# Patient Record
Sex: Female | Born: 1970 | Race: Black or African American | Hispanic: No | Marital: Single | Smoking: Never smoker
Health system: Southern US, Community
[De-identification: ages and names within clinical notes are randomized; demographics above are authoritative.]

## PROBLEM LIST (undated history)

## (undated) ENCOUNTER — Inpatient Hospital Stay (HOSPITAL_COMMUNITY): Payer: Self-pay

## (undated) DIAGNOSIS — J40 Bronchitis, not specified as acute or chronic: Secondary | ICD-10-CM

## (undated) DIAGNOSIS — D219 Benign neoplasm of connective and other soft tissue, unspecified: Secondary | ICD-10-CM

## (undated) DIAGNOSIS — G43909 Migraine, unspecified, not intractable, without status migrainosus: Secondary | ICD-10-CM

## (undated) DIAGNOSIS — D649 Anemia, unspecified: Secondary | ICD-10-CM

## (undated) DIAGNOSIS — M1990 Unspecified osteoarthritis, unspecified site: Secondary | ICD-10-CM

## (undated) DIAGNOSIS — I10 Essential (primary) hypertension: Secondary | ICD-10-CM

## (undated) DIAGNOSIS — IMO0002 Reserved for concepts with insufficient information to code with codable children: Secondary | ICD-10-CM

## (undated) DIAGNOSIS — K219 Gastro-esophageal reflux disease without esophagitis: Secondary | ICD-10-CM

## (undated) DIAGNOSIS — R87619 Unspecified abnormal cytological findings in specimens from cervix uteri: Secondary | ICD-10-CM

## (undated) DIAGNOSIS — J45909 Unspecified asthma, uncomplicated: Secondary | ICD-10-CM

## (undated) DIAGNOSIS — E119 Type 2 diabetes mellitus without complications: Secondary | ICD-10-CM

---

## 1998-04-04 ENCOUNTER — Emergency Department (HOSPITAL_COMMUNITY): Admission: EM | Admit: 1998-04-04 | Discharge: 1998-04-04 | Payer: Self-pay | Admitting: Emergency Medicine

## 1998-05-15 ENCOUNTER — Encounter: Admission: RE | Admit: 1998-05-15 | Discharge: 1998-08-03 | Payer: Self-pay | Admitting: *Deleted

## 1998-08-03 ENCOUNTER — Encounter: Admission: RE | Admit: 1998-08-03 | Discharge: 1998-11-01 | Payer: Self-pay | Admitting: *Deleted

## 1999-04-20 ENCOUNTER — Inpatient Hospital Stay (HOSPITAL_COMMUNITY): Admission: AD | Admit: 1999-04-20 | Discharge: 1999-04-20 | Payer: Self-pay | Admitting: *Deleted

## 1999-05-04 ENCOUNTER — Ambulatory Visit (HOSPITAL_COMMUNITY): Admission: RE | Admit: 1999-05-04 | Discharge: 1999-05-04 | Payer: Self-pay | Admitting: *Deleted

## 1999-05-19 ENCOUNTER — Inpatient Hospital Stay (HOSPITAL_COMMUNITY): Admission: AD | Admit: 1999-05-19 | Discharge: 1999-05-21 | Payer: Self-pay | Admitting: *Deleted

## 2000-10-29 ENCOUNTER — Emergency Department (HOSPITAL_COMMUNITY): Admission: EM | Admit: 2000-10-29 | Discharge: 2000-10-30 | Payer: Self-pay | Admitting: Emergency Medicine

## 2001-07-09 ENCOUNTER — Emergency Department (HOSPITAL_COMMUNITY): Admission: EM | Admit: 2001-07-09 | Discharge: 2001-07-09 | Payer: Self-pay | Admitting: Emergency Medicine

## 2001-08-14 ENCOUNTER — Emergency Department (HOSPITAL_COMMUNITY): Admission: EM | Admit: 2001-08-14 | Discharge: 2001-08-14 | Payer: Self-pay | Admitting: Emergency Medicine

## 2001-08-14 ENCOUNTER — Encounter: Payer: Self-pay | Admitting: Emergency Medicine

## 2001-09-27 ENCOUNTER — Observation Stay (HOSPITAL_COMMUNITY): Admission: EM | Admit: 2001-09-27 | Discharge: 2001-09-28 | Payer: Self-pay | Admitting: Emergency Medicine

## 2001-09-27 ENCOUNTER — Encounter: Payer: Self-pay | Admitting: Emergency Medicine

## 2001-09-27 ENCOUNTER — Encounter (INDEPENDENT_AMBULATORY_CARE_PROVIDER_SITE_OTHER): Payer: Self-pay

## 2001-09-27 HISTORY — PX: CHOLECYSTECTOMY: SHX55

## 2002-05-24 ENCOUNTER — Emergency Department (HOSPITAL_COMMUNITY): Admission: EM | Admit: 2002-05-24 | Discharge: 2002-05-24 | Payer: Self-pay | Admitting: Emergency Medicine

## 2002-07-29 ENCOUNTER — Emergency Department (HOSPITAL_COMMUNITY): Admission: EM | Admit: 2002-07-29 | Discharge: 2002-07-29 | Payer: Self-pay

## 2002-11-18 ENCOUNTER — Emergency Department (HOSPITAL_COMMUNITY): Admission: EM | Admit: 2002-11-18 | Discharge: 2002-11-18 | Payer: Self-pay | Admitting: Emergency Medicine

## 2003-01-29 ENCOUNTER — Emergency Department (HOSPITAL_COMMUNITY): Admission: EM | Admit: 2003-01-29 | Discharge: 2003-01-29 | Payer: Self-pay | Admitting: Emergency Medicine

## 2003-07-03 ENCOUNTER — Emergency Department (HOSPITAL_COMMUNITY): Admission: EM | Admit: 2003-07-03 | Discharge: 2003-07-04 | Payer: Self-pay | Admitting: Emergency Medicine

## 2003-08-01 ENCOUNTER — Emergency Department (HOSPITAL_COMMUNITY): Admission: EM | Admit: 2003-08-01 | Discharge: 2003-08-01 | Payer: Self-pay | Admitting: Emergency Medicine

## 2003-10-09 ENCOUNTER — Emergency Department (HOSPITAL_COMMUNITY): Admission: EM | Admit: 2003-10-09 | Discharge: 2003-10-09 | Payer: Self-pay | Admitting: Emergency Medicine

## 2004-05-22 ENCOUNTER — Emergency Department (HOSPITAL_COMMUNITY): Admission: EM | Admit: 2004-05-22 | Discharge: 2004-05-22 | Payer: Self-pay | Admitting: *Deleted

## 2004-06-12 ENCOUNTER — Emergency Department (HOSPITAL_COMMUNITY): Admission: EM | Admit: 2004-06-12 | Discharge: 2004-06-12 | Payer: Self-pay | Admitting: Emergency Medicine

## 2005-02-27 ENCOUNTER — Ambulatory Visit (HOSPITAL_COMMUNITY): Payer: Self-pay | Admitting: Psychiatry

## 2005-03-18 ENCOUNTER — Ambulatory Visit (HOSPITAL_COMMUNITY): Payer: Self-pay | Admitting: Psychiatry

## 2005-04-16 ENCOUNTER — Ambulatory Visit (HOSPITAL_COMMUNITY): Payer: Self-pay | Admitting: Psychiatry

## 2005-06-13 ENCOUNTER — Ambulatory Visit (HOSPITAL_COMMUNITY): Payer: Self-pay | Admitting: Psychiatry

## 2006-04-30 ENCOUNTER — Ambulatory Visit (HOSPITAL_COMMUNITY): Payer: Self-pay | Admitting: Psychiatry

## 2006-05-28 ENCOUNTER — Ambulatory Visit (HOSPITAL_COMMUNITY): Payer: Self-pay | Admitting: Psychiatry

## 2006-08-06 ENCOUNTER — Ambulatory Visit (HOSPITAL_COMMUNITY): Payer: Self-pay | Admitting: Psychiatry

## 2007-01-09 ENCOUNTER — Ambulatory Visit (HOSPITAL_COMMUNITY): Payer: Self-pay | Admitting: Psychiatry

## 2007-11-03 ENCOUNTER — Ambulatory Visit (HOSPITAL_COMMUNITY): Payer: Self-pay | Admitting: Psychiatry

## 2011-01-18 NOTE — Op Note (Signed)
[facility]  Patient:    MARIE, BOROWSKI Visit Number: [id number] MRN: [id number]          Service Type: OBV Location: 4W 0450 01 Attending Physician:  Charlton Haws Dictated by:   Currie Paris, M.D. Proc. Date: 09/27/01 Admit Date:  09/27/2001                             Operative Report  VISIT #[id number].  PREOPERATIVE DIAGNOSIS:  Acute calculus cholecystitis.  POSTOPERATIVE DIAGNOSIS:  Acute calculus cholecystitis.  OPERATION:  Laparoscopic cholecystectomy.  SURGEON:  Currie Paris, M.D.  ASSISTANT:  Sandria Bales. Ezzard Standing, M.D.  ANESTHESIA:  General endotracheal.  CLINICAL HISTORY:  This patient is a 40 year old who has presented with fairly classic symptoms of acute cholecystitis and an ultrasound showing a dilated gallbladder with multiple large stones. Liver functions were normal.  DESCRIPTION OF PROCEDURE:  The patient was brought to the operating room and after satisfactory general endotracheal anesthesia had been obtained, the abdomen was prepped and draped. I used 0.25% Marcaine in each incision site. The umbilical incision was made first and the fascia identified, opened and the peritoneal cavity entered under direct vision. A pursestring was placed and the Hasson introduced. The abdomen was insufflated to 15. The gallbladder was noted to be tense and inflamed and edematous. It was actually intrahepatic with a portion of the very dome of the gallbladder protruding through the liver with a band of liver going around the gallbladder below this. I decompressed the gallbladder, for better retraction, with a needle.  The gallbladder was retracted and I was able to dissect some omental adhesions off of the entire length of the gallbladder and get down to the triangle of Calot. I opened the triangle of Calot both anteriorly and posteriorly, dissected that out so that I could thoroughly identify the anatomy and see  the cystic artery and the cystic duct coming up and could see where it entered the gallbladder and also dissected down a little further to see the common duct area. Once the anatomy was clear and I had a good window into the triangle of Calot, I put three clips on the stay side of the cystic duct and one on the go side and divided it. The cystic artery was divided and a posterior branch clipped and divided as well. The gallbladder was removed from below to above and this was something of a struggle because of the intrahepatic nature of the gallbladder. As you go towards the dome, I had to divide the band of liver that came across the gallbladder using cautery and then killed the gallbladder off with this but eventually was able to get the gallbladder completely disconnected and placed in a bag. I then irrigated and we cauterized vigorously the area of the liver where we had to divide it as that was oozing a fair amount initially but got that all stopped without any significant blood loss. The remainder of the gallbladder bed appeared dry and we irrigated for several minutes. I went ahead at this point and brought the gallbladder up the umbilical port and had to enlarge that a little bit to get the stones to come through the port site and then reintroduce the Hasson. No blood had accumulated and everything appeared to be dry. I irrigated a final time, placed some Surgicel around the edges of the liver at the top where the bleeding had occurred and left a  19 Blake drain in case we had any subsequent bile leak or bleeding. This was brought out the lateral port.  After suctioning out the remaining irrigant, the lateral port was removed. The umbilical port was removed and the pursestring tied down. The abdomen was deflated as I removed the epigastric port. The skin was closed with 4-0 Monocryl subcuticular plus Steri-Strips. The patient tolerated the procedure well. There were no operative  complications. All counts were correct. Dictated by:   Currie Paris, M.D. Attending Physician:  Charlton Haws DD:  09/27/01 TD:  09/27/01 Job: [phone] [id number]

## 2011-01-18 NOTE — H&P (Signed)
[facility]  Patient:    Carla Brooks, EISEN Visit Number: [id number] MRN: [id number]          Service Type: OBV Location: 4W 0450 01 Attending Physician:  Charlton Haws Dictated by:   Currie Paris, M.D. Admit Date:  09/27/2001                           History and Physical  VISIT #:  [id number]  CHIEF COMPLAINT:  Abdominal pain.  HISTORY OF PRESENT ILLNESS:  Carla Brooks is a 40 year old lady who presented with acute abdominal pain early this morning.  She had eaten some Beefaroni around 8-9 oclock and about two hours later developed epigastric right upper quadrant pain, nausea, vomiting, and the pain has continued all night.  She had a similar episode two to three days earlier which resolved without incident.  She has never had any other significant GI complaints.  She has otherwise been in generally good health.  MEDICATIONS:  Albuterol inhalers.  ALLERGIES:  IBUPROFEN sensitivity because it caused an ulcer.  HABITS:  Cigarettes, none.  Alcohol, none.  FAMILY HISTORY:  Unremarkable.  REVIEW OF SYSTEMS:  HEENT:  Negative.  CHEST:  Has a history of bronchitis, using inhalers.  HEART:  No history of hypertension, etc.  ABDOMEN:  Negative except for HPI.  GENITOURINARY:  Negative.  Has had two children, ages 22 and 2 currently.  PHYSICAL EXAMINATION:  GENERAL:  Generally healthy but somewhat uncomfortable-appearing female.  HEENT:  Head normocephalic.  Eyes nonicteric.  Pupils round and regular.  NECK:  Supple.  No masses or thyromegaly.  LUNGS:  Clear to auscultation.  HEART:  Regular.  No murmurs, rubs, or gallops.  ABDOMEN:  Soft generally, but markedly tender in the right upper quadrant, with no rebound tenderness noted.  Bowel sounds are present.  EXTREMITIES:  No cyanosis or edema.  LABORATORY DATA:  Gallbladder ultrasound shows large gallstones, dilated gallbladder.  Other laboratory studies show a negative urine  pregnancy test.  Clear urine, although her specific gravity is 1.040, indicating some hemoconcentration. Her white count is 14.5, with a hemoglobin of 10.6.  Amylase is normal at 76. Liver functions and electrolytes are also normal.  Lipase is normal at 23.  IMPRESSION:  Acute cholecystitis with gallstones.  RECOMMENDATIONS:  We are going to go ahead and give her some antibiotics, but we need to, I believe, proceed to laparoscopic cholecystectomy with possible open, depending on the findings.  I have gone over this with the patient and her mother.  All questions have been answered, and we will go ahead and try to get her scheduled this morning. Dictated by:   Currie Paris, M.D. Attending Physician:  Charlton Haws DD:  09/27/01 TD:  09/27/01 Job: [id number]

## 2011-10-03 ENCOUNTER — Emergency Department (HOSPITAL_COMMUNITY): Payer: Medicaid Other

## 2011-10-03 ENCOUNTER — Encounter (HOSPITAL_COMMUNITY): Payer: Self-pay | Admitting: *Deleted

## 2011-10-03 ENCOUNTER — Emergency Department (HOSPITAL_COMMUNITY)
Admission: EM | Admit: 2011-10-03 | Discharge: 2011-10-03 | Disposition: A | Discharge status: Home / Self Care | Payer: Medicaid Other | Attending: Emergency Medicine | Admitting: Emergency Medicine

## 2011-10-03 ENCOUNTER — Other Ambulatory Visit: Payer: Self-pay

## 2011-10-03 DIAGNOSIS — R0602 Shortness of breath: Secondary | ICD-10-CM | POA: Insufficient documentation

## 2011-10-03 DIAGNOSIS — R05 Cough: Secondary | ICD-10-CM | POA: Insufficient documentation

## 2011-10-03 DIAGNOSIS — R079 Chest pain, unspecified: Secondary | ICD-10-CM | POA: Insufficient documentation

## 2011-10-03 DIAGNOSIS — I10 Essential (primary) hypertension: Secondary | ICD-10-CM | POA: Insufficient documentation

## 2011-10-03 DIAGNOSIS — R059 Cough, unspecified: Secondary | ICD-10-CM | POA: Insufficient documentation

## 2011-10-03 DIAGNOSIS — E119 Type 2 diabetes mellitus without complications: Secondary | ICD-10-CM | POA: Insufficient documentation

## 2011-10-03 DIAGNOSIS — R0789 Other chest pain: Secondary | ICD-10-CM

## 2011-10-03 HISTORY — DX: Bronchitis, not specified as acute or chronic: J40

## 2011-10-03 HISTORY — DX: Essential (primary) hypertension: I10

## 2011-10-03 LAB — POCT I-STAT, CHEM 8
Calcium, Ion: 1.15 mmol/L (ref 1.12–1.32)
Glucose, Bld: 91 mg/dL (ref 70–99)
HCT: 30 % — ABNORMAL LOW (ref 36.0–46.0)
Hemoglobin: 10.2 g/dL — ABNORMAL LOW (ref 12.0–15.0)
Potassium: 3.5 mEq/L (ref 3.5–5.1)
TCO2: 23 mmol/L (ref 0–100)

## 2011-10-03 LAB — POCT I-STAT TROPONIN I: Troponin i, poc: 0 ng/mL (ref 0.00–0.08)

## 2011-10-03 LAB — D-DIMER, QUANTITATIVE: D-Dimer, Quant: 0.33 ug/mL-FEU (ref 0.00–0.48)

## 2011-10-03 NOTE — ED Provider Notes (Addendum)
History     CSN: [id number]  Arrival date & time 10/03/11  1726   First MD Initiated Contact with Patient 10/03/11 1743      Chief Complaint  Patient presents with  . Chest Pain    (Consider location/radiation/quality/duration/timing/severity/associated sxs/prior treatment) HPI Complains of anterior chest pain onset 2 days ago, improved by taking deep breath intermittent lasting 2 minutes at a time not made worse by exertion. Admits to mild shortness of breath denies nausea or sweatiness seen [facility] sent here for further evaluation. No treatment prior to coming here. No other associated symptoms. Symptoms are relieved by sitting up made worse by lying supine. Admits to mild cough no fever no other complaint Past Medical History  Diagnosis Date  . Diabetes mellitus   . Hypertension   . Bronchitis    cardiac risk factors diabetes hypertension otherwise negative  Past Surgical History  Procedure Date  . Cholecystectomy     No family history on file.  History  Substance Use Topics  . Smoking status: Never Smoker   . Smokeless tobacco: Not on file  . Alcohol Use: No    OB History    Grav Para Term Preterm Abortions TAB SAB Ect Mult Living                  Review of Systems  Constitutional: Negative.   HENT: Negative.   Respiratory: Positive for cough and shortness of breath.   Cardiovascular: Positive for chest pain.  Gastrointestinal: Negative.   Musculoskeletal: Negative.   Skin: Negative.   Neurological: Negative.   Hematological: Negative.   Psychiatric/Behavioral: Negative.     Allergies  Penicillins  Home Medications   Current Outpatient Rx  Name Route Sig Dispense Refill  . DIVALPROEX SODIUM 250 MG PO TBEC Oral Take 500 mg by mouth every evening.     Marland Kitchen LISINOPRIL 10 MG PO TABS Oral Take 10 mg by mouth every evening.    Marland Kitchen METFORMIN HCL 500 MG PO TABS Oral Take 1,000 mg by mouth 2 (two) times daily with a meal.     .  SERTRALINE HCL 100 MG PO TABS Oral Take 100 mg by mouth every evening.    . TOPIRAMATE 100 MG PO TABS Oral Take 100 mg by mouth every evening.    . TRAZODONE HCL 100 MG PO TABS Oral Take 100 mg by mouth at bedtime.      BP 171/85  Temp(Src) 98.4 F (36.9 C) (Oral)  Resp 15  Ht 5\' 9"  (1.753 m)  Wt 218 lb (98.884 kg)  BMI 32.19 kg/m2  SpO2 100%  LMP 09/26/2011  Physical Exam  Nursing note and vitals reviewed. Constitutional: She appears well-developed and well-nourished.  HENT:  Head: Normocephalic and atraumatic.  Eyes: Conjunctivae are normal. Pupils are equal, round, and reactive to light.  Neck: Neck supple. No tracheal deviation present. No thyromegaly present.  Cardiovascular: Normal rate, regular rhythm and normal heart sounds.  Exam reveals no friction rub.   No murmur heard. Pulmonary/Chest: Effort normal and breath sounds normal.  Abdominal: Soft. Bowel sounds are normal. She exhibits no distension. There is no tenderness.       Mildly obese  Musculoskeletal: Normal range of motion. She exhibits no edema and no tenderness.  Neurological: She is alert. Coordination normal.  Skin: Skin is warm and dry. No rash noted.  Psychiatric: She has a normal mood and affect.    ED Course  Procedures (including critical care  time) 11 PM patient resting comfortably asymptomatic [facility] will evaluate patient in the office as an outpatient Labs Reviewed - No data to display No results found.  Date: 10/03/2011  Rate: 75  Rhythm: normal sinus rhythm  QRS Axis: normal  Intervals: normal  ST/T Wave abnormalities: normal  Conduction Disutrbances:none  Narrative Interpretation:   Old EKG Reviewed: unchanged No significant change from EKG performed today at 1659 pm  No diagnosis found.    MDM  Feel the patient is very low risk for acute coronary syndrome demonstrating female normal cardiac markers x2 sets highly atypical symptoms. Low pretest  probability for pulmonary embolism, negative d-dimer Diagnosis #1 atypical chest pain #2 hypertension        Doug Sou, MD 10/03/11 1610  Doug Sou, MD 10/03/11 2307

## 2011-10-03 NOTE — ED Notes (Signed)
Pt reports central chest pain x1 week intermittently. Feels like pressure. Reports associated feeling of "having the wind knocked out of me." Denies n/v/diaphoresis. Seen by Mila Palmer, MD at [facility] today and sent here for eval of ekg changes. No chest pain presently. When pain comes rates 8/10, sts relief after a few minutes after standing up. Pain only happens when sitting or lying down. Pt sts needing 2 pillows propped up in order to sleep at night.

## 2011-10-09 ENCOUNTER — Emergency Department (HOSPITAL_COMMUNITY)
Admission: EM | Admit: 2011-10-09 | Discharge: 2011-10-09 | Disposition: A | Discharge status: Home / Self Care | Payer: Medicaid Other | Attending: Emergency Medicine | Admitting: Emergency Medicine

## 2011-10-09 ENCOUNTER — Other Ambulatory Visit: Payer: Self-pay

## 2011-10-09 ENCOUNTER — Encounter (HOSPITAL_COMMUNITY): Payer: Self-pay

## 2011-10-09 ENCOUNTER — Emergency Department (HOSPITAL_COMMUNITY): Payer: Medicaid Other

## 2011-10-09 DIAGNOSIS — E119 Type 2 diabetes mellitus without complications: Secondary | ICD-10-CM | POA: Insufficient documentation

## 2011-10-09 DIAGNOSIS — R0602 Shortness of breath: Secondary | ICD-10-CM | POA: Insufficient documentation

## 2011-10-09 DIAGNOSIS — R61 Generalized hyperhidrosis: Secondary | ICD-10-CM | POA: Insufficient documentation

## 2011-10-09 DIAGNOSIS — R05 Cough: Secondary | ICD-10-CM | POA: Insufficient documentation

## 2011-10-09 DIAGNOSIS — R079 Chest pain, unspecified: Secondary | ICD-10-CM | POA: Insufficient documentation

## 2011-10-09 DIAGNOSIS — R059 Cough, unspecified: Secondary | ICD-10-CM | POA: Insufficient documentation

## 2011-10-09 DIAGNOSIS — M546 Pain in thoracic spine: Secondary | ICD-10-CM | POA: Insufficient documentation

## 2011-10-09 DIAGNOSIS — I10 Essential (primary) hypertension: Secondary | ICD-10-CM | POA: Insufficient documentation

## 2011-10-09 LAB — CBC
HCT: 27.9 % — ABNORMAL LOW (ref 36.0–46.0)
Hemoglobin: 9.2 g/dL — ABNORMAL LOW (ref 12.0–15.0)
MCH: 29 pg (ref 26.0–34.0)
MCHC: 33 g/dL (ref 30.0–36.0)
MCV: 88 fL (ref 78.0–100.0)

## 2011-10-09 LAB — BASIC METABOLIC PANEL
BUN: 8 mg/dL (ref 6–23)
Creatinine, Ser: 0.77 mg/dL (ref 0.50–1.10)
GFR calc Af Amer: >90 mL/min (ref >90)
GFR calc non Af Amer: >90 mL/min (ref >90)
Glucose, Bld: 93 mg/dL (ref 70–99)

## 2011-10-09 LAB — DIFFERENTIAL
Basophils Relative: 0 % (ref 0–1)
Eosinophils Absolute: 0.1 10*3/uL (ref 0.0–0.7)
Eosinophils Relative: 1 % (ref 0–5)
Monocytes Absolute: 0.7 10*3/uL (ref 0.1–1.0)
Monocytes Relative: 9 % (ref 3–12)

## 2011-10-09 MED ORDER — IBUPROFEN 600 MG PO TABS: 600.0000 mg | ORAL_TABLET | Freq: Four times a day (QID) | ORAL | Status: AC

## 2011-10-09 MED ORDER — KETOROLAC TROMETHAMINE 30 MG/ML IJ SOLN
30.0000 mg | Freq: Once | INTRAMUSCULAR | Status: AC
  Administered 2011-10-09: 30 mg via INTRAVENOUS
  Filled 2011-10-09: qty 1

## 2011-10-09 MED ORDER — OMEPRAZOLE 20 MG PO CPDR: 20.0000 mg | DELAYED_RELEASE_CAPSULE | Freq: Every day | ORAL | Status: DC

## 2011-10-09 MED ORDER — GI COCKTAIL ~~LOC~~
30.0000 mL | Freq: Once | ORAL | Status: AC
  Administered 2011-10-09: 30 mL via ORAL
  Filled 2011-10-09 (×2): qty 30

## 2011-10-09 MED ORDER — AZITHROMYCIN 250 MG PO TABS: 250.0000 mg | ORAL_TABLET | Freq: Every day | ORAL | Status: AC

## 2011-10-09 NOTE — ED Notes (Signed)
EKG done on arrival and given to Dr Rancour with old EKG. 

## 2011-10-09 NOTE — ED Provider Notes (Signed)
History     CSN: [id number]  Arrival date & time 10/09/11  1925   First MD Initiated Contact with Patient 10/09/11 1929      Chief Complaint  Patient presents with  . Chest Pain    (Consider location/radiation/quality/duration/timing/severity/associated sxs/prior treatment) Patient is a 41 y.o. female presenting with chest pain. The history is provided by the patient.  Chest Pain Episode onset: 2 weeks ago. Episode Length: initially lasted several minutes at a time, but today has been constant. Chest pain occurs constantly. The chest pain is worsening. The pain is associated with breathing. The pain is currently at 9/10. The severity of the pain is severe. The quality of the pain is described as pressure-like. The pain radiates to the upper back. Chest pain is worsened by certain positions and exertion (laying flat). Primary symptoms include shortness of breath and cough. Pertinent negatives for primary symptoms include no fever, no abdominal pain, no nausea and no vomiting.  The shortness of breath developed with exertion.  Associated symptoms include diaphoresis.  Pertinent negatives for associated symptoms include no lower extremity edema.  Her past medical history is significant for diabetes and hypertension.  Pertinent negatives for past medical history include no DVT and no PE.     Past Medical History  Diagnosis Date  . Diabetes mellitus   . Hypertension   . Bronchitis     Past Surgical History  Procedure Date  . Cholecystectomy     History reviewed. No pertinent family history.  History  Substance Use Topics  . Smoking status: Never Smoker   . Smokeless tobacco: Not on file  . Alcohol Use: No    OB History    Grav Para Term Preterm Abortions TAB SAB Ect Mult Living                  Review of Systems  Constitutional: Positive for chills and diaphoresis. Negative for fever.  HENT: Positive for congestion and sore throat.   Respiratory: Positive for cough  and shortness of breath.   Cardiovascular: Positive for chest pain.  Gastrointestinal: Negative for nausea, vomiting, abdominal pain and diarrhea.  Genitourinary: Negative for difficulty urinating.  All other systems reviewed and are negative.    Allergies  Penicillins  Home Medications   Current Outpatient Rx  Name Route Sig Dispense Refill  . ASPIRIN-ACETAMINOPHEN-CAFFEINE 250-250-65 MG PO TABS Oral Take 1 tablet by mouth every 6 (six) hours as needed. For migraines    . DIVALPROEX SODIUM ER 500 MG PO TB24 Oral Take 1,000 mg by mouth at bedtime.    Marland Kitchen LISINOPRIL 10 MG PO TABS Oral Take 10 mg by mouth every evening.    Marland Kitchen METFORMIN HCL 500 MG PO TABS Oral Take 2,000 mg by mouth at bedtime.     . SERTRALINE HCL 100 MG PO TABS Oral Take 150 mg by mouth every evening.     . TOPIRAMATE 100 MG PO TABS Oral Take 100 mg by mouth every evening.    . TRAZODONE HCL 100 MG PO TABS Oral Take 25-100 mg by mouth 2 (two) times daily as needed. Take .25 tablet daily for anxiety and 0.5-1 tablet at bedtime      BP 158/92  Pulse 110  Temp(Src) 100.9 F (38.3 C) (Oral)  Resp 22  SpO2 99%  LMP 09/26/2011  Physical Exam  Nursing note and vitals reviewed. Constitutional: She is oriented to person, place, and time. She appears well-developed and well-nourished. No distress.  HENT:  Head: Normocephalic and atraumatic.  Mouth/Throat: Oropharynx is clear and moist.  Eyes: Conjunctivae are normal. Pupils are equal, round, and reactive to light. No scleral icterus.  Neck: Neck supple.  Cardiovascular: Normal rate, regular rhythm, normal heart sounds and intact distal pulses.   No murmur heard. Pulmonary/Chest: Effort normal and breath sounds normal. No stridor. No respiratory distress. She has no rales.  Abdominal: Soft. Bowel sounds are normal. She exhibits no distension. There is no tenderness.  Musculoskeletal: Normal range of motion. She exhibits no edema and no tenderness.  Neurological: She is  alert and oriented to person, place, and time.  Skin: Skin is warm and dry. No rash noted.  Psychiatric: She has a normal mood and affect. Her behavior is normal.    ED Course  Procedures (including critical care time)  Labs Reviewed  CBC - Abnormal; Notable for the following:    RBC 3.17 (*)    Hemoglobin 9.2 (*)    HCT 27.9 (*)    All other components within normal limits  BASIC METABOLIC PANEL - Abnormal; Notable for the following:    Potassium 3.3 (*)    All other components within normal limits  DIFFERENTIAL  D-DIMER, QUANTITATIVE  TROPONIN I   Dg Chest 2 View  10/09/2011  *RADIOLOGY REPORT*  Clinical Data: Chest pain, shortness of breath  CHEST - 2 VIEW  Comparison: 10/03/2011  Findings: Vascular clips in the right upper abdomen. Lungs clear. Heart size and pulmonary vascularity normal.  No effusion. Visualized bones unremarkable.  IMPRESSION: No acute disease  Original Report Authenticated By: Osa Craver, M.D.    Date: 10/10/2011  Rate: 100  Rhythm: normal sinus rhythm  QRS Axis: normal  Intervals: normal  ST/T Wave abnormalities: normal  Conduction Disutrbances:none  Narrative Interpretation:   Old EKG Reviewed: unchanged     1. Chest pain       MDM  41 yo female with onset of chest pain two weeks ago.  Initially was intermittent, today is constant.  Worse with laying flat, better sitting up.  Associated with shortness of breath and DOE.  VSS, well appearing.  She also developed nasal congestion and sore throat since yesterday.  Is febrile on arrival, likely from newly developed URI.  She endorses a cough for the past month, without hemoptysis.  She was evaluated for her chest pain one week ago at [facility], at which time she had two negative sets of cardiac enzymes and a negative d-dimer.  At that time, it was felt that ACS and PE were both very unlikely.  D-dimer repeated and again negative.  Feel that ACS and PE very unlikely.  Story and exam not  consistent with dissection.  Bedside ultrasound revealed no pericardial effusion.  Given fever and cough for past month, have prescribed azithromycin for likely bronchitis.  DC'd home with PCP follow up.          Warnell Forester, MD 10/10/11 [place]

## 2011-10-09 NOTE — ED Notes (Addendum)
Pt is here for chest pains that started last Thursday. This pain radiates to her upper back. Pt is also co some sob and nausea with this pain. This is worse today than before. Pt pain is 8/10. Was given asa 324 and 2 nitros and pain is now 6/10. Pain is described as pressure/heaviness.  #20 in L ac.

## 2011-10-10 NOTE — ED Provider Notes (Signed)
I saw and evaluated the patient, reviewed the resident's note and I agree with the findings and plan.  Intermittent substernal chest pain for the past 2 weeks. Constant pain today it worsens with lying flat. Seen at [facility] on January 31 with similar symptoms.  No SOB or cough.  Febrile here.  Lungs clear.  Ddimer neg.   Glynn Octave, MD 10/10/11 [phone]

## 2011-10-11 ENCOUNTER — Encounter: Payer: Medicaid Other | Admitting: Physician Assistant

## 2011-11-15 ENCOUNTER — Emergency Department (HOSPITAL_COMMUNITY)
Admission: EM | Admit: 2011-11-15 | Discharge: 2011-11-15 | Disposition: A | Discharge status: Home / Self Care | Payer: Medicaid Other | Attending: Emergency Medicine | Admitting: Emergency Medicine

## 2011-11-15 ENCOUNTER — Emergency Department (HOSPITAL_COMMUNITY): Payer: Medicaid Other

## 2011-11-15 ENCOUNTER — Encounter (HOSPITAL_COMMUNITY): Payer: Self-pay | Admitting: Emergency Medicine

## 2011-11-15 DIAGNOSIS — K219 Gastro-esophageal reflux disease without esophagitis: Secondary | ICD-10-CM | POA: Insufficient documentation

## 2011-11-15 DIAGNOSIS — I10 Essential (primary) hypertension: Secondary | ICD-10-CM | POA: Insufficient documentation

## 2011-11-15 DIAGNOSIS — R059 Cough, unspecified: Secondary | ICD-10-CM | POA: Insufficient documentation

## 2011-11-15 DIAGNOSIS — E860 Dehydration: Secondary | ICD-10-CM | POA: Insufficient documentation

## 2011-11-15 DIAGNOSIS — R05 Cough: Secondary | ICD-10-CM | POA: Insufficient documentation

## 2011-11-15 DIAGNOSIS — R6883 Chills (without fever): Secondary | ICD-10-CM | POA: Insufficient documentation

## 2011-11-15 DIAGNOSIS — Z794 Long term (current) use of insulin: Secondary | ICD-10-CM | POA: Insufficient documentation

## 2011-11-15 DIAGNOSIS — M129 Arthropathy, unspecified: Secondary | ICD-10-CM | POA: Insufficient documentation

## 2011-11-15 DIAGNOSIS — R197 Diarrhea, unspecified: Secondary | ICD-10-CM | POA: Insufficient documentation

## 2011-11-15 DIAGNOSIS — Z79899 Other long term (current) drug therapy: Secondary | ICD-10-CM | POA: Insufficient documentation

## 2011-11-15 DIAGNOSIS — K529 Noninfective gastroenteritis and colitis, unspecified: Secondary | ICD-10-CM

## 2011-11-15 DIAGNOSIS — R509 Fever, unspecified: Secondary | ICD-10-CM | POA: Insufficient documentation

## 2011-11-15 DIAGNOSIS — R10819 Abdominal tenderness, unspecified site: Secondary | ICD-10-CM | POA: Insufficient documentation

## 2011-11-15 DIAGNOSIS — R5381 Other malaise: Secondary | ICD-10-CM | POA: Insufficient documentation

## 2011-11-15 DIAGNOSIS — R112 Nausea with vomiting, unspecified: Secondary | ICD-10-CM | POA: Insufficient documentation

## 2011-11-15 DIAGNOSIS — K5289 Other specified noninfective gastroenteritis and colitis: Secondary | ICD-10-CM | POA: Insufficient documentation

## 2011-11-15 DIAGNOSIS — R1110 Vomiting, unspecified: Secondary | ICD-10-CM | POA: Insufficient documentation

## 2011-11-15 DIAGNOSIS — E119 Type 2 diabetes mellitus without complications: Secondary | ICD-10-CM | POA: Insufficient documentation

## 2011-11-15 HISTORY — DX: Gastro-esophageal reflux disease without esophagitis: K21.9

## 2011-11-15 HISTORY — DX: Unspecified osteoarthritis, unspecified site: M19.90

## 2011-11-15 LAB — URINALYSIS, ROUTINE W REFLEX MICROSCOPIC
Glucose, UA: NEGATIVE mg/dL
Protein, ur: 100 mg/dL — AB
Urobilinogen, UA: 0.2 mg/dL (ref 0.0–1.0)

## 2011-11-15 LAB — DIFFERENTIAL
Basophils Absolute: 0 10*3/uL (ref 0.0–0.1)
Basophils Relative: 0 % (ref 0–1)
Eosinophils Absolute: 0.1 10*3/uL (ref 0.0–0.7)
Eosinophils Relative: 0 % (ref 0–5)
Monocytes Absolute: 1 10*3/uL (ref 0.1–1.0)

## 2011-11-15 LAB — CBC
HCT: 35.9 % — ABNORMAL LOW (ref 36.0–46.0)
MCH: 27.7 pg (ref 26.0–34.0)
MCHC: 32.6 g/dL (ref 30.0–36.0)
MCV: 84.9 fL (ref 78.0–100.0)
RDW: 13.9 % (ref 11.5–15.5)

## 2011-11-15 LAB — POCT I-STAT, CHEM 8
BUN: 26 mg/dL — ABNORMAL HIGH (ref 6–23)
Calcium, Ion: 1.19 mmol/L (ref 1.12–1.32)
Chloride: 112 mEq/L (ref 96–112)
Creatinine, Ser: 1.4 mg/dL — ABNORMAL HIGH (ref 0.50–1.10)
Glucose, Bld: 84 mg/dL (ref 70–99)
TCO2: 19 mmol/L (ref 0–100)

## 2011-11-15 LAB — URINE MICROSCOPIC-ADD ON

## 2011-11-15 LAB — COMPREHENSIVE METABOLIC PANEL
AST: 25 U/L (ref 0–37)
CO2: 17 mEq/L — ABNORMAL LOW (ref 19–32)
Calcium: 10 mg/dL (ref 8.4–10.5)
Creatinine, Ser: 1.74 mg/dL — ABNORMAL HIGH (ref 0.50–1.10)
GFR calc non Af Amer: 36 mL/min — ABNORMAL LOW (ref >90)
Total Protein: 9 g/dL — ABNORMAL HIGH (ref 6.0–8.3)

## 2011-11-15 LAB — PREGNANCY, URINE: Preg Test, Ur: NEGATIVE

## 2011-11-15 MED ORDER — ONDANSETRON HCL 4 MG/2ML IJ SOLN
4.0000 mg | Freq: Once | INTRAMUSCULAR | Status: AC
  Administered 2011-11-15: 4 mg via INTRAVENOUS
  Filled 2011-11-15: qty 2

## 2011-11-15 MED ORDER — SODIUM CHLORIDE 0.9 % IV BOLUS (SEPSIS)
1000.0000 mL | Freq: Once | INTRAVENOUS | Status: AC
  Administered 2011-11-15: 1000 mL via INTRAVENOUS

## 2011-11-15 MED ORDER — ONDANSETRON HCL 8 MG PO TABS: 8.0000 mg | ORAL_TABLET | Freq: Three times a day (TID) | ORAL | Status: AC | PRN

## 2011-11-15 MED ORDER — POTASSIUM CHLORIDE 10 MEQ/100ML IV SOLN
10.0000 meq | Freq: Once | INTRAVENOUS | Status: AC
  Administered 2011-11-15: 10 meq via INTRAVENOUS
  Filled 2011-11-15: qty 100

## 2011-11-15 MED ORDER — ONDANSETRON HCL 4 MG/2ML IJ SOLN: 4.0000 mg | Freq: Four times a day (QID) | INTRAMUSCULAR | Status: DC | PRN

## 2011-11-15 MED ORDER — MORPHINE SULFATE 2 MG/ML IJ SOLN: 2.0000 mg | INTRAMUSCULAR | Status: DC | PRN

## 2011-11-15 MED ORDER — ALBUTEROL SULFATE (5 MG/ML) 0.5% IN NEBU
5.0000 mg | INHALATION_SOLUTION | Freq: Once | RESPIRATORY_TRACT | Status: AC
  Administered 2011-11-15: 5 mg via RESPIRATORY_TRACT
  Filled 2011-11-15: qty 1

## 2011-11-15 MED ORDER — POTASSIUM CHLORIDE CRYS ER 20 MEQ PO TBCR
40.0000 meq | EXTENDED_RELEASE_TABLET | Freq: Once | ORAL | Status: AC
  Administered 2011-11-15: 40 meq via ORAL
  Filled 2011-11-15: qty 2

## 2011-11-15 MED ORDER — POTASSIUM CHLORIDE CRYS ER 20 MEQ PO TBCR: 20.0000 meq | EXTENDED_RELEASE_TABLET | Freq: Two times a day (BID) | ORAL | Status: DC

## 2011-11-15 MED ORDER — SODIUM CHLORIDE 0.9 % IV SOLN
INTRAVENOUS | Status: DC
  Administered 2011-11-15 (×3): via INTRAVENOUS

## 2011-11-15 MED ORDER — HYDROCODONE-ACETAMINOPHEN 5-325 MG PO TABS: 1.0000 | ORAL_TABLET | ORAL | Status: AC | PRN

## 2011-11-15 MED ORDER — IPRATROPIUM BROMIDE 0.02 % IN SOLN
0.5000 mg | Freq: Once | RESPIRATORY_TRACT | Status: AC
  Administered 2011-11-15: 0.5 mg via RESPIRATORY_TRACT
  Filled 2011-11-15: qty 2.5

## 2011-11-15 NOTE — ED Notes (Signed)
Unable to ortho vitalsigns  Standing pt. Is to weak to stand.very dizzy

## 2011-11-15 NOTE — ED Provider Notes (Signed)
Pt on CDU dehydration protocol.  Currently asymptomatic w/ exception of feeling a little shaky.  VSS, heart w/ RRR, lungs clear, abd benign but diffusely, moderately tender.  Will recheck orthostatics, I-Stat Chem and po challenge.  4:44 PM   Pt is no longer orthostatic.  She is tolerating po fluids.  BUN and Cr are improved.  Potassium stable at 3.1.  Will give a second dose of IV KCl + 40po and then d/c home w/ potassium, hydrocodone and zofran.  Return precautions discussed. 5:57 PM   Otilio Miu, PA 11/16/11 1002

## 2011-11-15 NOTE — ED Provider Notes (Signed)
Pt to the CDU on dehydration protocol. Pt with recent pneumonia from which she has recovered with onset N/V/D and generalized abd pain in the last week. BUN and SCr elevated. Mild hypokalemia on BMP.  On my assessment, the patient is awake, alert, oriented and in no distress. Mucous membranes are moist. Lungs are clear to auscultation bilaterally. Heart regular rate and rhythm. Abdomen is soft, mildly tender periumbilical region with normal bowel sounds. Moves all extremity as well. Normal spoken voice.  Additional fluid bolus is ordered. Patient is attempting ice chips at this time. We'll continue to monitor.    3:58 PM Report given to PA schinlever, who will continue to monitor.  Shaaron Adler, [place] 11/15/11 1559

## 2011-11-15 NOTE — ED Notes (Signed)
Patient declined in and out cath, is willing to give another specimen

## 2011-11-15 NOTE — ED Notes (Signed)
Family at bedside. 

## 2011-11-15 NOTE — ED Provider Notes (Signed)
History     CSN: [id number]  Arrival date & time 11/15/11  4098   First MD Initiated Contact with Patient 11/15/11 0825      Chief Complaint  Patient presents with  . Emesis    (Consider location/radiation/quality/duration/timing/severity/associated sxs/prior treatment) Patient is a 41 y.o. female presenting with vomiting. The history is provided by the patient.  Emesis  This is a new problem. The current episode started more than 1 week ago. The problem occurs 5 to 10 times per day. The problem has not changed since onset.The emesis has an appearance of stomach contents. Associated symptoms include chills, cough, diarrhea and a fever. Pertinent negatives include no abdominal pain.  Pt states she has had cough for over a month. Diagnosed with pneumonia. Finished her antibiotic. Still coughing, went to UC 1week ago, was told she still had pneumonia, but was told she did not need any more antibiotics, and was told to just take robitussin, according to her. She states about the same time, she began having nausea, vomiting, diarrhea that still persists. Worsened yesterday. Pt states she is unable to keep anything down since yesterday.  Reports pain to the upper mid abdomen, that she describes as "a knot." States she cant keep any of her medications down either. Reports subjective fevers, chills.   Past Medical History  Diagnosis Date  . Diabetes mellitus   . Hypertension   . Bronchitis   . Arthritis   . GERD (gastroesophageal reflux disease)     Past Surgical History  Procedure Date  . Cholecystectomy     History reviewed. No pertinent family history.  History  Substance Use Topics  . Smoking status: Never Smoker   . Smokeless tobacco: Not on file  . Alcohol Use: No    OB History    Grav Para Term Preterm Abortions TAB SAB Ect Mult Living                  Review of Systems  Constitutional: Positive for fever, chills and fatigue.  HENT: Negative for congestion, sore  throat, neck pain and neck stiffness.   Eyes: Negative.   Respiratory: Positive for cough and shortness of breath. Negative for wheezing.   Cardiovascular: Negative for chest pain, palpitations and leg swelling.  Gastrointestinal: Positive for nausea, vomiting and diarrhea. Negative for abdominal pain and blood in stool.  Genitourinary: Negative.   Musculoskeletal: Negative.   Skin: Negative.   Neurological: Negative.   Psychiatric/Behavioral: Negative.     Allergies  Penicillins  Home Medications   Current Outpatient Rx  Name Route Sig Dispense Refill  . ASPIRIN-ACETAMINOPHEN-CAFFEINE 250-250-65 MG PO TABS Oral Take 1 tablet by mouth every 6 (six) hours as needed. For migraines    . DIVALPROEX SODIUM ER 500 MG PO TB24 Oral Take 1,000 mg by mouth at bedtime.    . IBUPROFEN 600 MG PO TABS Oral Take 600 mg by mouth every 6 (six) hours as needed. pain    . INSULIN ASPART 100 UNIT/ML [place] SOLN Subcutaneous Inject 4-15 Units into the skin 3 (three) times daily before meals. Sliding scale insulin    . LISINOPRIL-HYDROCHLOROTHIAZIDE 10-12.5 MG PO TABS Oral Take 1 tablet by mouth daily.    Marland Kitchen METFORMIN HCL 1000 MG PO TABS Oral Take 2,000 mg by mouth at bedtime.    . OMEPRAZOLE 20 MG PO CPDR Oral Take 20 mg by mouth 2 (two) times daily.    . SERTRALINE HCL 100 MG PO TABS Oral Take 150 mg  by mouth every evening.     . TOPIRAMATE 100 MG PO TABS Oral Take 100 mg by mouth every evening.    . TRAZODONE HCL 100 MG PO TABS Oral Take 100 mg by mouth at bedtime. Take .25 tablet daily for anxiety and 0.5-1 tablet at bedtime      BP 102/70  Pulse 113  Temp(Src) 98.6 F (37 C) (Oral)  Resp 16  Ht 5\' 9"  (1.753 m)  Wt 210 lb (95.255 kg)  BMI 31.01 kg/m2  SpO2 98%  Physical Exam  Nursing note and vitals reviewed. Constitutional: She is oriented to person, place, and time. She appears well-developed and well-nourished.       Uncomfortable appearing  HENT:  Nose: Nose normal.  Mouth/Throat: No  oropharyngeal exudate.       Oral mucosa and lips are dry  Eyes: Conjunctivae are normal.  Neck: Normal range of motion. Neck supple.  Cardiovascular: Normal rate, regular rhythm and normal heart sounds.   Pulmonary/Chest: Effort normal and breath sounds normal. No respiratory distress. She has no wheezes. She has no rales.  Abdominal: Soft. Bowel sounds are normal. She exhibits no distension.       Diffuse tenderness, no guarding  Musculoskeletal: Normal range of motion. She exhibits no edema.  Lymphadenopathy:    She has no cervical adenopathy.  Neurological: She is alert and oriented to person, place, and time.  Skin: Skin is warm and dry.  Psychiatric: She has a normal mood and affect.    ED Course  Procedures (including critical care time)  Pt with persistent cough for a month. Now nausea, vomiting, diarrhea. Pt appears dry. Abdomen soft, diffuse tenderness. Will repeat CXR, IV fluids, zofran, labs pending.   Results for orders placed during the hospital encounter of 11/15/11  CBC      Component Value Range   WBC 13.8 (*) 4.0 - 10.5 (K/uL)   RBC 4.23  3.87 - 5.11 (MIL/uL)   Hemoglobin 11.7 (*) 12.0 - 15.0 (g/dL)   HCT 16.1 (*) 09.6 - 46.0 (%)   MCV 84.9  78.0 - 100.0 (fL)   MCH 27.7  26.0 - 34.0 (pg)   MCHC 32.6  30.0 - 36.0 (g/dL)   RDW 04.5  40.9 - 81.1 (%)   Platelets 402 (*) 150 - 400 (K/uL)  DIFFERENTIAL      Component Value Range   Neutrophils Relative 77  43 - 77 (%)   Neutro Abs 10.7 (*) 1.7 - 7.7 (K/uL)   Lymphocytes Relative 15  12 - 46 (%)   Lymphs Abs 2.1  0.7 - 4.0 (K/uL)   Monocytes Relative 7  3 - 12 (%)   Monocytes Absolute 1.0  0.1 - 1.0 (K/uL)   Eosinophils Relative 0  0 - 5 (%)   Eosinophils Absolute 0.1  0.0 - 0.7 (K/uL)   Basophils Relative 0  0 - 1 (%)   Basophils Absolute 0.0  0.0 - 0.1 (K/uL)  COMPREHENSIVE METABOLIC PANEL      Component Value Range   Sodium 138  135 - 145 (mEq/L)   Potassium 3.1 (*) 3.5 - 5.1 (mEq/L)   Chloride 99  96 -  112 (mEq/L)   CO2 17 (*) 19 - 32 (mEq/L)   Glucose, Bld 111 (*) 70 - 99 (mg/dL)   BUN 29 (*) 6 - 23 (mg/dL)   Creatinine, Ser 9.14 (*) 0.50 - 1.10 (mg/dL)   Calcium 78.2  8.4 - 10.5 (mg/dL)   Total Protein 9.0 (*)  6.0 - 8.3 (g/dL)   Albumin 4.0  3.5 - 5.2 (g/dL)   AST 25  0 - 37 (U/L)   ALT 51 (*) 0 - 35 (U/L)   Alkaline Phosphatase 98  39 - 117 (U/L)   Total Bilirubin 0.3  0.3 - 1.2 (mg/dL)   GFR calc non Af Amer 36 (*) >90 (mL/min)   GFR calc Af Amer 42 (*) >90 (mL/min)  LIPASE, BLOOD      Component Value Range   Lipase 76 (*) 11 - 59 (U/L)  URINALYSIS, ROUTINE W REFLEX MICROSCOPIC      Component Value Range   Color, Urine YELLOW  YELLOW    APPearance CLOUDY (*) CLEAR    Specific Gravity, Urine 1.020  1.005 - 1.030    pH 5.5  5.0 - 8.0    Glucose, UA NEGATIVE  NEGATIVE (mg/dL)   Hgb urine dipstick LARGE (*) NEGATIVE    Bilirubin Urine LARGE (*) NEGATIVE    Ketones, ur 15 (*) NEGATIVE (mg/dL)   Protein, ur 914 (*) NEGATIVE (mg/dL)   Urobilinogen, UA 0.2  0.0 - 1.0 (mg/dL)   Nitrite NEGATIVE  NEGATIVE    Leukocytes, UA SMALL (*) NEGATIVE   PREGNANCY, URINE      Component Value Range   Preg Test, Ur NEGATIVE  NEGATIVE   URINE MICROSCOPIC-ADD ON      Component Value Range   Squamous Epithelial / LPF MANY (*) RARE    WBC, UA 7-10  <3 (WBC/hpf)   RBC / HPF TOO NUMEROUS TO COUNT  <3 (RBC/hpf)   Bacteria, UA FEW (*) RARE    Casts GRANULAR CAST (*) NEGATIVE    Urine-Other MUCOUS PRESENT     Dg Chest 2 View  11/15/2011  *RADIOLOGY REPORT*  Clinical Data: Shortness of breath, cough.  CHEST - 2 VIEW  Comparison: 10/09/2011  Findings: Heart and mediastinal contours are within normal limits. No focal opacities or effusions.  No acute bony abnormality.  IMPRESSION: No active cardiopulmonary disease.  Original Report Authenticated By: Cyndie Chime, M.D.    Pt feeling better with 1L of NS, zofran. Pt appears dehydrated, dizzy when stands up, tachycardic, creatinine elevated from  baseline. Will put on dehydration protocol.   No diagnosis found.    MDM          Lottie Mussel, PA 11/15/11 1520

## 2011-11-15 NOTE — ED Provider Notes (Signed)
11:05 AM  I performed a history and physical examination of Carla Brooks and discussed her management with Kirichenko, PA.  I agree with the history, physical, assessment, and plan of care, with the following exceptions: None  The patient had presented for evaluation of nausea, vomiting, diarrhea, and generalized abdominal pain that she states began on for 2 weeks. On evaluation, she is awake, alert, oriented appropriately, in no apparent distress, with slightly dry mucous membranes suggestive of dehydration, no scleral icterus, normal bowel sounds, abdomen soft, diffusely tender, mild tenderness, without are doing, rebound, or mass. Her lungs are clear bilaterally with no wheezes, rales or rhonchi. The patient's creatinine is slightly elevated suggestive of dehydration. The patient is feeling much better after Zofran administration, but she will need further rehydration which we will provide in the CDU under the dehydration protocol.   I was present for the following procedures: None Time Spent in Critical Care of the patient: None Time spent in discussions with the patient and family: 5 minutes  Adarian Bur D    Felisa Bonier, MD 11/15/11 [phone]

## 2011-11-15 NOTE — ED Notes (Signed)
Pt states she was seen early February here in ED and dx with pneumonia. Pt states no improvement, went to Urgent Care last Friday where she was told she still has pneumonia. Pt states she has had n/v/d since last Friday maybe longer.

## 2011-11-15 NOTE — Discharge Instructions (Signed)
Take vicodin as prescribed for severe pain.   Do not drive within four hours of taking this medication (may cause drowsiness or confusion).  Take zofran as needed for nausea and potassium as prescribed.   Get rest and drink plenty of fluids.  Refer to [employer] below for foods that are less likely to cause nausea.  Follow up with your primary care doctor.  You should return to the ER if you develop fever, worsening pain or uncontrolled vomiting.   B.R.A.T. Diet Your doctor has recommended the B.R.A.T. diet for you or your child until the condition improves. This is often used to help control diarrhea and vomiting symptoms. If you or your child can tolerate clear liquids, you may have:  Bananas.   Rice.   Applesauce.   Toast (and other simple starches such as crackers, potatoes, noodles).  Be sure to avoid dairy products, meats, and fatty foods until symptoms are better. Fruit juices such as apple, grape, and prune juice can make diarrhea worse. Avoid these. Continue this diet for 2 days or as instructed by your caregiver. Document Released: 08/19/2005 Document Revised: 08/08/2011 Document Reviewed: 02/05/2007 [facility] Patient Information 2012 [place], [place].Dehydration, Adult Dehydration is when you lose more fluids from the body than you take in. Vital organs like the kidneys, brain, and heart cannot function without a proper amount of fluids and salt. Any loss of fluids from the body can cause dehydration.  CAUSES   Vomiting.   Diarrhea.   Excessive sweating.   Excessive urine output.   Fever.  SYMPTOMS  Mild dehydration  Thirst.   Dry lips.   Slightly dry mouth.  Moderate dehydration  Very dry mouth.   Sunken eyes.   Skin does not bounce back quickly when lightly pinched and released.   Dark urine and decreased urine production.   Decreased tear production.   Headache.  Severe dehydration  Very dry mouth.   Extreme thirst.   Rapid, weak pulse (more than 100  beats per minute at rest).   Cold hands and feet.   Not able to sweat in spite of heat and temperature.   Rapid breathing.   Blue lips.   Confusion and lethargy.   Difficulty being awakened.   Minimal urine production.   No tears.  DIAGNOSIS  Your caregiver will diagnose dehydration based on your symptoms and your exam. Blood and urine tests will help confirm the diagnosis. The diagnostic evaluation should also identify the cause of dehydration. TREATMENT  Treatment of mild or moderate dehydration can often be done at home by increasing the amount of fluids that you drink. It is best to drink small amounts of fluid more often. Drinking too much at one time can make vomiting worse. Refer to the home care instructions below. Severe dehydration needs to be treated at the hospital where you will probably be given intravenous (IV) fluids that contain water and electrolytes. HOME CARE INSTRUCTIONS   Ask your caregiver about specific rehydration instructions.   Drink enough fluids to keep your urine clear or pale yellow.   Drink small amounts frequently if you have nausea and vomiting.   Eat as you normally do.   Avoid:   Foods or drinks high in sugar.   Carbonated drinks.   Juice.   Extremely hot or cold fluids.   Drinks with caffeine.   Fatty, greasy foods.   Alcohol.   Tobacco.   Overeating.   Gelatin desserts.   Wash your hands well to avoid  spreading bacteria and viruses.   Only take over-the-counter or prescription medicines for pain, discomfort, or fever as directed by your caregiver.   Ask your caregiver if you should continue all prescribed and over-the-counter medicines.   Keep all follow-up appointments with your caregiver.  SEEK MEDICAL CARE IF:  You have abdominal pain and it increases or stays in one area (localizes).   You have a rash, stiff neck, or severe headache.   You are irritable, sleepy, or difficult to awaken.   You are weak,  dizzy, or extremely thirsty.  SEEK IMMEDIATE MEDICAL CARE IF:   You are unable to keep fluids down or you get worse despite treatment.   You have frequent episodes of vomiting or diarrhea.   You have blood or green matter (bile) in your vomit.   You have blood in your stool or your stool looks black and tarry.   You have not urinated in 6 to 8 hours, or you have only urinated a small amount of very dark urine.   You have a fever.   You faint.  MAKE SURE YOU:   Understand these instructions.   Will watch your condition.   Will get help right away if you are not doing well or get worse.  Document Released: 08/19/2005 Document Revised: 08/08/2011 Document Reviewed: 04/08/2011 [facility] Patient Information 2012 [place], [place].

## 2011-11-16 LAB — URINE CULTURE: Culture  Setup Time: 201303151335

## 2011-11-26 ENCOUNTER — Emergency Department (HOSPITAL_COMMUNITY)
Admission: EM | Admit: 2011-11-26 | Discharge: 2011-11-26 | Disposition: A | Discharge status: Home Health | Payer: Medicaid Other | Attending: Emergency Medicine | Admitting: Emergency Medicine

## 2011-11-26 ENCOUNTER — Encounter (HOSPITAL_COMMUNITY): Payer: Self-pay | Admitting: *Deleted

## 2011-11-26 DIAGNOSIS — M129 Arthropathy, unspecified: Secondary | ICD-10-CM | POA: Insufficient documentation

## 2011-11-26 DIAGNOSIS — Z794 Long term (current) use of insulin: Secondary | ICD-10-CM | POA: Insufficient documentation

## 2011-11-26 DIAGNOSIS — Z79899 Other long term (current) drug therapy: Secondary | ICD-10-CM | POA: Insufficient documentation

## 2011-11-26 DIAGNOSIS — K219 Gastro-esophageal reflux disease without esophagitis: Secondary | ICD-10-CM | POA: Insufficient documentation

## 2011-11-26 DIAGNOSIS — H53149 Visual discomfort, unspecified: Secondary | ICD-10-CM | POA: Insufficient documentation

## 2011-11-26 DIAGNOSIS — R51 Headache: Secondary | ICD-10-CM | POA: Insufficient documentation

## 2011-11-26 DIAGNOSIS — R110 Nausea: Secondary | ICD-10-CM | POA: Insufficient documentation

## 2011-11-26 DIAGNOSIS — E119 Type 2 diabetes mellitus without complications: Secondary | ICD-10-CM | POA: Insufficient documentation

## 2011-11-26 DIAGNOSIS — I10 Essential (primary) hypertension: Secondary | ICD-10-CM | POA: Insufficient documentation

## 2011-11-26 LAB — GLUCOSE, CAPILLARY: Glucose-Capillary: 98 mg/dL (ref 70–99)

## 2011-11-26 MED ORDER — SODIUM CHLORIDE 0.9 % IV BOLUS (SEPSIS)
1000.0000 mL | Freq: Once | INTRAVENOUS | Status: AC
  Administered 2011-11-26: 1000 mL via INTRAVENOUS

## 2011-11-26 MED ORDER — DEXAMETHASONE SODIUM PHOSPHATE 10 MG/ML IJ SOLN
10.0000 mg | Freq: Once | INTRAMUSCULAR | Status: AC
  Administered 2011-11-26: 10 mg via INTRAVENOUS
  Filled 2011-11-26: qty 1

## 2011-11-26 MED ORDER — METOCLOPRAMIDE HCL 5 MG/ML IJ SOLN
10.0000 mg | Freq: Once | INTRAMUSCULAR | Status: AC
  Administered 2011-11-26: 10 mg via INTRAVENOUS
  Filled 2011-11-26: qty 2

## 2011-11-26 MED ORDER — KETOROLAC TROMETHAMINE 30 MG/ML IJ SOLN
30.0000 mg | Freq: Once | INTRAMUSCULAR | Status: AC
  Administered 2011-11-26: 30 mg via INTRAVENOUS
  Filled 2011-11-26: qty 1

## 2011-11-26 MED ORDER — DIPHENHYDRAMINE HCL 50 MG/ML IJ SOLN
25.0000 mg | Freq: Once | INTRAMUSCULAR | Status: AC
  Administered 2011-11-26: 25 mg via INTRAVENOUS
  Filled 2011-11-26: qty 1

## 2011-11-26 NOTE — ED Notes (Signed)
Patient states headache x 2 days unable to diminish with normal regime of pain medication.  Patient denies dizziness, changes in vision.  Patient states light makes headache worse, patient described pain as throbbing, constant pain.

## 2011-11-26 NOTE — Discharge Instructions (Signed)
Headache, General, Unknown Cause The specific cause of your headache may not have been found today. There are many causes and types of headache. A few common ones are:  Tension headache.   Migraine.   Infections (examples: dental and sinus infections).   Bone and/or joint problems in the neck or jaw.   Depression.   Eye problems.  These headaches are not life threatening.  Headaches can sometimes be diagnosed by a patient history and a physical exam. Sometimes, lab and imaging studies (such as x-ray and/or CT scan) are used to rule out more serious problems. In some cases, a spinal tap (lumbar puncture) may be requested. There are many times when your exam and tests may be normal on the first visit even when there is a serious problem causing your headaches. Because of that, it is very important to follow up with your doctor or local clinic for further evaluation. FINDING OUT THE RESULTS OF TESTS  If a radiology test was performed, a radiologist will review your results.   You will be contacted by the emergency department or your physician if any test results require a change in your treatment plan.   Not all test results may be available during your visit. If your test results are not back during the visit, make an appointment with your caregiver to find out the results. Do not assume everything is normal if you have not heard from your caregiver or the medical facility. It is important for you to follow up on all of your test results.  HOME CARE INSTRUCTIONS   Keep follow-up appointments with your caregiver, or any specialist referral.   Only take over-the-counter or prescription medicines for pain, discomfort, or fever as directed by your caregiver.   Biofeedback, massage, or other relaxation techniques may be helpful.   Ice packs or heat applied to the head and neck can be used. Do this three to four times per day, or as needed.   Call your doctor if you have any questions or  concerns.   If you smoke, you should quit.  SEEK MEDICAL CARE IF:   You develop problems with medications prescribed.   You do not respond to or obtain relief from medications.   You have a change from the usual headache.   You develop nausea or vomiting.  SEEK IMMEDIATE MEDICAL CARE IF:   If your headache becomes severe.   You have an unexplained oral temperature above 102 F (38.9 C), or as your caregiver suggests.   You have a stiff neck.   You have loss of vision.   You have muscular weakness.   You have loss of muscular control.   You develop severe symptoms different from your first symptoms.   You start losing your balance or have trouble walking.   You feel faint or pass out.  MAKE SURE YOU:   Understand these instructions.   Will watch your condition.   Will get help right away if you are not doing well or get worse.  Document Released: 08/19/2005 Document Revised: 08/08/2011 Document Reviewed: 04/07/2008 [facility] Patient Information 2012 [place], [place].   RESOURCE GUIDE  Dental Problems  Patients with Medicaid: [facility] [id number]  [place] Phone:  [phone]                                                  Phone:  [phone]  If unable to pay or uninsured, contact:  [facility] or [facility]. to become qualified for the adult dental clinic.  Chronic Pain Problems Contact [facility]  [phone] Patients need to be referred by their primary care doctor.  Insufficient Money for Medicine Contact [employer]:  call "211" or [place] [phone].  No Primary Care Doctor Call [facility]  [phone] Other agencies that provide inexpensive medical care    [place]  [phone]    [facility]  [place]  [phone]    [facility]   [phone]    [facility]  [place]  [place]  [phone] [place]   [phone] (emergency services [phone])  [facility]  [phone] [facility] [phone] [facility] [place] [phone] [place]a Pester [phone] Residential & Outpatient Substance Abuse Program  [phone]  Abuse/Neglect [facility] [phone] [place] [phone] (After Hours)  Emergency [place] [phone]  [place] [facility] [place] [phone] [place] [phone]  MRSA Hotline #:   620-791-9207    [facility] Dept. [address]      [place] Phone:  [phone]                                   Phone:  [phone]                 Phone:  [place] Phone:  [place] [phone] (After Hours)

## 2011-11-26 NOTE — ED Notes (Signed)
Patient reports she has had a migraine for 2 days and she had elevated blood sugar today with reading of 217

## 2011-11-26 NOTE — ED Provider Notes (Signed)
History     CSN: [id number]  Arrival date & time 11/26/11  1803   First MD Initiated Contact with Patient 11/26/11 2003      Chief Complaint  Patient presents with  . Migraine  . Hyperglycemia     HPI  History provided by the patient. Patient is a 41 year old female with history of diabetes, hypertension migraine headaches who presents with complaints of similar migraine headache for the past 2 days. Pain is located in some of the head and does not radiate. Symptoms came on gradually and have been persistent. Pain is described as a throbbing severe pain and pressure. Patient has used up to 8 Excedrin without relief. Patient reports she usually has good relief with Excedrin for her migraine. Patient has associated photophobia and slight nausea. She denies any fever, chills, sweats, vomiting. Patient denies any focal neurologic symptoms. No weakness or numbness in extremities. No confusion, no facial droop. Patient denies any other aggravating or alleviating factors.   Past Medical History  Diagnosis Date  . Diabetes mellitus   . Hypertension   . Bronchitis   . Arthritis   . GERD (gastroesophageal reflux disease)     Past Surgical History  Procedure Date  . Cholecystectomy     No family history on file.  History  Substance Use Topics  . Smoking status: Never Smoker   . Smokeless tobacco: Not on file  . Alcohol Use: No    OB History    Grav Para Term Preterm Abortions TAB SAB Ect Mult Living                  Review of Systems  Constitutional: Negative for fever, chills, diaphoresis and appetite change.  HENT: Negative for congestion and rhinorrhea.   Eyes: Positive for photophobia. Negative for pain.  Cardiovascular: Negative for chest pain.  Gastrointestinal: Positive for nausea. Negative for vomiting.  Musculoskeletal: Negative for myalgias.  Neurological: Positive for headaches. Negative for dizziness, weakness, light-headedness and numbness.    Allergies    Penicillins  Home Medications   Current Outpatient Rx  Name Route Sig Dispense Refill  . ASPIRIN-ACETAMINOPHEN-CAFFEINE 250-250-65 MG PO TABS Oral Take 1 tablet by mouth every 6 (six) hours as needed. For migraines    . DIVALPROEX SODIUM ER 500 MG PO TB24 Oral Take 1,000 mg by mouth at bedtime.    . INSULIN ASPART 100 UNIT/ML [place] SOLN Subcutaneous Inject 4-15 Units into the skin 3 (three) times daily before meals. Sliding scale insulin    . LISINOPRIL-HYDROCHLOROTHIAZIDE 10-12.5 MG PO TABS Oral Take 1 tablet by mouth daily.    Marland Kitchen METFORMIN HCL 1000 MG PO TABS Oral Take 2,000 mg by mouth at bedtime.    . OMEPRAZOLE 20 MG PO CPDR Oral Take 20 mg by mouth 2 (two) times daily.    . SERTRALINE HCL 100 MG PO TABS Oral Take 150 mg by mouth every evening.     . TOPIRAMATE 100 MG PO TABS Oral Take 100 mg by mouth every evening.    Marland Kitchen HYDROCODONE-ACETAMINOPHEN 5-325 MG PO TABS Oral Take 1 tablet by mouth every 4 (four) hours as needed for pain. 20 tablet 0  . TRAZODONE HCL 100 MG PO TABS Oral Take 100 mg by mouth See admin instructions. Take 0.25 tablet daily for anxiety and 0.5-1 tablet at bedtime as needed for anxiety/insomnia      BP 131/82  Pulse 79  Temp(Src) 97.7 F (36.5 C) (Oral)  Resp 16  Ht 5\' 9"  (  1.753 m)  Wt 195 lb (88.451 kg)  BMI 28.80 kg/m2  SpO2 100%  LMP 11/15/2011  Physical Exam  Nursing note and vitals reviewed. Constitutional: She is oriented to person, place, and time. She appears well-developed and well-nourished. No distress.  HENT:  Head: Normocephalic and atraumatic.  Eyes: Conjunctivae are normal. Pupils are equal, round, and reactive to light.  Neck: Normal range of motion. Neck supple.       No meningeal signs.  Cardiovascular: Normal rate and regular rhythm.   Pulmonary/Chest: Effort normal and breath sounds normal. No respiratory distress. She has no wheezes. She has no rales.  Musculoskeletal: Normal range of motion.  Neurological: She is alert and oriented  to person, place, and time. She has normal strength. No cranial nerve deficit or sensory deficit. Gait normal.  Skin: Skin is warm and dry. No rash noted.  Psychiatric: She has a normal mood and affect. Her behavior is normal.    ED Course  Procedures   Results for orders placed during the hospital encounter of 11/26/11  GLUCOSE, CAPILLARY      Component Value Range   Glucose-Capillary 98  70 - 99 (mg/dL)   Comment 1 Documented in Chart        1. Headache       MDM  8:20PM patient seen and evaluated. Patient no acute distress.        Angus Seller, [place] 11/28/11 [phone]

## 2011-11-28 NOTE — ED Provider Notes (Signed)
Evaluation and management procedures were performed by the PA/NP under my supervision/collaboration.   Jahmeir Geisen D Kynzley Dowson, MD 11/28/11 1024 

## 2011-11-28 NOTE — ED Provider Notes (Signed)
Evaluation and management procedures were performed by the PA/NP under my supervision/collaboration.   Elinora Weigand D Chrishawna Farina, MD 11/28/11 1022 

## 2011-11-28 NOTE — ED Provider Notes (Signed)
Evaluation and management procedures were performed by the PA/NP under my supervision/collaboration.   Kadeisha Betsch D Colleen Donahoe, MD 11/28/11 1023 

## 2011-11-29 NOTE — ED Provider Notes (Signed)
Medical screening examination/treatment/procedure(s) were performed by non-physician practitioner and as supervising physician I was immediately available for consultation/collaboration.   Loren Racer, MD 11/29/11 [phone]

## 2012-02-21 ENCOUNTER — Encounter (HOSPITAL_COMMUNITY): Payer: Self-pay | Admitting: *Deleted

## 2012-02-21 ENCOUNTER — Emergency Department (HOSPITAL_COMMUNITY): Payer: Medicaid Other

## 2012-02-21 ENCOUNTER — Inpatient Hospital Stay (HOSPITAL_COMMUNITY)
Admission: EM | Admit: 2012-02-21 | Discharge: 2012-02-24 | DRG: 313 | Disposition: A | Discharge status: Home / Self Care | Payer: Medicaid Other | Attending: Internal Medicine | Admitting: Internal Medicine

## 2012-02-21 DIAGNOSIS — R197 Diarrhea, unspecified: Secondary | ICD-10-CM | POA: Diagnosis present

## 2012-02-21 DIAGNOSIS — Z9089 Acquired absence of other organs: Secondary | ICD-10-CM

## 2012-02-21 DIAGNOSIS — R000 Tachycardia, unspecified: Secondary | ICD-10-CM

## 2012-02-21 DIAGNOSIS — I951 Orthostatic hypotension: Secondary | ICD-10-CM | POA: Diagnosis present

## 2012-02-21 DIAGNOSIS — Z7982 Long term (current) use of aspirin: Secondary | ICD-10-CM

## 2012-02-21 DIAGNOSIS — E876 Hypokalemia: Secondary | ICD-10-CM | POA: Diagnosis present

## 2012-02-21 DIAGNOSIS — Z8249 Family history of ischemic heart disease and other diseases of the circulatory system: Secondary | ICD-10-CM

## 2012-02-21 DIAGNOSIS — Z880 Allergy status to penicillin: Secondary | ICD-10-CM

## 2012-02-21 DIAGNOSIS — G43909 Migraine, unspecified, not intractable, without status migrainosus: Secondary | ICD-10-CM

## 2012-02-21 DIAGNOSIS — I10 Essential (primary) hypertension: Secondary | ICD-10-CM | POA: Diagnosis present

## 2012-02-21 DIAGNOSIS — K219 Gastro-esophageal reflux disease without esophagitis: Secondary | ICD-10-CM | POA: Diagnosis present

## 2012-02-21 DIAGNOSIS — Z794 Long term (current) use of insulin: Secondary | ICD-10-CM

## 2012-02-21 DIAGNOSIS — D72829 Elevated white blood cell count, unspecified: Secondary | ICD-10-CM | POA: Diagnosis present

## 2012-02-21 DIAGNOSIS — E869 Volume depletion, unspecified: Secondary | ICD-10-CM | POA: Diagnosis present

## 2012-02-21 DIAGNOSIS — J40 Bronchitis, not specified as acute or chronic: Secondary | ICD-10-CM | POA: Insufficient documentation

## 2012-02-21 DIAGNOSIS — R079 Chest pain, unspecified: Secondary | ICD-10-CM | POA: Diagnosis present

## 2012-02-21 DIAGNOSIS — E119 Type 2 diabetes mellitus without complications: Secondary | ICD-10-CM | POA: Diagnosis present

## 2012-02-21 DIAGNOSIS — D649 Anemia, unspecified: Secondary | ICD-10-CM

## 2012-02-21 DIAGNOSIS — M1990 Unspecified osteoarthritis, unspecified site: Secondary | ICD-10-CM | POA: Insufficient documentation

## 2012-02-21 DIAGNOSIS — R0789 Other chest pain: Principal | ICD-10-CM | POA: Diagnosis present

## 2012-02-21 DIAGNOSIS — I498 Other specified cardiac arrhythmias: Secondary | ICD-10-CM | POA: Diagnosis present

## 2012-02-21 DIAGNOSIS — D509 Iron deficiency anemia, unspecified: Secondary | ICD-10-CM | POA: Diagnosis present

## 2012-02-21 HISTORY — DX: Anemia, unspecified: D64.9

## 2012-02-21 HISTORY — DX: Essential (primary) hypertension: I10

## 2012-02-21 HISTORY — DX: Migraine, unspecified, not intractable, without status migrainosus: G43.909

## 2012-02-21 HISTORY — DX: Type 2 diabetes mellitus without complications: E11.9

## 2012-02-21 HISTORY — DX: Unspecified asthma, uncomplicated: J45.909

## 2012-02-21 LAB — URINALYSIS, ROUTINE W REFLEX MICROSCOPIC
Hgb urine dipstick: NEGATIVE
Specific Gravity, Urine: 1.027 (ref 1.005–1.030)
Urobilinogen, UA: 0.2 mg/dL (ref 0.0–1.0)

## 2012-02-21 LAB — POCT I-STAT TROPONIN I
Troponin i, poc: 0 ng/mL (ref 0.00–0.08)
Troponin i, poc: 0 ng/mL (ref 0.00–0.08)

## 2012-02-21 LAB — BASIC METABOLIC PANEL
BUN: 13 mg/dL (ref 6–23)
CO2: 25 mEq/L (ref 19–32)
Calcium: 9.3 mg/dL (ref 8.4–10.5)
Glucose, Bld: 84 mg/dL (ref 70–99)
Sodium: 137 mEq/L (ref 135–145)

## 2012-02-21 LAB — CBC
HCT: 29.6 % — ABNORMAL LOW (ref 36.0–46.0)
Hemoglobin: 9.8 g/dL — ABNORMAL LOW (ref 12.0–15.0)
Hemoglobin: 9.1 g/dL — ABNORMAL LOW (ref 12.0–15.0)
MCH: 28.2 pg (ref 26.0–34.0)
MCH: 27.3 pg (ref 26.0–34.0)
MCHC: 33.1 g/dL (ref 30.0–36.0)
MCV: 85.1 fL (ref 78.0–100.0)
Platelets: 319 10*3/uL (ref 150–400)
RBC: 3.48 MIL/uL — ABNORMAL LOW (ref 3.87–5.11)
RBC: 3.33 MIL/uL — ABNORMAL LOW (ref 3.87–5.11)
WBC: 9.5 10*3/uL (ref 4.0–10.5)

## 2012-02-21 LAB — PROTIME-INR: INR: 1.12 (ref 0.00–1.49)

## 2012-02-21 LAB — URINE MICROSCOPIC-ADD ON

## 2012-02-21 LAB — CK TOTAL AND CKMB (NOT AT ARMC): CK, MB: 0.8 ng/mL (ref 0.3–4.0)

## 2012-02-21 LAB — CREATININE, SERUM
Creatinine, Ser: 0.9 mg/dL (ref 0.50–1.10)
GFR calc non Af Amer: 80 mL/min — ABNORMAL LOW (ref >90)

## 2012-02-21 LAB — POCT PREGNANCY, URINE: Preg Test, Ur: NEGATIVE

## 2012-02-21 LAB — MAGNESIUM: Magnesium: 1.7 mg/dL (ref 1.5–2.5)

## 2012-02-21 LAB — CARDIAC PANEL(CRET KIN+CKTOT+MB+TROPI)
Relative Index: 0.5 (ref 0.0–2.5)
Troponin I: <0.3 ng/mL (ref <0.30)

## 2012-02-21 MED ORDER — POTASSIUM CHLORIDE CRYS ER 20 MEQ PO TBCR
40.0000 meq | EXTENDED_RELEASE_TABLET | ORAL | Status: AC
  Administered 2012-02-21 (×2): 40 meq via ORAL
  Filled 2012-02-21 (×2): qty 2

## 2012-02-21 MED ORDER — PANTOPRAZOLE SODIUM 40 MG PO TBEC: 40.0000 mg | DELAYED_RELEASE_TABLET | Freq: Every day | ORAL | Status: DC

## 2012-02-21 MED ORDER — SODIUM CHLORIDE 0.9 % IV SOLN
INTRAVENOUS | Status: AC
  Administered 2012-02-21: 20:00:00 via INTRAVENOUS
  Administered 2012-02-22: 1000 mL via INTRAVENOUS
  Administered 2012-02-22: 04:00:00 via INTRAVENOUS

## 2012-02-21 MED ORDER — IOHEXOL 350 MG/ML SOLN
60.0000 mL | Freq: Once | INTRAVENOUS | Status: AC | PRN
  Administered 2012-02-21: 60 mL via INTRAVENOUS

## 2012-02-21 MED ORDER — SODIUM CHLORIDE 0.9 % IV BOLUS (SEPSIS)
1000.0000 mL | Freq: Once | INTRAVENOUS | Status: AC
  Administered 2012-02-21: 1000 mL via INTRAVENOUS

## 2012-02-21 MED ORDER — PANTOPRAZOLE SODIUM 40 MG PO TBEC
40.0000 mg | DELAYED_RELEASE_TABLET | Freq: Every day | ORAL | Status: DC
  Administered 2012-02-21 – 2012-02-24 (×3): 40 mg via ORAL
  Filled 2012-02-21 (×3): qty 1

## 2012-02-21 MED ORDER — SODIUM CHLORIDE 0.9 % IV SOLN: INTRAVENOUS | Status: AC

## 2012-02-21 MED ORDER — ACETAMINOPHEN 650 MG RE SUPP: 650.0000 mg | Freq: Four times a day (QID) | RECTAL | Status: DC | PRN

## 2012-02-21 MED ORDER — SODIUM CHLORIDE 0.9 % IJ SOLN
3.0000 mL | Freq: Two times a day (BID) | INTRAMUSCULAR | Status: DC
  Administered 2012-02-21 – 2012-02-24 (×5): 3 mL via INTRAVENOUS

## 2012-02-21 MED ORDER — KETOROLAC TROMETHAMINE 30 MG/ML IJ SOLN
30.0000 mg | Freq: Once | INTRAMUSCULAR | Status: AC
  Administered 2012-02-21: 30 mg via INTRAVENOUS
  Filled 2012-02-21: qty 1

## 2012-02-21 MED ORDER — TRAMADOL HCL 50 MG PO TABS
50.0000 mg | ORAL_TABLET | Freq: Four times a day (QID) | ORAL | Status: DC | PRN
  Administered 2012-02-22: 50 mg via ORAL
  Filled 2012-02-21: qty 1

## 2012-02-21 MED ORDER — ASPIRIN EC 81 MG PO TBEC: 81.0000 mg | DELAYED_RELEASE_TABLET | Freq: Every day | ORAL | Status: DC

## 2012-02-21 MED ORDER — ONDANSETRON HCL 4 MG/2ML IJ SOLN: 4.0000 mg | Freq: Three times a day (TID) | INTRAMUSCULAR | Status: DC | PRN

## 2012-02-21 MED ORDER — ASPIRIN 325 MG PO TABS
325.0000 mg | ORAL_TABLET | Freq: Every day | ORAL | Status: DC
  Administered 2012-02-21 – 2012-02-24 (×4): 325 mg via ORAL
  Filled 2012-02-21 (×4): qty 1

## 2012-02-21 MED ORDER — ONDANSETRON HCL 4 MG PO TABS: 4.0000 mg | ORAL_TABLET | Freq: Four times a day (QID) | ORAL | Status: DC | PRN

## 2012-02-21 MED ORDER — ACETAMINOPHEN 325 MG PO TABS
650.0000 mg | ORAL_TABLET | Freq: Four times a day (QID) | ORAL | Status: DC | PRN
  Administered 2012-02-22: 650 mg via ORAL
  Filled 2012-02-21: qty 2

## 2012-02-21 MED ORDER — GI COCKTAIL ~~LOC~~
30.0000 mL | Freq: Three times a day (TID) | ORAL | Status: DC | PRN
  Filled 2012-02-21: qty 30

## 2012-02-21 MED ORDER — LORAZEPAM 1 MG PO TABS
1.0000 mg | ORAL_TABLET | Freq: Once | ORAL | Status: AC
  Administered 2012-02-21: 1 mg via ORAL
  Filled 2012-02-21: qty 1

## 2012-02-21 MED ORDER — MORPHINE SULFATE 2 MG/ML IJ SOLN
2.0000 mg | INTRAMUSCULAR | Status: DC | PRN
  Administered 2012-02-22 – 2012-02-23 (×3): 2 mg via INTRAVENOUS
  Filled 2012-02-21 (×3): qty 1

## 2012-02-21 MED ORDER — NITROGLYCERIN 0.3 MG SL SUBL
0.3000 mg | SUBLINGUAL_TABLET | SUBLINGUAL | Status: DC | PRN
  Administered 2012-02-23: 0.3 mg via SUBLINGUAL
  Filled 2012-02-21: qty 100

## 2012-02-21 MED ORDER — ENOXAPARIN SODIUM 40 MG/0.4ML ~~LOC~~ SOLN
40.0000 mg | SUBCUTANEOUS | Status: DC
  Administered 2012-02-21 – 2012-02-23 (×3): 40 mg via SUBCUTANEOUS
  Filled 2012-02-21 (×4): qty 0.4

## 2012-02-21 MED ORDER — PROPRANOLOL HCL 10 MG PO TABS
10.0000 mg | ORAL_TABLET | Freq: Every day | ORAL | Status: DC
  Administered 2012-02-21: 10 mg via ORAL
  Filled 2012-02-21 (×2): qty 1

## 2012-02-21 MED ORDER — ALUM & MAG HYDROXIDE-SIMETH 200-200-20 MG/5ML PO SUSP
30.0000 mL | Freq: Four times a day (QID) | ORAL | Status: DC | PRN
  Administered 2012-02-23: 30 mL via ORAL
  Filled 2012-02-21: qty 30

## 2012-02-21 MED ORDER — INSULIN ASPART 100 UNIT/ML ~~LOC~~ SOLN
0.0000 [IU] | Freq: Three times a day (TID) | SUBCUTANEOUS | Status: DC
  Administered 2012-02-24: 2 [IU] via SUBCUTANEOUS

## 2012-02-21 MED ORDER — ONDANSETRON HCL 4 MG/2ML IJ SOLN
4.0000 mg | Freq: Four times a day (QID) | INTRAMUSCULAR | Status: DC | PRN
  Administered 2012-02-22: 4 mg via INTRAVENOUS
  Filled 2012-02-21: qty 2

## 2012-02-21 MED ORDER — ALBUTEROL SULFATE HFA 108 (90 BASE) MCG/ACT IN AERS
2.0000 | INHALATION_SPRAY | Freq: Four times a day (QID) | RESPIRATORY_TRACT | Status: DC | PRN
Start: 2012-02-21 — End: 2012-02-24
  Filled 2012-02-21: qty 6.7

## 2012-02-21 MED ORDER — ASPIRIN-ACETAMINOPHEN-CAFFEINE 250-250-65 MG PO TABS
1.0000 | ORAL_TABLET | Freq: Four times a day (QID) | ORAL | Status: DC | PRN
  Administered 2012-02-23 – 2012-02-24 (×2): 1 via ORAL
  Filled 2012-02-21 (×6): qty 1

## 2012-02-21 NOTE — ED Provider Notes (Signed)
Medical screening examination/treatment/procedure(s) were conducted as a shared visit with non-physician practitioner(s) and myself.  I personally evaluated the patient during the encounter  Persistent sinus tachycardia, chest pain with risk factors of HTN, DM.  Troponin neg, ddimer is +, chest CT is neg for obvious PE per radioligst interpretation minus mild motion artifact.  sats are normal on RA, so unlikely to be PE.  Will admit for r/o and refer to cardiologist if rule out shows no MI and HR normalizes.  No respiratory distress.    Gavin Pound. Bushra Denman, MD 02/21/12 2106

## 2012-02-21 NOTE — ED Notes (Signed)
Patient transported to CT 

## 2012-02-21 NOTE — ED Notes (Signed)
Dr. Janee Morn at bedside seeing patient. Report has been called. Will take pt upstairs when MD finished.

## 2012-02-21 NOTE — H&P (Signed)
Carla Brooks MRN: [id number] DOB/AGE: Sep 07, 1970 41 y.o. Primary Care Physician:AVBUERE,EDWIN A, MD Admit date: 02/21/2012 Chief Complaint: Chest pain HPI:  Carla Brooks is a 41 year old African American female with a history of hypertension, type 2 diabetes since 2010, family history of coronary artery disease/cardiac etiology as patient's father had a pacemaker placement defibrillator placed at age 47 secondary to syncope. Patient also with a history of gastroesophageal reflux disease, migraine headaches who presented to the ED with right-sided chest pain. Patient's symptoms occurred when he awoke on the morning of admission around 4 AM. Patient describes his chest pain as if someone was sitting on his chest and lasted approximately 2 hours after which it had been lingering. Patient denies chest pain radiates. Patient states that laying down flat or sideways improvement in his chest pain and standing up worsened assessment. Patient endorses shortness of breath, dizziness, diaphoresis, nausea, and diarrhea. Patient denies any fevers, no chills, no abdominal pain, no constipation no recent antibiotic use. Patient subsequently called EMS and was brought to the ED. Patient was given some aspirin as well as nitroglycerin on route to resolution of her shortness of breath and significant improvement in her chest pain. Patient states has been chest pain-free since in the ED. Labs obtained did show an EKG with sinus tachycardia, urinalysis which was done was negative, CT angiogram done of the chest was negative for PE. BMET done in the ED showed a potassium of 3.2 otherwise was within normal limits. First set of cardiac enzymes were negative. CBC with a white count of 15.5 hemoglobin of 9.8 and a platelet count of 366. Will call to admit the patient for further evaluation and management.  Past Medical History  Diagnosis Date  . Diabetes mellitus   . Hypertension   . Bronchitis   . Arthritis   . GERD  (gastroesophageal reflux disease)   . Asthma   . Migraines 02/21/2012  . Anemia 02/21/2012  . HTN (hypertension) 02/21/2012  . Diabetes mellitus 02/21/2012    Past Surgical History  Procedure Date  . Cholecystectomy 09/27/2001    Prior to Admission medications   Medication Sig Start Date End Date Taking? Authorizing Provider  albuterol (PROVENTIL HFA;VENTOLIN HFA) 108 (90 BASE) MCG/ACT inhaler Inhale 2 puffs into the lungs every 6 (six) hours as needed. For wheezing   Yes Historical Provider, MD  aspirin-acetaminophen-caffeine (EXCEDRIN MIGRAINE) [phone] MG per tablet Take 1 tablet by mouth every 6 (six) hours as needed. For migraines   Yes Historical Provider, MD  insulin aspart (NOVOLOG) 100 UNIT/ML injection Inject 4-15 Units into the skin 3 (three) times daily before meals. Sliding scale insulin   Yes Historical Provider, MD  lisinopril-hydrochlorothiazide (PRINZIDE,ZESTORETIC) 10-12.5 MG per tablet Take 1 tablet by mouth daily.   Yes Historical Provider, MD  metFORMIN (GLUCOPHAGE) 1000 MG tablet Take 2,000 mg by mouth at bedtime.   Yes Historical Provider, MD  omeprazole (PRILOSEC) 20 MG capsule Take 20 mg by mouth 2 (two) times daily.   Yes Historical Provider, MD  propranolol (INDERAL) 10 MG tablet Take 10 mg by mouth at bedtime.   Yes Historical Provider, MD    Allergies:  Allergies  Allergen Reactions  . Penicillins Hives    Family History  Problem Relation Age of Onset  . Coronary artery disease Father     Social History:  reports that she has never smoked. She does not have any smokeless tobacco history on file. She reports that she does not drink alcohol or use illicit drugs.  ROS: All systems reviewed with the patient and was positive as per HPI otherwise all other systems are negative.  PHYSICAL EXAM: Blood pressure 117/72, pulse 104, temperature 98.5 F (36.9 C), temperature source Oral, resp. rate 23, last menstrual period 01/29/2012, SpO2 100.00%. General:  Well-developed well-nourished in no acute cardiopulmonary distress.  HEENT: Normocephalic atraumatic. Pupils equal round react to light and accommodation. Oropharynx is clear, no lesions, no exudates. Neck is supple no lymphadenopathy. No bruits, no goiter. Heart: Tachycardic, without murmurs, rubs, gallops. Chest wall is nontender to palpation Lungs: Clear to auscultation bilaterally. Abdomen: Soft, nontender, nondistended, positive bowel sounds. Extremities: No clubbing cyanosis or edema with positive pedal pulses. Neuro: Alert and oriented x3. Cranial nerves II through XII are grossly intact. No focal deficits.     EKG: Sinus tachycardia  No results found for this or any previous visit (from the past 240 hour(s)).   Lab results:  Basename 02/21/12 1155  NA 137  K 3.2*  CL 101  CO2 25  GLUCOSE 84  BUN 13  CREATININE 0.93  CALCIUM 9.3  MG --  PHOS --   No results found for this basename: AST:2,ALT:2,ALKPHOS:2,BILITOT:2,PROT:2,ALBUMIN:2 in the last 72 hours No results found for this basename: LIPASE:2,AMYLASE:2 in the last 72 hours  Basename 02/21/12 1155  WBC 15.5*  NEUTROABS --  HGB 9.8*  HCT 29.6*  MCV 85.1  PLT 366    Basename 02/21/12 1156  CKTOTAL 239*  CKMB 0.8  CKMBINDEX --  TROPONINI --   No components found with this basename: POCBNP:3  Basename 02/21/12 1155  DDIMER 0.90*   No results found for this basename: HGBA1C:2 in the last 72 hours No results found for this basename: CHOL:2,HDL:2,LDLCALC:2,TRIG:2,CHOLHDL:2,LDLDIRECT:2 in the last 72 hours No results found for this basename: TSH,T4TOTAL,FREET3,T3FREE,THYROIDAB in the last 72 hours No results found for this basename: VITAMINB12:2,FOLATE:2,FERRITIN:2,TIBC:2,IRON:2,RETICCTPCT:2 in the last 72 hours Imaging results:  Dg Chest 2 View  02/21/2012  *RADIOLOGY REPORT*  Clinical Data: Chest pain.  CHEST - 2 VIEW  Comparison: 11/15/2011  Findings: The heart size and pulmonary vascularity are normal and  the lungs are clear.  No osseous abnormality.  IMPRESSION: Normal chest.  Original Report Authenticated By: Gwynn Burly, M.D.   Ct Angio Chest W/cm &/or Wo Cm  02/21/2012  *RADIOLOGY REPORT*  Clinical Data: Chest pain, shortness of breath.  CT ANGIOGRAPHY CHEST  Technique:  Multidetector CT imaging of the chest using the standard protocol during bolus administration of intravenous contrast. Multiplanar reconstructed images including MIPs were obtained and reviewed to evaluate the vascular anatomy.  Contrast: 60mL OMNIPAQUE IOHEXOL 350 MG/ML SOLN  Comparison:  02/21/2012 radiograph  Findings: Respiratory motion degrades detailed evaluation of the lower lobe branches.  Allowing for this, no pulmonary arterial branch filling defect.  Normal caliber aorta.  Heart size upper normal limits.  No pleural or pericardial effusion.  No intrathoracic lymphadenopathy.  Limited images through the upper abdomen show no acute abnormality. Status post cholecystectomy.  Respiratory motion degrades detailed evaluation of the lung bases. Mild mosaic attenuation at the bases may reflect atelectasis and/or air trapping.  No focal consolidation. no pneumothorax.  Central airways are patent.  IMPRESSION: Respiratory motion degrades detailed evaluation of the lower lobe branches.  Allowing for this, no pulmonary embolism identified.  Mild mosaic attenuation at the bases may reflect atelectasis and/or air trapping.  Otherwise, no acute intrathoracic process identified.  Original Report Authenticated By: Waneta Martins, M.D.   Impression/Plan:  Principal Problem:  *Chest pain  Active Problems:  Orthostasis  Tachycardia  Hypokalemia  Leukocytosis  Diarrhea  Migraines  Anemia  GERD (gastroesophageal reflux disease)  HTN (hypertension)  Diabetes mellitus   #1 chest pain Patient's description of chest pain has both atypical and typical features. Patient patient's chest and a small right-sided. Patient however does  have risk factors of hypertension, diabetes, family history of coronary artery disease and a such will admit the patient to telemetry bed. CT of the chest which was done was negative for PE. We'll place patient on telemetry, we'll cycle cardiac enzymes, will check a 2-D echo. We'll place on oxygen, aspirin ,nitroglycerin as needed and morphine as needed. Continue low-dose propranolol. Will follow. If patient rules out a 2-D echo was normal will likely benefit from outpatient stress test.  #2 orthostasis Likely secondary to GI losses from recent diarrhea in the setting of diuretic therapy. Will hydrate patient with IV fluids. Will hold patient's antihypertensive medications. We'll monitor follow.  #3 sinus tachycardia Likely secondary to problem #2. EKG shows a sinus tachycardia. Will check a TSH. Will cycle cardiac enzymes every 8 hours x3. Will check a 2-D echo. Will hydrate with IV fluids and follow.  #4 hypokalemia Likely secondary to GI losses and the second setting of diuretic therapy. Will check a magnesium level. Will replete.  #5 diarrhea Will check a C. difficile PCR. If negative we'll place on Imodium as needed.  #6 leukocytosis Questionable etiology. Chest x-ray is negative. Urinalysis was negative. Patient is afebrile. Will check a C. difficile PCR. And follow. If C. difficile PCR is positive we'll place on Flagyl. No need for antibiotics at this time.  #7 migraines Stable. Excedrin as needed. Continue Toprol.  #8 gastroesophageal reflux disease PPI.  #9 diabetes mellitus Will check a hemoglobin A1c. Will hold patient's oral hypoglycemics. We'll place on a sliding scale insulin.  #10 anemia Patient with no overt GI bleed. Patient's hemoglobin seems stable since March of 2013. Will check an anemia panel. Will follow H&H.  #11 prophylaxis PPI for GI prophylaxis. Lovenox for DVT prophylaxis.   Ramal Eckhardt [phone] 02/21/2012, 5:26 PM

## 2012-02-21 NOTE — ED Notes (Addendum)
Pt from home.  CP that started at 4am this morning.  Nauseated, no vomiting.  Denies SOB.  Hx of same-GERD.  No EKG changes.  20ga RAC.  2SL nitro, 324 asa-pain decreased from 8/10 to 4/10 after nitro.  Vitals stable.  CBG-94.

## 2012-02-21 NOTE — ED Notes (Signed)
Patient transported to X-ray 

## 2012-02-21 NOTE — ED Provider Notes (Signed)
History     CSN: [id number]  Arrival date & time 02/21/12  1135   First MD Initiated Contact with Patient 02/21/12 1139      Chief Complaint  Patient presents with  . Chest Pain    (Consider location/radiation/quality/duration/timing/severity/associated sxs/prior treatment) Patient is a 41 y.o. female presenting with chest pain. The history is provided by the patient.  Chest Pain The chest pain began 6 - 12 hours ago. Chest pain occurs constantly. The chest pain is improving. At its most intense, the pain is at 9/10. The pain is currently at 5/10. Primary symptoms include shortness of breath. Pertinent negatives for primary symptoms include no fever, no cough, no wheezing, no palpitations, no abdominal pain, no nausea, no vomiting and no dizziness.  Pertinent negatives for associated symptoms include no weakness.   Pt states she was awaken from sleep by substernal chest pain, shortness of breath, nausea. States pain was sharp. Not pleuritic. States SOB improved with nitro given in EMS. No cardiac hx. Denies fever, chills, cough, abdominal pain.   Past Medical History  Diagnosis Date  . Diabetes mellitus   . Hypertension   . Bronchitis   . Arthritis   . GERD (gastroesophageal reflux disease)     Past Surgical History  Procedure Date  . Cholecystectomy     History reviewed. No pertinent family history.  History  Substance Use Topics  . Smoking status: Never Smoker   . Smokeless tobacco: Not on file  . Alcohol Use: No    OB History    Grav Para Term Preterm Abortions TAB SAB Ect Mult Living                  Review of Systems  Constitutional: Negative for fever and chills.  HENT: Negative for neck pain and neck stiffness.   Respiratory: Positive for chest tightness and shortness of breath. Negative for cough, wheezing and stridor.   Cardiovascular: Positive for chest pain. Negative for palpitations and leg swelling.  Gastrointestinal: Negative for nausea, vomiting  and abdominal pain.  Genitourinary: Negative for dysuria.  Musculoskeletal: Negative for back pain.  Skin: Negative.   Neurological: Negative for dizziness and weakness.    Allergies  Penicillins  Home Medications   Current Outpatient Rx  Name Route Sig Dispense Refill  . ALBUTEROL SULFATE HFA 108 (90 BASE) MCG/ACT IN AERS Inhalation Inhale 2 puffs into the lungs every 6 (six) hours as needed. For wheezing    . ASPIRIN-ACETAMINOPHEN-CAFFEINE 250-250-65 MG PO TABS Oral Take 1 tablet by mouth every 6 (six) hours as needed. For migraines    . INSULIN ASPART 100 UNIT/ML [place] SOLN Subcutaneous Inject 4-15 Units into the skin 3 (three) times daily before meals. Sliding scale insulin    . LISINOPRIL-HYDROCHLOROTHIAZIDE 10-12.5 MG PO TABS Oral Take 1 tablet by mouth daily.    Marland Kitchen METFORMIN HCL 1000 MG PO TABS Oral Take 2,000 mg by mouth at bedtime.    . OMEPRAZOLE 20 MG PO CPDR Oral Take 20 mg by mouth 2 (two) times daily.    Marland Kitchen PROPRANOLOL HCL 10 MG PO TABS Oral Take 10 mg by mouth at bedtime.      BP 132/86  Temp 97.8 F (36.6 C) (Oral)  Resp 16  SpO2 99%  LMP 01/29/2012  Physical Exam  Nursing note and vitals reviewed. Constitutional: She is oriented to person, place, and time. She appears well-developed and well-nourished. No distress.  HENT:  Head: Normocephalic.  Eyes: Conjunctivae are normal.  Neck:  Neck supple.  Cardiovascular: Regular rhythm.   No murmur heard.      tachycardic  Pulmonary/Chest: Effort normal and breath sounds normal. No respiratory distress. She has no wheezes. She has no rales. She exhibits no tenderness.  Abdominal: Soft. Bowel sounds are normal. She exhibits no distension. There is no tenderness.  Musculoskeletal: She exhibits no edema.       No calf swelling or tenderness  Neurological: She is alert and oriented to person, place, and time.  Skin: Skin is warm and dry.  Psychiatric: She has a normal mood and affect.    ED Course  Procedures  (including critical care time)  Atypical CP, however, pt has risk factors for cardiac disease, diabetic, HTN, family hx of disease. Will get labs, CXR.  Pt received asa and nitro by ems   Date: 02/21/2012  Rate: 112  Rhythm: sinus tachycardia  QRS Axis: right  Intervals: normal  ST/T Wave abnormalities: normal  Conduction Disutrbances:none  Narrative Interpretation:   Old EKG Reviewed: unchanged    Results for orders placed during the hospital encounter of 02/21/12  CBC      Component Value Range   WBC 15.5 (*) 4.0 - 10.5 K/uL   RBC 3.48 (*) 3.87 - 5.11 MIL/uL   Hemoglobin 9.8 (*) 12.0 - 15.0 g/dL   HCT 98.1 (*) 19.1 - 47.8 %   MCV 85.1  78.0 - 100.0 fL   MCH 28.2  26.0 - 34.0 pg   MCHC 33.1  30.0 - 36.0 g/dL   RDW 29.5  62.1 - 30.8 %   Platelets 366  150 - 400 K/uL  BASIC METABOLIC PANEL      Component Value Range   Sodium 137  135 - 145 mEq/L   Potassium 3.2 (*) 3.5 - 5.1 mEq/L   Chloride 101  96 - 112 mEq/L   CO2 25  19 - 32 mEq/L   Glucose, Bld 84  70 - 99 mg/dL   BUN 13  6 - 23 mg/dL   Creatinine, Ser 6.57  0.50 - 1.10 mg/dL   Calcium 9.3  8.4 - 84.6 mg/dL   GFR calc non Af Amer 76 (*) >90 mL/min   GFR calc Af Amer 89 (*) >90 mL/min  CK TOTAL AND CKMB      Component Value Range   Total CK 239 (*) 7 - 177 U/L   CK, MB 0.8  0.3 - 4.0 ng/mL   Relative Index 0.3  0.0 - 2.5  D-DIMER, QUANTITATIVE      Component Value Range   D-Dimer, Quant 0.90 (*) 0.00 - 0.48 ug/mL-FEU  URINALYSIS, ROUTINE W REFLEX MICROSCOPIC      Component Value Range   Color, Urine YELLOW  YELLOW   APPearance CLEAR  CLEAR   Specific Gravity, Urine 1.027  1.005 - 1.030   pH 6.0  5.0 - 8.0   Glucose, UA NEGATIVE  NEGATIVE mg/dL   Hgb urine dipstick NEGATIVE  NEGATIVE   Bilirubin Urine NEGATIVE  NEGATIVE   Ketones, ur NEGATIVE  NEGATIVE mg/dL   Protein, ur NEGATIVE  NEGATIVE mg/dL   Urobilinogen, UA 0.2  0.0 - 1.0 mg/dL   Nitrite NEGATIVE  NEGATIVE   Leukocytes, UA SMALL (*) NEGATIVE    POCT I-STAT TROPONIN I      Component Value Range   Troponin i, poc 0.00  0.00 - 0.08 ng/mL   Comment 3           POCT PREGNANCY, URINE  Component Value Range   Preg Test, Ur NEGATIVE  NEGATIVE  URINE MICROSCOPIC-ADD ON      Component Value Range   Squamous Epithelial / LPF FEW (*) RARE   WBC, UA 3-6  <3 WBC/hpf   Bacteria, UA RARE  RARE   Casts HYALINE CASTS (*) NEGATIVE  POCT I-STAT TROPONIN I      Component Value Range   Troponin i, poc 0.00  0.00 - 0.08 ng/mL   Comment 3            Dg Chest 2 View  02/21/2012  *RADIOLOGY REPORT*  Clinical Data: Chest pain.  CHEST - 2 VIEW  Comparison: 11/15/2011  Findings: The heart size and pulmonary vascularity are normal and the lungs are clear.  No osseous abnormality.  IMPRESSION: Normal chest.  Original Report Authenticated By: Gwynn Burly, M.D.   Ct Angio Chest W/cm &/or Wo Cm  02/21/2012  *RADIOLOGY REPORT*  Clinical Data: Chest pain, shortness of breath.  CT ANGIOGRAPHY CHEST  Technique:  Multidetector CT imaging of the chest using the standard protocol during bolus administration of intravenous contrast. Multiplanar reconstructed images including MIPs were obtained and reviewed to evaluate the vascular anatomy.  Contrast: 60mL OMNIPAQUE IOHEXOL 350 MG/ML SOLN  Comparison:  02/21/2012 radiograph  Findings: Respiratory motion degrades detailed evaluation of the lower lobe branches.  Allowing for this, no pulmonary arterial branch filling defect.  Normal caliber aorta.  Heart size upper normal limits.  No pleural or pericardial effusion.  No intrathoracic lymphadenopathy.  Limited images through the upper abdomen show no acute abnormality. Status post cholecystectomy.  Respiratory motion degrades detailed evaluation of the lung bases. Mild mosaic attenuation at the bases may reflect atelectasis and/or air trapping.  No focal consolidation. no pneumothorax.  Central airways are patent.  IMPRESSION: Respiratory motion degrades detailed  evaluation of the lower lobe branches.  Allowing for this, no pulmonary embolism identified.  Mild mosaic attenuation at the bases may reflect atelectasis and/or air trapping.  Otherwise, no acute intrathoracic process identified.  Original Report Authenticated By: Waneta Martins, M.D.    CT agio obtained due to elevated D dimer. Pt tachycardic, orthostatic. Received 2L of NS, ativan, toradol. CP resolved. Pt continues to be tachycardic. Considered CP protocol, but unable to do on the weekend Will admit for r/o and further evaluation of pt's tachycardia.    Spoke with [facility] hospitalist will admit   1. Chest pain   2. Tachycardia       MDM          Lottie Mussel, PA 02/21/12 1627  Lottie Mussel, PA 02/21/12 1628

## 2012-02-21 NOTE — ED Notes (Signed)
Pt. comfortable. Talking on the phone with family, resting, and watching television.

## 2012-02-22 DIAGNOSIS — R072 Precordial pain: Secondary | ICD-10-CM

## 2012-02-22 DIAGNOSIS — E876 Hypokalemia: Secondary | ICD-10-CM

## 2012-02-22 DIAGNOSIS — R197 Diarrhea, unspecified: Secondary | ICD-10-CM

## 2012-02-22 DIAGNOSIS — R079 Chest pain, unspecified: Secondary | ICD-10-CM

## 2012-02-22 DIAGNOSIS — I951 Orthostatic hypotension: Secondary | ICD-10-CM

## 2012-02-22 LAB — HEMOGLOBIN A1C: Mean Plasma Glucose: 123 mg/dL — ABNORMAL HIGH (ref <117)

## 2012-02-22 LAB — CARDIAC PANEL(CRET KIN+CKTOT+MB+TROPI)
CK, MB: 0.9 ng/mL (ref 0.3–4.0)
CK, MB: 0.8 ng/mL (ref 0.3–4.0)
Relative Index: 0.6 (ref 0.0–2.5)
Troponin I: <0.3 ng/mL (ref <0.30)
Troponin I: <0.3 ng/mL (ref <0.30)

## 2012-02-22 LAB — IRON AND TIBC
Iron: 17 ug/dL — ABNORMAL LOW (ref 42–135)
Saturation Ratios: 4 % — ABNORMAL LOW (ref 20–55)
TIBC: 412 ug/dL (ref 250–470)

## 2012-02-22 LAB — CBC
HCT: 28.3 % — ABNORMAL LOW (ref 36.0–46.0)
Hemoglobin: 9.1 g/dL — ABNORMAL LOW (ref 12.0–15.0)
RBC: 3.31 MIL/uL — ABNORMAL LOW (ref 3.87–5.11)
WBC: 9 10*3/uL (ref 4.0–10.5)

## 2012-02-22 LAB — GLUCOSE, CAPILLARY
Glucose-Capillary: 95 mg/dL (ref 70–99)
Glucose-Capillary: 79 mg/dL (ref 70–99)
Glucose-Capillary: 91 mg/dL (ref 70–99)

## 2012-02-22 LAB — BASIC METABOLIC PANEL
Chloride: 107 mEq/L (ref 96–112)
GFR calc Af Amer: 89 mL/min — ABNORMAL LOW (ref >90)
Potassium: 3.9 mEq/L (ref 3.5–5.1)

## 2012-02-22 LAB — URINALYSIS, ROUTINE W REFLEX MICROSCOPIC
Bilirubin Urine: NEGATIVE
Nitrite: NEGATIVE
Protein, ur: NEGATIVE mg/dL
Urobilinogen, UA: 0.2 mg/dL (ref 0.0–1.0)

## 2012-02-22 LAB — HEPATIC FUNCTION PANEL
Albumin: 2.9 g/dL — ABNORMAL LOW (ref 3.5–5.2)
Alkaline Phosphatase: 67 U/L (ref 39–117)
Total Protein: 6.6 g/dL (ref 6.0–8.3)

## 2012-02-22 LAB — FOLATE: Folate: 14.5 ng/mL

## 2012-02-22 LAB — VITAMIN B12: Vitamin B-12: 480 pg/mL (ref 211–911)

## 2012-02-22 LAB — TSH: TSH: 0.469 u[IU]/mL (ref 0.350–4.500)

## 2012-02-22 MED ORDER — PROPRANOLOL HCL 10 MG PO TABS: 10.0000 mg | ORAL_TABLET | Freq: Two times a day (BID) | ORAL | Status: DC

## 2012-02-22 MED ORDER — PROPRANOLOL HCL 10 MG PO TABS
10.0000 mg | ORAL_TABLET | Freq: Two times a day (BID) | ORAL | Status: DC
  Administered 2012-02-22 – 2012-02-24 (×5): 10 mg via ORAL
  Filled 2012-02-22 (×6): qty 1

## 2012-02-22 MED ORDER — ENSURE COMPLETE PO LIQD
237.0000 mL | Freq: Two times a day (BID) | ORAL | Status: DC
  Administered 2012-02-23 – 2012-02-24 (×3): 237 mL via ORAL

## 2012-02-22 MED ORDER — LOPERAMIDE HCL 2 MG PO CAPS: 2.0000 mg | ORAL_CAPSULE | ORAL | Status: DC | PRN

## 2012-02-22 NOTE — Evaluation (Signed)
Physical Therapy Evaluation Patient Details Name: Carla Brooks MRN: [id number] DOB: November 24, 1970 Today's Date: 02/22/2012 Time: 9629-5284 PT Time Calculation (min): 22 min  PT Assessment / Plan / Recommendation Clinical Impression  Patient is a 41 yo female admitted with chest pain, nausea/diarrhea.  Patient with good bed mobility.  During gait, noted slightly decreased balance.  Will perform formal balance assessment at next visit.  Patient has support at home 24 hours.  Do not anticipate any f/u PT or equipment needs.  Will follow acutely to fully evaluate balance.    PT Assessment  Patient needs continued PT services    Follow Up Recommendations  No PT follow up;Supervision/Assistance - 24 hour    Barriers to Discharge None      lEquipment Recommendations  None recommended by PT    Recommendations for Other Services     Frequency Min 3X/week    Precautions / Restrictions Precautions Precautions: None Restrictions Weight Bearing Restrictions: No         Mobility  Bed Mobility Bed Mobility: Supine to Sit;Sitting - Scoot to Edge of Bed Supine to Sit: 6: Modified independent (Device/Increase time);With rails Sitting - Scoot to Edge of Bed: 7: Independent Details for Bed Mobility Assistance: No cues needed. Transfers Transfers: Sit to Stand;Stand to Sit Sit to Stand: 5: Supervision;With upper extremity assist;From bed Stand to Sit: 5: Supervision;With upper extremity assist;With armrests;To chair/3-in-1 Details for Transfer Assistance: Cues for safety and hand placement Ambulation/Gait Ambulation/Gait Assistance: 5: Supervision Ambulation Distance (Feet): 180 Feet Assistive device: None Ambulation/Gait Assistance Details: Slightly unsteady with gait.  Staggering gait x2 during ambulation. Gait Pattern: Step-through pattern (Slightly unsteady) Gait velocity: Slow gait speed    Exercises     PT Diagnosis: Abnormality of gait;Generalized weakness;Acute pain  PT  Problem List: Decreased activity tolerance;Decreased balance;Pain PT Treatment Interventions: Gait training;Stair training;Balance training;Patient/family education   PT Goals Acute Rehab PT Goals PT Goal Formulation: With patient Time For Goal Achievement: 02/29/12 Potential to Achieve Goals: Good Pt will go Sit to Stand: Independently PT Goal: Sit to Stand - Progress: Goal set today Pt will go Stand to Sit: Independently PT Goal: Stand to Sit - Progress: Goal set today Pt will Ambulate: >150 feet;Independently (without loss of balance) PT Goal: Ambulate - Progress: Goal set today Pt will Go Up / Down Stairs: 3-5 stairs;with supervision PT Goal: Up/Down Stairs - Progress: Goal set today Additional Goals Additional Goal #1: Patient will score > 20 on DGI to indicate low fall risk. PT Goal: Additional Goal #1 - Progress: Goal set today  Visit Information  Last PT Received On: 02/22/12 Assistance Needed: +1    Subjective Data  Subjective: "My arm is hurting a little now."  "I have a good support system at home with family." Patient Stated Goal: To return home   Prior Functioning  Home Living Lives With: Son;Daughter Available Help at Discharge: Family (Mother and uncle provide transport.) Type of Home: House Home Access: Stairs to enter Secretary/administrator of Steps: 3 Entrance Stairs-Rails: None Home Layout: One level Bathroom Shower/Tub: Network engineer: None Prior Function Level of Independence: Independent Able to Take Stairs?: Yes Driving: No Vocation: On disability Communication Communication: No difficulties    Cognition  Overall Cognitive Status: Appears within functional limits for tasks assessed/performed Arousal/Alertness: Awake/alert Orientation Level: Oriented X4 / Intact Behavior During Session: [facility] for tasks performed    Extremity/Trunk Assessment Right Upper Extremity Assessment RUE  ROM/Strength/Tone: [facility] for tasks assessed Left  Upper Extremity Assessment LUE ROM/Strength/Tone: WFL for tasks assessed Right Lower Extremity Assessment RLE ROM/Strength/Tone: [facility] for tasks assessed Left Lower Extremity Assessment LLE ROM/Strength/Tone: [facility] for tasks assessed   Balance Balance Balance Assessed: Yes High Level Balance High Level Balance Activites: Direction changes;Turns;Head turns High Level Balance Comments: Noted decreased balance with high level balance activities.  Patient reaching for rail in hallway for balance.  End of Session PT - End of Session Equipment Utilized During Treatment: Gait belt Activity Tolerance: Patient limited by fatigue Patient left: in chair;with call bell/phone within reach Nurse Communication: Mobility status   Vena Austria 02/22/2012, 2:42 PM Durenda Hurt. Renaldo Fiddler, [facility] Acute Rehab Services Pager [phone]

## 2012-02-22 NOTE — Progress Notes (Signed)
Subjective: Patient states CP improving. NO SOB. Patient c/o nausea this morning has since resolved. Patient still with diarrhea.  Objective: Vital signs in last 24 hours: Filed Vitals:   02/22/12 0810 02/22/12 1402 02/22/12 1411 02/22/12 1442  BP: 142/96 127/84 141/94   Pulse: 106 94 102   Temp:    98.2 F (36.8 C)  TempSrc:      Resp:      Height:      Weight:      SpO2:   100%     Intake/Output Summary (Last 24 hours) at 02/22/12 1446 Last data filed at 02/22/12 1610  Gross per 24 hour  Intake    645 ml  Output    650 ml  Net     -5 ml    Weight change:   General: Alert, awake, oriented x3, in no acute distress. HEENT: No bruits, no goiter. Heart: Regular rate and rhythm, without murmurs, rubs, gallops. Lungs: Clear to auscultation bilaterally. Abdomen: Soft, nontender, nondistended, positive bowel sounds. Extremities: No clubbing cyanosis or edema with positive pedal pulses. Neuro: Grossly intact, nonfocal.    Lab Results:  Basename 02/22/12 0158 02/21/12 1900 02/21/12 1155  NA 140 -- 137  K 3.9 -- 3.2*  CL 107 -- 101  CO2 22 -- 25  GLUCOSE 97 -- 84  BUN 11 -- 13  CREATININE 0.93 0.90 --  CALCIUM 8.3* -- 9.3  MG -- 1.7 --  PHOS -- -- --    Basename 02/22/12 0158  AST 12  ALT 9  ALKPHOS 67  BILITOT 0.3  PROT 6.6  ALBUMIN 2.9*   No results found for this basename: LIPASE:2,AMYLASE:2 in the last 72 hours  Basename 02/22/12 0158 02/21/12 1900  WBC 9.0 9.5  NEUTROABS -- --  HGB 9.1* 9.1*  HCT 28.3* 28.1*  MCV 85.5 84.4  PLT 335 319    Basename 02/22/12 1040 02/22/12 0158 02/21/12 1900  CKTOTAL 114 147 180*  CKMB 0.8 0.9 0.9  CKMBINDEX -- -- --  TROPONINI <0.30 <0.30 <0.30   No components found with this basename: POCBNP:3  Basename 02/21/12 1155  DDIMER 0.90*    Basename 02/21/12 1755  HGBA1C 5.9*   No results found for this basename: CHOL:2,HDL:2,LDLCALC:2,TRIG:2,CHOLHDL:2,LDLDIRECT:2 in the last 72 hours  Basename 02/21/12 1900   TSH 0.469  T4TOTAL --  T3FREE --  THYROIDAB --    Basename 02/21/12 1900  VITAMINB12 480  FOLATE 14.5  FERRITIN 54  TIBC 412  IRON 17*  RETICCTPCT --    Micro Results: Recent Results (from the past 240 hour(s))  CLOSTRIDIUM DIFFICILE BY PCR     Status: Normal   Collection Time   02/21/12  7:56 PM      Component Value Range Status Comment   C difficile by pcr NEGATIVE  NEGATIVE Final     Studies/Results: Dg Chest 2 View  02/21/2012  *RADIOLOGY REPORT*  Clinical Data: Chest pain.  CHEST - 2 VIEW  Comparison: 11/15/2011  Findings: The heart size and pulmonary vascularity are normal and the lungs are clear.  No osseous abnormality.  IMPRESSION: Normal chest.  Original Report Authenticated By: Gwynn Burly, M.D.   Ct Angio Chest W/cm &/or Wo Cm  02/21/2012  *RADIOLOGY REPORT*  Clinical Data: Chest pain, shortness of breath.  CT ANGIOGRAPHY CHEST  Technique:  Multidetector CT imaging of the chest using the standard protocol during bolus administration of intravenous contrast. Multiplanar reconstructed images including MIPs were obtained and reviewed to evaluate the  vascular anatomy.  Contrast: 60mL OMNIPAQUE IOHEXOL 350 MG/ML SOLN  Comparison:  02/21/2012 radiograph  Findings: Respiratory motion degrades detailed evaluation of the lower lobe branches.  Allowing for this, no pulmonary arterial branch filling defect.  Normal caliber aorta.  Heart size upper normal limits.  No pleural or pericardial effusion.  No intrathoracic lymphadenopathy.  Limited images through the upper abdomen show no acute abnormality. Status post cholecystectomy.  Respiratory motion degrades detailed evaluation of the lung bases. Mild mosaic attenuation at the bases may reflect atelectasis and/or air trapping.  No focal consolidation. no pneumothorax.  Central airways are patent.  IMPRESSION: Respiratory motion degrades detailed evaluation of the lower lobe branches.  Allowing for this, no pulmonary embolism  identified.  Mild mosaic attenuation at the bases may reflect atelectasis and/or air trapping.  Otherwise, no acute intrathoracic process identified.  Original Report Authenticated By: Waneta Martins, M.D.    Medications:     . sodium chloride   Intravenous STAT  . aspirin  325 mg Oral Daily  . enoxaparin  40 mg Subcutaneous Q24H  . insulin aspart  0-15 Units Subcutaneous TID WC  . pantoprazole  40 mg Oral Q0600  . potassium chloride  40 mEq Oral Q4H  . propranolol  10 mg Oral BID  . sodium chloride  3 mL Intravenous Q12H  . DISCONTD: aspirin EC  81 mg Oral Daily  . DISCONTD: pantoprazole  40 mg Oral Q0600  . DISCONTD: propranolol  10 mg Oral QHS  . DISCONTD: propranolol  10 mg Oral BID    Assessment: Principal Problem:  *Chest pain Active Problems:  Orthostasis  Tachycardia  Hypokalemia  Leukocytosis  Diarrhea  Migraines  Anemia  GERD (gastroesophageal reflux disease)  HTN (hypertension)  Diabetes mellitus   Plan: #1 chest pain  Patient's description of chest pain has both atypical and typical features. Patient patient's chest and a small right-sided. Patient however does have risk factors of hypertension, diabetes, family history of coronary artery disease and a such will admit the patient to telemetry bed. CT of the chest which was done was negative for PE. We'll place patient on telemetry. Cardiac enzymes neg x 3. 2-D echo pending. Continue oxygen, aspirin ,nitroglycerin as needed and morphine as needed. Continue low-dose propranolol. Will follow. If patient rules out a 2-D echo was normal will likely benefit from outpatient stress test.  #2 orthostasis  Likely secondary to GI losses from recent diarrhea in the setting of diuretic therapy. Improving. Will hydrate patient with IV fluids. Will hold patient's antihypertensive medications. We'll monitor follow.  #3 sinus tachycardia  Likely secondary to problem #2. EKG shows a sinus tachycardia. TSH WNL.  Cardiac enzymes  neg x3. 2-D echo pending. Continue IVF. Increase propranolol to BID. #4 hypokalemia  Likely secondary to GI losses and the second setting of diuretic therapy. Will check a magnesium level. Repleted.  #5 diarrhea  C. difficile PCR negative.  Imodium as needed.  #6 leukocytosis  Questionable etiology. Likely reactive. Chest x-ray is negative. Urinalysis was negative. Patient is afebrile. C. difficile PCR negative. WBC trending down. No need for antibiotics at this time.  #7 migraines  Stable. Excedrin as needed. Continue propranolol.  #8 gastroesophageal reflux disease  PPI.  #9 Well controlled diabetes mellitus  Hemoglobin A1c = 5.9. CBG 79 -106 Will hold patient's oral hypoglycemics. SSI #10 Iron deficiency anemia  Patient with no overt GI bleed. Patient's hemoglobin seems stable since March of 2013. H/H stable.  #11 prophylaxis  PPI  for GI prophylaxis. Lovenox for DVT prophylaxis.       LOS: 1 day   Breaker Springer [phone] 02/22/2012, 2:46 PM

## 2012-02-22 NOTE — Progress Notes (Signed)
INITIAL ADULT NUTRITION ASSESSMENT Date: 02/22/2012   Time: 2:40 PM Reason for Assessment: Nutrition Risk-wt loss  ASSESSMENT: Female 41 y.o.  Dx: Chest pain  Hx:  Past Medical History  Diagnosis Date  . Diabetes mellitus   . Hypertension   . Bronchitis   . Arthritis   . GERD (gastroesophageal reflux disease)   . Asthma   . Migraines 02/21/2012  . Anemia 02/21/2012  . HTN (hypertension) 02/21/2012  . Diabetes mellitus 02/21/2012    Related Meds:     . sodium chloride   Intravenous STAT  . aspirin  325 mg Oral Daily  . enoxaparin  40 mg Subcutaneous Q24H  . insulin aspart  0-15 Units Subcutaneous TID WC  . pantoprazole  40 mg Oral Q0600  . potassium chloride  40 mEq Oral Q4H  . propranolol  10 mg Oral BID  . sodium chloride  3 mL Intravenous Q12H  . DISCONTD: aspirin EC  81 mg Oral Daily  . DISCONTD: pantoprazole  40 mg Oral Q0600  . DISCONTD: propranolol  10 mg Oral QHS  . DISCONTD: propranolol  10 mg Oral BID    Ht: 5\' 9"  (175.3 cm)  Wt: 202 lb 2.6 oz (91.7 kg) (a scale-rn notifed of weight gain)  Ideal Wt: 65.9 kg % Ideal Wt: 139%  Usual Wt: 233 lbs 12/12 Wt Readings from Last 10 Encounters:  02/22/12 202 lb 2.6 oz (91.7 kg)  11/26/11 195 lb (88.451 kg)  11/15/11 210 lb (95.255 kg)  10/03/11 218 lb (98.884 kg)    % Usual Wt: 87%  Body mass index is 29.85 kg/(m^2). Overweight  Food/Nutrition Related Hx: Pt reports 13% wt loss due to poor appetite. Pt states that she has has multiple visits to the ED in the last 6 months. Pt with poor dentition. Pt with diarrhea for 3 days but not usually a problem.  Pt meets criteria for severe malnutrition in the context of chronic illness as evidenced by 13% wt loss in the last 6 months as well as poor intake. Pt meeting </= 75% of her estimated needs for >/= 1 month.  Labs:  CMP     Component Value Date/Time   NA 140 02/22/2012 0158   K 3.9 02/22/2012 0158   CL 107 02/22/2012 0158   CO2 22 02/22/2012 0158   GLUCOSE  97 02/22/2012 0158   BUN 11 02/22/2012 0158   CREATININE 0.93 02/22/2012 0158   CALCIUM 8.3* 02/22/2012 0158   PROT 6.6 02/22/2012 0158   ALBUMIN 2.9* 02/22/2012 0158   AST 12 02/22/2012 0158   ALT 9 02/22/2012 0158   ALKPHOS 67 02/22/2012 0158   BILITOT 0.3 02/22/2012 0158   GFRNONAA 76* 02/22/2012 0158   GFRAA 89* 02/22/2012 0158   CBG (last 3)   Basename 02/22/12 1616 02/22/12 1103 02/22/12 0627  GLUCAP 74 79 95    Lab Results  Component Value Date   HGBA1C 5.9* 02/21/2012    Intake/Output Summary (Last 24 hours) at 02/22/12 1631 Last data filed at 02/22/12 1518  Gross per 24 hour  Intake    885 ml  Output    650 ml  Net    235 ml    Diet Order: CHO Modified Medium  Supplements/Tube Feeding: none  IVF:    sodium chloride Last Rate: 1,000 mL (02/22/12 1233)   Pt admitted with chest pain. Pt also with hypokalemia which is being repleted. Pt continues to have diarrhea, cdiff negative.   Estimated Nutritional Needs:  Kcal:  1800-2000 Protein:  90-100 grams Fluid:  >2 L/day  NUTRITION DIAGNOSIS: -Unintended wt loss Status:  Ongoing  RELATED TO: poor appetite  AS EVIDENCE BY: 13% wt loss in the last 6 months  MONITORING/EVALUATION(Goals): Goal: Pt to meet >/= 90% of their estimated nutrition needs Monitor: po intake, weight, labs  EDUCATION NEEDS: -No education needs identified at this time, pt declines DM education at this time  INTERVENTION: Ensure Complete BID, each supplement provides 350 kcal and 13 grams of protein   DOCUMENTATION CODES Per approved criteria  -Severe malnutrition in the context of chronic illness    Kendell Bane RD, LDN, CNSC [phone] weekend pager  02/22/2012, 2:40 PM

## 2012-02-22 NOTE — Progress Notes (Signed)
  Echocardiogram 2D Echocardiogram has been performed.  Georgian Co 02/22/2012, 3:26 PM

## 2012-02-22 NOTE — Progress Notes (Signed)
Pt [id number] asymptomatic, while coming form the bathroom.  BP162/96, p103.  Notified Dr. Janee Morn.  Will continue to monitor.  Amanda Pea, Charity fundraiser.

## 2012-02-23 ENCOUNTER — Inpatient Hospital Stay (HOSPITAL_COMMUNITY): Payer: Medicaid Other

## 2012-02-23 ENCOUNTER — Other Ambulatory Visit: Payer: Self-pay

## 2012-02-23 DIAGNOSIS — I951 Orthostatic hypotension: Secondary | ICD-10-CM

## 2012-02-23 DIAGNOSIS — R079 Chest pain, unspecified: Secondary | ICD-10-CM

## 2012-02-23 DIAGNOSIS — E876 Hypokalemia: Secondary | ICD-10-CM

## 2012-02-23 DIAGNOSIS — R197 Diarrhea, unspecified: Secondary | ICD-10-CM

## 2012-02-23 LAB — GLUCOSE, CAPILLARY
Glucose-Capillary: 95 mg/dL (ref 70–99)
Glucose-Capillary: 95 mg/dL (ref 70–99)
Glucose-Capillary: 129 mg/dL — ABNORMAL HIGH (ref 70–99)

## 2012-02-23 LAB — BASIC METABOLIC PANEL
Chloride: 104 mEq/L (ref 96–112)
GFR calc Af Amer: >90 mL/min (ref >90)
GFR calc non Af Amer: 85 mL/min — ABNORMAL LOW (ref >90)
Potassium: 3.3 mEq/L — ABNORMAL LOW (ref 3.5–5.1)
Sodium: 138 mEq/L (ref 135–145)

## 2012-02-23 LAB — URINE CULTURE

## 2012-02-23 MED ORDER — NITROGLYCERIN 0.4 MG SL SUBL
SUBLINGUAL_TABLET | SUBLINGUAL | Status: AC
  Filled 2012-02-23: qty 25

## 2012-02-23 MED ORDER — POTASSIUM CHLORIDE CRYS ER 20 MEQ PO TBCR
40.0000 meq | EXTENDED_RELEASE_TABLET | Freq: Once | ORAL | Status: AC
  Administered 2012-02-23: 40 meq via ORAL
  Filled 2012-02-23: qty 2

## 2012-02-23 MED ORDER — IOHEXOL 300 MG/ML  SOLN
100.0000 mL | Freq: Once | INTRAMUSCULAR | Status: AC | PRN
  Administered 2012-02-23: 100 mL via INTRAVENOUS

## 2012-02-23 MED ORDER — IOHEXOL 300 MG/ML  SOLN
20.0000 mL | INTRAMUSCULAR | Status: AC
  Administered 2012-02-23 (×2): 20 mL via ORAL

## 2012-02-23 NOTE — Plan of Care (Signed)
Problem: Phase III Progression Outcomes Goal: Activity at appropriate level-compared to baseline (UP IN CHAIR FOR HEMODIALYSIS)  Pt demos balance deficits and increased risk for falls. Recommend continued PT for balance/safety trg

## 2012-02-23 NOTE — Progress Notes (Signed)
Subjective: Patient states HAVING INTERMITTENT CP MIDSTERNAL AS IF SOMEONE SITTING ON HER CHEST.Patient with nausea and emesis overnight. Patient also with complaints of abdominal pain going to her back occurred this morning.  Objective: Vital signs in last 24 hours: Filed Vitals:   02/23/12 0154 02/23/12 0625 02/23/12 0730 02/23/12 1100  BP: 157/92 144/84 164/94 160/92  Pulse: 94 93 94 92  Temp: 99.7 F (37.6 C) 100 F (37.8 C) 100.3 F (37.9 C)   TempSrc: Oral Oral Oral   Resp: 20 18 19    Height:      Weight:  91.4 kg (201 lb 8 oz)    SpO2: 100% 98% 100%     Intake/Output Summary (Last 24 hours) at 02/23/12 1321 Last data filed at 02/23/12 1002  Gross per 24 hour  Intake    360 ml  Output      0 ml  Net    360 ml    Weight change: 1.1 kg (2 lb 6.8 oz)  General: Alert, awake, oriented x3, in no acute distress. HEENT: No bruits, no goiter. Heart: Regular rate and rhythm, without murmurs, rubs, gallops. Lungs: Clear to auscultation bilaterally. Abdomen: Soft, nontender, nondistended, positive bowel sounds. Extremities: No clubbing cyanosis or edema with positive pedal pulses.    Lab Results:  Basename 02/23/12 0656 02/22/12 0158 02/21/12 1900  NA 138 140 --  K 3.3* 3.9 --  CL 104 107 --  CO2 22 22 --  GLUCOSE 103* 97 --  BUN 5* 11 --  CREATININE 0.85 0.93 --  CALCIUM 8.7 8.3* --  MG -- -- 1.7  PHOS -- -- --    Basename 02/22/12 0158  AST 12  ALT 9  ALKPHOS 67  BILITOT 0.3  PROT 6.6  ALBUMIN 2.9*   No results found for this basename: LIPASE:2,AMYLASE:2 in the last 72 hours  Basename 02/22/12 0158 02/21/12 1900  WBC 9.0 9.5  NEUTROABS -- --  HGB 9.1* 9.1*  HCT 28.3* 28.1*  MCV 85.5 84.4  PLT 335 319    Basename 02/22/12 1040 02/22/12 0158 02/21/12 1900  CKTOTAL 114 147 180*  CKMB 0.8 0.9 0.9  CKMBINDEX -- -- --  TROPONINI <0.30 <0.30 <0.30   No components found with this basename: POCBNP:3  Basename 02/21/12 1155  DDIMER 0.90*     Basename 02/21/12 1755  HGBA1C 5.9*   No results found for this basename: CHOL:2,HDL:2,LDLCALC:2,TRIG:2,CHOLHDL:2,LDLDIRECT:2 in the last 72 hours  Basename 02/21/12 1900  TSH 0.469  T4TOTAL --  T3FREE --  THYROIDAB --    Basename 02/21/12 1900  VITAMINB12 480  FOLATE 14.5  FERRITIN 54  TIBC 412  IRON 17*  RETICCTPCT --    Micro Results: Recent Results (from the past 240 hour(s))  CLOSTRIDIUM DIFFICILE BY PCR     Status: Normal   Collection Time   02/21/12  7:56 PM      Component Value Range Status Comment   C difficile by pcr NEGATIVE  NEGATIVE Final   URINE CULTURE     Status: Normal   Collection Time   02/22/12  4:15 AM      Component Value Range Status Comment   Specimen Description URINE, CLEAN CATCH   Final    Special Requests NONE   Final    Culture  Setup Time [id number]   Final    Colony Count 40,000 COLONIES/ML   Final    Culture     Final    Value: Multiple bacterial morphotypes present, none predominant.  Suggest appropriate recollection if clinically indicated.   Report Status 02/23/2012 FINAL   Final     Studies/Results: Ct Angio Chest W/cm &/or Wo Cm  02/21/2012  *RADIOLOGY REPORT*  Clinical Data: Chest pain, shortness of breath.  CT ANGIOGRAPHY CHEST  Technique:  Multidetector CT imaging of the chest using the standard protocol during bolus administration of intravenous contrast. Multiplanar reconstructed images including MIPs were obtained and reviewed to evaluate the vascular anatomy.  Contrast: 60mL OMNIPAQUE IOHEXOL 350 MG/ML SOLN  Comparison:  02/21/2012 radiograph  Findings: Respiratory motion degrades detailed evaluation of the lower lobe branches.  Allowing for this, no pulmonary arterial branch filling defect.  Normal caliber aorta.  Heart size upper normal limits.  No pleural or pericardial effusion.  No intrathoracic lymphadenopathy.  Limited images through the upper abdomen show no acute abnormality. Status post cholecystectomy.  Respiratory  motion degrades detailed evaluation of the lung bases. Mild mosaic attenuation at the bases may reflect atelectasis and/or air trapping.  No focal consolidation. no pneumothorax.  Central airways are patent.  IMPRESSION: Respiratory motion degrades detailed evaluation of the lower lobe branches.  Allowing for this, no pulmonary embolism identified.  Mild mosaic attenuation at the bases may reflect atelectasis and/or air trapping.  Otherwise, no acute intrathoracic process identified.  Original Report Authenticated By: Waneta Martins, M.D.    Medications:     . aspirin  325 mg Oral Daily  . enoxaparin  40 mg Subcutaneous Q24H  . feeding supplement  237 mL Oral BID BM  . insulin aspart  0-15 Units Subcutaneous TID WC  . nitroGLYCERIN      . pantoprazole  40 mg Oral Q0600  . potassium chloride  40 mEq Oral Once  . propranolol  10 mg Oral BID  . sodium chloride  3 mL Intravenous Q12H    Assessment: Principal Problem:  *Chest pain Active Problems:  Orthostasis  Tachycardia  Hypokalemia  Leukocytosis  Diarrhea  Migraines  Anemia  GERD (gastroesophageal reflux disease)  HTN (hypertension)  Diabetes mellitus   Plan: #1 chest pain  Patient's description of chest pain has both atypical and typical features. Patient patient's chest and right-sided, now with intermittent midsternal chest pain described as if someone was sitting on her chest., . Patient however does have risk factors of hypertension, diabetes, family history of coronary artery disease and a such will admit the patient to telemetry bed. CT of the chest which was done was negative for PE. We'll place patient on telemetry. Cardiac enzymes neg x 3. 2-D echo with EF = 55-60% with NWMA. Continue oxygen, aspirin ,nitroglycerin as needed and morphine as needed. Continue low-dose propranolol. Will follow. Due to patient's continued chest pain described more as a pressure, will consult with cardiology for further evaluation and  rxcs. #2 orthostasis  Likely secondary to GI losses from recent diarrhea in the setting of diuretic therapy. Improving with IV fluids.  We'll monitor follow.  #3 sinus tachycardia  Likely secondary to problem #2. EKG shows a sinus tachycardia. TSH WNL.  Cardiac enzymes neg x3. 2-D echo neg. Continue IVF. Continue Increased propranolol. #4 hypokalemia  Likely secondary to GI losses and the second setting of diuretic therapy. Replete. #5 diarrhea  C. difficile PCR negative.  Imodium as needed.  #6 leukocytosis  Questionable etiology. Likely reactive. Chest x-ray is negative. Urinalysis was negative. Patient is afebrile. C. difficile PCR negative. WBC trending down. No need for antibiotics at this time.  #7. Abdominal pain Will check CT of  abdomen and pelvis. Symptomatic treatment. #8 migraines  Stable. Excedrin as needed. Continue propranolol.  #9 gastroesophageal reflux disease  PPI.  #10 Well controlled diabetes mellitus  Hemoglobin A1c = 5.9. CBG 95. SSI #11 Iron deficiency anemia  Patient with no overt GI bleed. Patient's hemoglobin seems stable since March of 2013. H/H stable.  #11 prophylaxis  PPI for GI prophylaxis. Lovenox for DVT prophylaxis.       LOS: 2 days   Antonette Hendricks [phone] 02/23/2012, 1:21 PM

## 2012-02-23 NOTE — Consult Note (Signed)
CARDIOLOGY CONSULT NOTE  Patient ID: Carla Brooks, MRN: [id number], DOB/AGE: 1971/07/20 41 y.o. Admit date: 02/21/2012 Date of Consult: 02/23/2012  Primary Physician: Dorrene German, MD Primary Cardiologist: Charlton Haws, MD (new)   Reason for Consult: chest pain  HPI: 41 year old female, with no known history of heart disease, now referred to Dr. Eden Emms for evaluation of chest pain. She presents with cardiac risk factors notable for diabetes mellitus, hypertension, and family history. She has not undergone prior ischemic evaluation, but has had a normal echocardiogram during this admission. Serial troponins were within normal limits.  Patient awoke early Friday morning feeling dizzy, nauseous, and diaphoretic. She reported a blood pressure of 157/99, with pulse of 115. She also experienced some chest "tightness", but no dyspnea. She was admitted for further evaluation. Since admission, she has had recurrent episodes of sudden, sharp chest pain radiating to the epigastrium and radiating upward into the neck, and right upper chest region. Symptoms typically last approximately 20 minutes, and are quite intense (9/10). She also reports exacerbation of symptoms if moving from left to right supine position, or by coughing.  Patient presents with no antecedent history of exertional angina pectoris or dyspnea. She is on medication for GERD, but denies any recent active symptoms.   Admission EKG indicated NSR with no ischemic changes.    Past Medical History  Diagnosis Date  . Diabetes mellitus   . Hypertension   . Bronchitis   . Arthritis   . GERD (gastroesophageal reflux disease)   . Asthma   . Migraines 02/21/2012  . Anemia 02/21/2012  . HTN (hypertension) 02/21/2012  . Diabetes mellitus 02/21/2012      Surgical History:  Past Surgical History  Procedure Date  . Cholecystectomy 09/27/2001     Home Meds: Prior to Admission medications   Medication Sig Start Date End Date Taking?  Authorizing Provider  albuterol (PROVENTIL HFA;VENTOLIN HFA) 108 (90 BASE) MCG/ACT inhaler Inhale 2 puffs into the lungs every 6 (six) hours as needed. For wheezing   Yes Historical Provider, MD  aspirin-acetaminophen-caffeine (EXCEDRIN MIGRAINE) [phone] MG per tablet Take 1 tablet by mouth every 6 (six) hours as needed. For migraines   Yes Historical Provider, MD  insulin aspart (NOVOLOG) 100 UNIT/ML injection Inject 4-15 Units into the skin 3 (three) times daily before meals. Sliding scale insulin   Yes Historical Provider, MD  lisinopril-hydrochlorothiazide (PRINZIDE,ZESTORETIC) 10-12.5 MG per tablet Take 1 tablet by mouth daily.   Yes Historical Provider, MD  metFORMIN (GLUCOPHAGE) 1000 MG tablet Take 2,000 mg by mouth at bedtime.   Yes Historical Provider, MD  omeprazole (PRILOSEC) 20 MG capsule Take 20 mg by mouth 2 (two) times daily.   Yes Historical Provider, MD  propranolol (INDERAL) 10 MG tablet Take 10 mg by mouth at bedtime.   Yes Historical Provider, MD    Inpatient Medications:    . aspirin  325 mg Oral Daily  . enoxaparin  40 mg Subcutaneous Q24H  . feeding supplement  237 mL Oral BID BM  . insulin aspart  0-15 Units Subcutaneous TID WC  . nitroGLYCERIN      . pantoprazole  40 mg Oral Q0600  . potassium chloride  40 mEq Oral Once  . propranolol  10 mg Oral BID  . sodium chloride  3 mL Intravenous Q12H    Allergies:  Allergies  Allergen Reactions  . Penicillins Hives    History   Social History  . Marital Status: Single    Spouse Name: N/A  Number of Children: N/A  . Years of Education: N/A   Occupational History  . Not on file.   Social History Main Topics  . Smoking status: Never Smoker   . Smokeless tobacco: Not on file  . Alcohol Use: No  . Drug Use: No  . Sexually Active:    Other Topics Concern  . Not on file   Social History Narrative  . No narrative on file     Family History  Problem Relation Age of Onset  . Coronary artery disease  Father      Review of Systems: General: negative for chills, fever, night sweats or weight changes.  Cardiovascular: negative for chest pain, edema, orthopnea, palpitations, paroxysmal nocturnal dyspnea, shortness of breath or dyspnea on exertion Dermatological: negative for rash Respiratory: negative for cough or wheezing Urologic: negative for hematuria Abdominal: negative for nausea, vomiting, diarrhea, bright red blood per rectum, melena, or hematemesis Neurologic: negative for visual changes, syncope, or dizziness All other systems reviewed and are otherwise negative except as noted above.  Labs:  [facility] 02/22/12 1040 02/22/12 0158 02/21/12 1900 02/21/12 1156  CKTOTAL 114 147 180* 239*  CKMB 0.8 0.9 0.9 0.8  TROPONINI <0.30 <0.30 <0.30 --   Lab Results  Component Value Date   WBC 9.0 02/22/2012   HGB 9.1* 02/22/2012   HCT 28.3* 02/22/2012   MCV 85.5 02/22/2012   PLT 335 02/22/2012    Lab 02/23/12 0656 02/22/12 0158  NA 138 --  K 3.3* --  CL 104 --  CO2 22 --  BUN 5* --  CREATININE 0.85 --  CALCIUM 8.7 --  PROT -- 6.6  BILITOT -- 0.3  ALKPHOS -- 67  ALT -- 9  AST -- 12  GLUCOSE 103* --   No results found for this basename: CHOL, HDL, LDLCALC, TRIG   Lab Results  Component Value Date   DDIMER 0.90* 02/21/2012    Radiology/Studies:  Dg Chest 2 View  02/21/2012  *RADIOLOGY REPORT*  Clinical Data: Chest pain.  CHEST - 2 VIEW  Comparison: 11/15/2011  Findings: The heart size and pulmonary vascularity are normal and the lungs are clear.  No osseous abnormality.  IMPRESSION: Normal chest.  Original Report Authenticated By: Gwynn Burly, M.D.   Ct Angio Chest W/cm &/or Wo Cm  02/21/2012  *RADIOLOGY REPORT*  Clinical Data: Chest pain, shortness of breath.  CT ANGIOGRAPHY CHEST  Technique:  Multidetector CT imaging of the chest using the standard protocol during bolus administration of intravenous contrast. Multiplanar reconstructed images including MIPs were obtained  and reviewed to evaluate the vascular anatomy.  Contrast: 60mL OMNIPAQUE IOHEXOL 350 MG/ML SOLN  Comparison:  02/21/2012 radiograph  Findings: Respiratory motion degrades detailed evaluation of the lower lobe branches.  Allowing for this, no pulmonary arterial branch filling defect.  Normal caliber aorta.  Heart size upper normal limits.  No pleural or pericardial effusion.  No intrathoracic lymphadenopathy.  Limited images through the upper abdomen show no acute abnormality. Status post cholecystectomy.  Respiratory motion degrades detailed evaluation of the lung bases. Mild mosaic attenuation at the bases may reflect atelectasis and/or air trapping.  No focal consolidation. no pneumothorax.  Central airways are patent.  IMPRESSION: Respiratory motion degrades detailed evaluation of the lower lobe branches.  Allowing for this, no pulmonary embolism identified.  Mild mosaic attenuation at the bases may reflect atelectasis and/or air trapping.  Otherwise, no acute intrathoracic process identified.  Original Report Authenticated By: Waneta Martins, M.D.  EKG: NSR 94 bpm; normal axis; no ischemic changes  Physical Exam: Blood pressure 154/92, pulse 74, temperature 99.1 F (37.3 C), temperature source Oral, resp. rate 18, height 5\' 9"  (1.753 m), weight 201 lb 8 oz (91.4 kg), last menstrual period 01/29/2012, SpO2 100.00%. General:  41 year old female, moderately obese, lying supine, in no acute distress. Head: Normocephalic, atraumatic, sclera non-icteric, no xanthomas, nares are without discharge.  Neck: Negative for carotid bruits. JVD not elevated. Lungs: Clear bilaterally to auscultation without wheezes, rales, or rhonchi. Breathing is unlabored. Heart: RRR with S1 S2. No murmurs, rubs, or gallops appreciated. Abdomen: Soft, non-tender, non-distended with normoactive bowel sounds. No hepatomegaly. No rebound/guarding. No obvious abdominal masses. Msk:  Strength and tone appear normal for  age. Extremities: No clubbing or cyanosis. No edema.  Distal pedal pulses are 2+ and equal bilaterally. Neuro: Alert and oriented X 3. Moves all extremities spontaneously. Psych:  Responds to questions appropriately with a normal affect.     Assessment and Plan: 1 Chest pain  - atyical features  - negative cardiac markers/EKGs  - NL 2D echocardiogram  2 multiple CRFs  - DM  - HTN  - FH  3 GERD  PLAN: Recommendation is to proceed with an in-house ischemic evaluation with an exercise stress echocardiogram in the a.m., for risk stratification. If this is negative for ischemia, then no further workup is indicated, and patient can resume regular followup with her primary M.D.   Signed, SERPE, EUGENE PA-C 02/23/2012, 1:52 PM  Patient examined and chart reviewed.  Discussed Rx plan with patient and mother Atypical pain.  Will order stress echo for am.    Charlton Haws 2:32 PM 02/23/2012

## 2012-02-23 NOTE — Evaluation (Signed)
Occupational Therapy Evaluation Patient Details Name: Carla Brooks MRN: [id number] DOB: 05-28-1971 Today's Date: 02/23/2012 Time:  -     OT Assessment / Plan / Recommendation Clinical Impression  Pt demos minimal decline in balance during standing for high level activity. No further OT services required at this time, continue with skilled PT for balance trg    OT Assessment  Patient does not need any further OT services    Follow Up Recommendations  No OT follow up    Barriers to Discharge      Equipment Recommendations  None recommended by OT    Recommendations for Other Services    Frequency       Precautions / Restrictions Precautions Precautions: Fall Precaution Comments: low fall risk, minimally decreased balance for high level activity Restrictions Weight Bearing Restrictions: No    ADL  Eating/Feeding: Performed;Independent Where Assessed - Eating/Feeding: Edge of bed Grooming: Performed;Wash/dry hands;Wash/dry face;Supervision/safety Where Assessed - Grooming: Unsupported standing Upper Body Bathing: Simulated;Chest;Right arm;Left arm;Use of adaptive equipment;Set up Where Assessed - Upper Body Bathing: Unsupported sitting Lower Body Bathing: Simulated;Supervision/safety;Set up Where Assessed - Lower Body Bathing: Unsupported sitting;Supported sit to stand Upper Body Dressing: Performed;Set up Where Assessed - Upper Body Dressing: Unsupported sitting Lower Body Dressing: Performed;Other (comment);Set up (donning shoes) Where Assessed - Lower Body Dressing: Sopported sit to stand Toilet Transfer: Performed;Supervision/safety Toilet Transfer Method: Sit to stand;Stand pivot Toilet Transfer Equipment: Regular height toilet Toileting - Clothing Manipulation and Hygiene: Performed;Supervision/safety Where Assessed - Glass blower/designer Manipulation and Hygiene: Standing Equipment Used: Gait belt Transfers/Ambulation Related to ADLs: Pt holding onto furniture and  rails when standing/ambulating       Visit Information  Last OT Received On: 02/23/12    Subjective Data  Subjective: " I wish they would figure out what is wrong " Patient Stated Goal: To return home with family assist   Prior Functioning  Home Living Lives With: Son;Daughter Available Help at Discharge: Family Type of Home: House Home Access: Stairs to enter Secretary/administrator of Steps: 3 Entrance Stairs-Rails: None Home Layout: One level Bathroom Shower/Tub: Engineer, manufacturing systems: Standard Bathroom Accessibility: Yes Home Adaptive Equipment: None Prior Function Level of Independence: Independent Able to Take Stairs?: Yes Driving: No (mother and uncle provide transportation prn) Vocation: On disability Communication Communication: No difficulties Dominant Hand: Right    Cognition  Overall Cognitive Status: Appears within functional limits for tasks assessed/performed Arousal/Alertness: Awake/alert Orientation Level: Oriented X4 / Intact Behavior During Session: [facility] for tasks performed    Extremity/Trunk Assessment     Mobility Bed Mobility Bed Mobility: Supine to Sit;Sitting - Scoot to Edge of Bed Supine to Sit: 6: Modified independent (Device/Increase time) Sitting - Scoot to Edge of Bed: 7: Independent Transfers Transfers: Sit to Stand;Stand to Sit Sit to Stand: 5: Supervision Stand to Sit: 5: Supervision   Exercise    Balance    End of Session OT - End of Session Equipment Utilized During Treatment: Gait belt Activity Tolerance: Patient tolerated treatment well Patient left: in chair;with call bell/phone within reach   Galen Manila 02/23/2012, 1:01 PM

## 2012-02-23 NOTE — Progress Notes (Signed)
Pt c/o chest pain radiating towards to shoulders & neck areas. 12 EKG done stat -showed normal EKG. Given 1 SL ntg bp 164/94 NSR 94 rr 19 Pt stated pain "5" out of scale of 1 to 10. Relieved with 1 NTG SL. Text paged DR Isidoro Donning

## 2012-02-23 NOTE — Progress Notes (Signed)
Pt noted vomiting x2 during the shift, noted dark brown in color. Complained of having upper chest pain creating shortness of breath and vomited at 0130, Mylanta given and noted effective. Pt then complained of having upper chest pain that radiated around front of chest and up the back of her neck at 0500, Morphine given and noted effective. Pt's mother Ilya Ess) called and voiced concern about pt having periods of being fine then having issues with her stomach that have progressively been getting worse over the past 3 months.

## 2012-02-24 DIAGNOSIS — R079 Chest pain, unspecified: Secondary | ICD-10-CM

## 2012-02-24 DIAGNOSIS — R072 Precordial pain: Secondary | ICD-10-CM

## 2012-02-24 DIAGNOSIS — E876 Hypokalemia: Secondary | ICD-10-CM

## 2012-02-24 DIAGNOSIS — R197 Diarrhea, unspecified: Secondary | ICD-10-CM

## 2012-02-24 DIAGNOSIS — I951 Orthostatic hypotension: Secondary | ICD-10-CM

## 2012-02-24 LAB — CBC
Hemoglobin: 9.2 g/dL — ABNORMAL LOW (ref 12.0–15.0)
MCH: 28.1 pg (ref 26.0–34.0)
MCHC: 33.2 g/dL (ref 30.0–36.0)
Platelets: 340 10*3/uL (ref 150–400)
RDW: 14.8 % (ref 11.5–15.5)

## 2012-02-24 LAB — BASIC METABOLIC PANEL
BUN: 6 mg/dL (ref 6–23)
Calcium: 9.3 mg/dL (ref 8.4–10.5)
Creatinine, Ser: 0.86 mg/dL (ref 0.50–1.10)
GFR calc non Af Amer: 84 mL/min — ABNORMAL LOW (ref >90)
Glucose, Bld: 97 mg/dL (ref 70–99)

## 2012-02-24 LAB — GLUCOSE, CAPILLARY
Glucose-Capillary: 100 mg/dL — ABNORMAL HIGH (ref 70–99)
Glucose-Capillary: 122 mg/dL — ABNORMAL HIGH (ref 70–99)

## 2012-02-24 MED ORDER — LISINOPRIL 20 MG PO TABS: 20.0000 mg | ORAL_TABLET | Freq: Every day | ORAL | Status: DC

## 2012-02-24 MED ORDER — PROPRANOLOL HCL 10 MG PO TABS: 10.0000 mg | ORAL_TABLET | Freq: Two times a day (BID) | ORAL | Status: DC

## 2012-02-24 MED ORDER — PANTOPRAZOLE SODIUM 40 MG PO TBEC: 40.0000 mg | DELAYED_RELEASE_TABLET | Freq: Every day | ORAL | Status: DC

## 2012-02-24 MED ORDER — LISINOPRIL 10 MG PO TABS
10.0000 mg | ORAL_TABLET | Freq: Every day | ORAL | Status: DC
  Administered 2012-02-24: 10 mg via ORAL
  Filled 2012-02-24: qty 1

## 2012-02-24 NOTE — Progress Notes (Signed)
  Stress echocardiogram has been completed.   Carla Brooks 02/24/2012, 10:50 AM

## 2012-02-24 NOTE — Progress Notes (Signed)
Utilization review completed.  

## 2012-02-24 NOTE — Discharge Summary (Signed)
Discharge Summary  Carla Brooks MR#: [id number]  DOB:[birth year]  Date of Admission: 02/21/2012 Date of Discharge: 02/24/2012  Patient's PCP: Dorrene German, MD  Attending Physician:Vonya Ohalloran  Consults: Treatment Team: #1: Cardiology Wendall Stade, MD   Discharge Diagnoses: Chest pain Present on Admission:  .Chest pain .Orthostasis .Tachycardia .Hypokalemia .Leukocytosis .Diarrhea .Migraines .Anemia .GERD (gastroesophageal reflux disease) .HTN (hypertension) .Diabetes mellitus   Brief Admitting History and Physical  Carla Brooks is a 41 year old African American female with a history of hypertension, type 2 diabetes since 2010, family history of coronary artery disease/cardiac etiology as patient's father had a pacemaker placement defibrillator placed at age 9 secondary to syncope. Patient also with a history of gastroesophageal reflux disease, migraine headaches who presented to the ED with right-sided chest pain. Patient's symptoms occurred when he awoke on the morning of admission around 4 AM. Patient describes his chest pain as if someone was sitting on his chest and lasted approximately 2 hours after which it had been lingering. Patient denies chest pain radiates. Patient states that laying down flat or sideways improvement in his chest pain and standing up worsened assessment. Patient endorses shortness of breath, dizziness, diaphoresis, nausea, and diarrhea. Patient denies any fevers, no chills, no abdominal pain, no constipation no recent antibiotic use. Patient subsequently called EMS and was brought to the ED. Patient was given some aspirin as well as nitroglycerin on route to resolution of her shortness of breath and significant improvement in her chest pain. Patient states has been chest pain-free since in the ED. Labs obtained did show an EKG with sinus tachycardia, urinalysis which was done was negative, CT angiogram done of the chest was negative for PE.  BMET done in the ED showed a potassium of 3.2 otherwise was within normal limits. First set of cardiac enzymes were negative. CBC with a white count of 15.5 hemoglobin of 9.8 and a platelet count of 366. Will call to admit the patient for further evaluation and management For the rest of admission history and physical please see H&P dictated by Dr. Janee Morn. Discharge Medications Medication List  As of 02/24/2012  7:55 PM   START taking these medications         lisinopril 20 MG tablet   Commonly known as: PRINIVIL,ZESTRIL   Take 1 tablet (20 mg total) by mouth daily.      pantoprazole 40 MG tablet   Commonly known as: PROTONIX   Take 1 tablet (40 mg total) by mouth daily at 6 (six) AM.         CHANGE how you take these medications         propranolol 10 MG tablet   Commonly known as: INDERAL   Take 1 tablet (10 mg total) by mouth 2 (two) times daily.   What changed: how often to take the med         CONTINUE taking these medications         albuterol 108 (90 BASE) MCG/ACT inhaler   Commonly known as: PROVENTIL HFA;VENTOLIN HFA      aspirin-acetaminophen-caffeine 250-250-65 MG per tablet   Commonly known as: EXCEDRIN MIGRAINE      insulin aspart 100 UNIT/ML injection   Commonly known as: novoLOG      metFORMIN 1000 MG tablet   Commonly known as: GLUCOPHAGE         STOP taking these medications         lisinopril-hydrochlorothiazide 10-12.5 MG per tablet      omeprazole 20 MG  capsule          Where to get your medications    These are the prescriptions that you need to pick up.   You may get these medications from any pharmacy.         lisinopril 20 MG tablet   pantoprazole 40 MG tablet   propranolol 10 MG tablet           Hospital Course: #1: Chest pain Patient was admitted with both typical and atypical features features of chest pain. Due to patient's history of hypertension, diabetes and family history of coronary artery disease there was a concern  for cardiac etiology. Cardiac enzymes were cycled which were negative x3. A 2-D echo was also obtained which showed an EF of 55-60% with no wall motion abnormalities. Patient was also placed on a PPI. Patient continued to have chest pain which he described as a pressure and a such a cardiology consultation was obtained. Patient was seen in consultation by Dr. Eden Emms on 02/23/2012 who felt that patient will benefit from a stress echo. Patient had a stress echo done on 02/24/2012 which was negative for any inducible ischemia. Patient's chest pain had resolved on the day of discharge she was given in stable and improved condition. Patient be discharged in stable and improved condition to followup with PCP one week post discharge. Will followup with PCP will recommend a GI referral for further evaluation.  #2 orthostasis On admission patient was noted to be orthostatic. It was felt this was likely secondary to volume depletion in the setting of diuretic. Patient's HCTZ was discontinued she was hydrated with IV fluids with clinical improvement. By day of discharge patient is orthostasis had resolved. Patient be discharged home off of the HCTZ in stable and improved condition to followup with her PCP as outpatient.  #3 hypertension On admission patient was noted to be orthostatic and a such a blood pressure medications were held. Patient was hydrated with IV fluids and became euvolemic. Patient's HCTZ was discontinued. Patient was maintained on lisinopril and the dose increased to 20 mg daily. Patient's propranolol was also increased to 10 mg twice daily. Patient's blood pressure improved on this regimen she'll be discharged home on this regimen. Patient will followup with PCP one week post discharge for further evaluation and management. On followup will recommend a basic metabolic profile to follow the patient's electrolytes and renal function.  #4 diarrhea During the hospitalization patient had presented with  complaints of diarrhea. C. difficile PCR was checked which came back negative. Patient was placed on Imodium on an as-needed basis. Patient's diarrhea had resolved by day of discharge. Patient continues to have diarrhea as outpatient may consider discontinuing her metformin.  The rest of patient's chronic medical issues remained stable throughout the hospitalization.   Present on Admission:  .Chest pain .Orthostasis .Tachycardia .Hypokalemia .Leukocytosis .Diarrhea .Migraines .Anemia .GERD (gastroesophageal reflux disease) .HTN (hypertension) .Diabetes mellitus   Day of Discharge BP 114/78  Pulse 84  Temp 98 F (36.7 C) (Oral)  Resp 17  Ht 5\' 9"  (1.753 m)  Wt 89.9 kg (198 lb 3.1 oz)  BMI 29.27 kg/m2  SpO2 98%  LMP 01/29/2012 Subjective: No complaints. Patient denies any chest pain. No diarrhea. Patient states he feels great. General: Alert, awake, oriented x3, in no acute distress. HEENT: No bruits, no goiter. Heart: Regular rate and rhythm, without murmurs, rubs, gallops. Lungs: Clear to auscultation bilaterally. Abdomen: Soft, nontender, nondistended, positive bowel sounds. Extremities: No clubbing cyanosis or  edema with positive pedal pulses.   Results for orders placed during the hospital encounter of 02/21/12 (from the past 48 hour(s))  GLUCOSE, CAPILLARY     Status: Normal   Collection Time   02/22/12  9:40 PM      Component Value Range Comment   Glucose-Capillary 91  70 - 99 mg/dL    Comment 1 Notify RN     BASIC METABOLIC PANEL     Status: Abnormal   Collection Time   02/23/12  6:56 AM      Component Value Range Comment   Sodium 138  135 - 145 mEq/L    Potassium 3.3 (*) 3.5 - 5.1 mEq/L    Chloride 104  96 - 112 mEq/L    CO2 22  19 - 32 mEq/L    Glucose, Bld 103 (*) 70 - 99 mg/dL    BUN 5 (*) 6 - 23 mg/dL    Creatinine, Ser 7.82  0.50 - 1.10 mg/dL    Calcium 8.7  8.4 - 95.6 mg/dL    GFR calc non Af Amer 85 (*) >90 mL/min    GFR calc Af Amer >90  >90  mL/min   GLUCOSE, CAPILLARY     Status: Normal   Collection Time   02/23/12  7:03 AM      Component Value Range Comment   Glucose-Capillary 95  70 - 99 mg/dL   GLUCOSE, CAPILLARY     Status: Normal   Collection Time   02/23/12 10:51 AM      Component Value Range Comment   Glucose-Capillary 95  70 - 99 mg/dL   GLUCOSE, CAPILLARY     Status: Abnormal   Collection Time   02/23/12  3:48 PM      Component Value Range Comment   Glucose-Capillary 116 (*) 70 - 99 mg/dL   GLUCOSE, CAPILLARY     Status: Abnormal   Collection Time   02/23/12 10:35 PM      Component Value Range Comment   Glucose-Capillary 129 (*) 70 - 99 mg/dL    Comment 1 Notify RN     BASIC METABOLIC PANEL     Status: Abnormal   Collection Time   02/24/12  5:15 AM      Component Value Range Comment   Sodium 141  135 - 145 mEq/L    Potassium 3.7  3.5 - 5.1 mEq/L    Chloride 104  96 - 112 mEq/L    CO2 27  19 - 32 mEq/L    Glucose, Bld 97  70 - 99 mg/dL    BUN 6  6 - 23 mg/dL    Creatinine, Ser 2.13  0.50 - 1.10 mg/dL    Calcium 9.3  8.4 - 08.6 mg/dL    GFR calc non Af Amer 84 (*) >90 mL/min    GFR calc Af Amer >90  >90 mL/min   CBC     Status: Abnormal   Collection Time   02/24/12  5:15 AM      Component Value Range Comment   WBC 7.2  4.0 - 10.5 K/uL    RBC 3.27 (*) 3.87 - 5.11 MIL/uL    Hemoglobin 9.2 (*) 12.0 - 15.0 g/dL    HCT 57.8 (*) 46.9 - 46.0 %    MCV 84.7  78.0 - 100.0 fL    MCH 28.1  26.0 - 34.0 pg    MCHC 33.2  30.0 - 36.0 g/dL    RDW 62.9  52.8 -  15.5 %    Platelets 340  150 - 400 K/uL   GLUCOSE, CAPILLARY     Status: Abnormal   Collection Time   02/24/12  6:14 AM      Component Value Range Comment   Glucose-Capillary 100 (*) 70 - 99 mg/dL    Comment 1 Notify RN     GLUCOSE, CAPILLARY     Status: Normal   Collection Time   02/24/12 11:13 AM      Component Value Range Comment   Glucose-Capillary 99  70 - 99 mg/dL   GLUCOSE, CAPILLARY     Status: Abnormal   Collection Time   02/24/12  4:10 PM       Component Value Range Comment   Glucose-Capillary 122 (*) 70 - 99 mg/dL    Comment 1 Notify RN       Dg Chest 2 View  02/21/2012  *RADIOLOGY REPORT*  Clinical Data: Chest pain.  CHEST - 2 VIEW  Comparison: 11/15/2011  Findings: The heart size and pulmonary vascularity are normal and the lungs are clear.  No osseous abnormality.  IMPRESSION: Normal chest.  Original Report Authenticated By: Gwynn Burly, M.D.   Ct Angio Chest W/cm &/or Wo Cm  02/21/2012  *RADIOLOGY REPORT*  Clinical Data: Chest pain, shortness of breath.  CT ANGIOGRAPHY CHEST  Technique:  Multidetector CT imaging of the chest using the standard protocol during bolus administration of intravenous contrast. Multiplanar reconstructed images including MIPs were obtained and reviewed to evaluate the vascular anatomy.  Contrast: 60mL OMNIPAQUE IOHEXOL 350 MG/ML SOLN  Comparison:  02/21/2012 radiograph  Findings: Respiratory motion degrades detailed evaluation of the lower lobe branches.  Allowing for this, no pulmonary arterial branch filling defect.  Normal caliber aorta.  Heart size upper normal limits.  No pleural or pericardial effusion.  No intrathoracic lymphadenopathy.  Limited images through the upper abdomen show no acute abnormality. Status post cholecystectomy.  Respiratory motion degrades detailed evaluation of the lung bases. Mild mosaic attenuation at the bases may reflect atelectasis and/or air trapping.  No focal consolidation. no pneumothorax.  Central airways are patent.  IMPRESSION: Respiratory motion degrades detailed evaluation of the lower lobe branches.  Allowing for this, no pulmonary embolism identified.  Mild mosaic attenuation at the bases may reflect atelectasis and/or air trapping.  Otherwise, no acute intrathoracic process identified.  Original Report Authenticated By: Waneta Martins, M.D.   Ct Abdomen Pelvis W Contrast  02/23/2012  *RADIOLOGY REPORT*  Clinical Data: 41 year old female with lower abdominal  pain radiating to the chest nausea, vomiting, elevated blood pressure, diabetes.  CT ABDOMEN AND PELVIS WITH CONTRAST  Technique:  Multidetector CT imaging of the abdomen and pelvis was performed following the standard protocol during bolus administration of intravenous contrast.  Contrast: OMNIPAQUE IOHEXOL 300 MG/ML  SOLN  Comparison: Chest CTA 02/21/2012.  Findings: Trace bilateral pleural effusions are new.  Bilateral confluent opacity in the costophrenic sulci, with linear opacity extending to the left hilum on the left.  No pericardial effusion. No acute osseous abnormality identified.  No definite pelvic free fluid.  The uterus is enlarged with multiple heterogeneous density masses, mostly in the lower uterine segment.  A large mass on the left measures least 45 x 47 mm (series 2 image 70).  Probable cystic areas in the right adnexa. The left adnexa is not well delineated.  The sigmoid colon is redundant extending partially toward the right upper quadrant, but otherwise within normal limits.  Negative left colon.  Negative transverse colon and hepatic flexure.  Oral contrast has chest reached the terminal ileum and not yet reached the ileocecal valve.  The appendix appears normal containing gas. No pericecal inflammation.  Small mesenteric lymph nodes are within normal limits by size criteria, may be mildly increased in number. No dilated small bowel.  The gallbladder is surgically absent.  Decompressed stomach and duodenum.  Negative liver, spleen, pancreas, adrenal glands, and portal venous system.  Both kidneys are within normal limits.  No nephrolithiasis or evidence of obstruction.  Suboptimal intraarterial contrast bolus, major arterial structures appear grossly patent.  No abdominal free fluid.  IMPRESSION: 1.  Trace bilateral pleural effusions and bilateral costophrenic angle pulmonary opacity is new since 02/21/2012, the latter may simply represent atelectasis. 2.  Fibroid uterus.  Probable  physiologic right adnexal cyst. 3.  No focal inflammatory changes in the abdomen.  Appendix appears within normal limits.  Original Report Authenticated By: Harley Hallmark, M.D.     Disposition: Home  Diet: Carb modified diet  Activity: Increase activity slowly   Follow-up Appts: Discharge Orders    Future Orders Please Complete By Expires   Diet Carb Modified      Increase activity slowly      Discharge instructions      Comments:   Follow up with AVBUERE,EDWIN A, MD in 1 week.      TESTS THAT NEED FOLLOW-UP BMET in 1 week on follow up.  Time spent on discharge, talking to the patient, and coordinating care: 60 mins.   SignedRamiro Harvest 02/24/2012, 7:55 PM

## 2012-02-24 NOTE — Progress Notes (Signed)
Patient ID: Carla Brooks, female   DOB: 1971/05/07, 41 y.o.   MRN: [id number]    Subjective:  Atypical chest pains   Objective:  Filed Vitals:   02/23/12 1100 02/23/12 1342 02/23/12 2138 02/24/12 0558  BP: 160/92 154/92 147/90 146/90  Pulse: 92 74 84 77  Temp:  99.1 F (37.3 C) 97.1 F (36.2 C) 98.7 F (37.1 C)  TempSrc:  Oral Oral Oral  Resp:  18 16 16   Height:      Weight:    89.9 kg (198 lb 3.1 oz)  SpO2:  100% 98% 97%    Intake/Output from previous day:  Intake/Output Summary (Last 24 hours) at 02/24/12 0834 Last data filed at 02/23/12 2130  Gross per 24 hour  Intake    480 ml  Output      0 ml  Net    480 ml    Physical Exam: Affect appropriate Overweight black female HEENT: normal Neck supple with no adenopathy JVP normal no bruits no thyromegaly Lungs clear with no wheezing and good diaphragmatic motion Heart:  S1/S2 no murmur, no rub, gallop or click PMI normal Abdomen: benighn, BS positve, no tenderness, no AAA no bruit.  No HSM or HJR Distal pulses intact with no bruits No edema Neuro non-focal Skin warm and dry No muscular weakness   Lab Results: Basic Metabolic Panel:  Basename 02/24/12 0515 02/23/12 0656 02/21/12 1900  NA 141 138 --  K 3.7 3.3* --  CL 104 104 --  CO2 27 22 --  GLUCOSE 97 103* --  BUN 6 5* --  CREATININE 0.86 0.85 --  CALCIUM 9.3 8.7 --  MG -- -- 1.7  PHOS -- -- --   Liver Function Tests:  Basename 02/22/12 0158  AST 12  ALT 9  ALKPHOS 67  BILITOT 0.3  PROT 6.6  ALBUMIN 2.9*   No results found for this basename: LIPASE:2,AMYLASE:2 in the last 72 hours CBC:  Basename 02/24/12 0515 02/22/12 0158  WBC 7.2 9.0  NEUTROABS -- --  HGB 9.2* 9.1*  HCT 27.7* 28.3*  MCV 84.7 85.5  PLT 340 335   Cardiac Enzymes:  Basename 02/22/12 1040 02/22/12 0158 02/21/12 1900  CKTOTAL 114 147 180*  CKMB 0.8 0.9 0.9  CKMBINDEX -- -- --  TROPONINI <0.30 <0.30 <0.30   BNP: No components found with this basename:  POCBNP:3 D-Dimer:  Basename 02/21/12 1155  DDIMER 0.90*   Hemoglobin A1C:  Basename 02/21/12 1755  HGBA1C 5.9*   Fasting Lipid Panel: No results found for this basename: CHOL,HDL,LDLCALC,TRIG,CHOLHDL,LDLDIRECT in the last 72 hours Thyroid Function Tests:  Basename 02/21/12 1900  TSH 0.469  T4TOTAL --  T3FREE --  THYROIDAB --   Anemia Panel:  Basename 02/21/12 1900  VITAMINB12 480  FOLATE 14.5  FERRITIN 54  TIBC 412  IRON 17*  RETICCTPCT --    Imaging: Ct Abdomen Pelvis W Contrast  02/23/2012  *RADIOLOGY REPORT*  Clinical Data: 41 year old female with lower abdominal pain radiating to the chest nausea, vomiting, elevated blood pressure, diabetes.  CT ABDOMEN AND PELVIS WITH CONTRAST  Technique:  Multidetector CT imaging of the abdomen and pelvis was performed following the standard protocol during bolus administration of intravenous contrast.  Contrast: OMNIPAQUE IOHEXOL 300 MG/ML  SOLN  Comparison: Chest CTA 02/21/2012.  Findings: Trace bilateral pleural effusions are new.  Bilateral confluent opacity in the costophrenic sulci, with linear opacity extending to the left hilum on the left.  No pericardial effusion. No acute osseous abnormality  identified.  No definite pelvic free fluid.  The uterus is enlarged with multiple heterogeneous density masses, mostly in the lower uterine segment.  A large mass on the left measures least 45 x 47 mm (series 2 image 70).  Probable cystic areas in the right adnexa. The left adnexa is not well delineated.  The sigmoid colon is redundant extending partially toward the right upper quadrant, but otherwise within normal limits.  Negative left colon.  Negative transverse colon and hepatic flexure.  Oral contrast has chest reached the terminal ileum and not yet reached the ileocecal valve.  The appendix appears normal containing gas. No pericecal inflammation.  Small mesenteric lymph nodes are within normal limits by size criteria, may be mildly  increased in number. No dilated small bowel.  The gallbladder is surgically absent.  Decompressed stomach and duodenum.  Negative liver, spleen, pancreas, adrenal glands, and portal venous system.  Both kidneys are within normal limits.  No nephrolithiasis or evidence of obstruction.  Suboptimal intraarterial contrast bolus, major arterial structures appear grossly patent.  No abdominal free fluid.  IMPRESSION: 1.  Trace bilateral pleural effusions and bilateral costophrenic angle pulmonary opacity is new since 02/21/2012, the latter may simply represent atelectasis. 2.  Fibroid uterus.  Probable physiologic right adnexal cyst. 3.  No focal inflammatory changes in the abdomen.  Appendix appears within normal limits.  Original Report Authenticated By: Harley Hallmark, M.D.    Cardiac Studies:  ECG: 6/23  NSR rate 94 normal ECG   Telemetry: NSR no VT or afib 02/24/2012   Echo: 6/22 - Left ventricle: The cavity size was normal. Wall thickness was normal. Systolic function was normal. The estimated ejection fraction was in the range of 55% to 60%. Wall motion was normal; there were no regional wall motion abnormalities. Left ventricular diastolic function parameters were normal. - Atrial septum: No defect or patent foramen ovale was identified.    Medications:     . aspirin  325 mg Oral Daily  . enoxaparin  40 mg Subcutaneous Q24H  . feeding supplement  237 mL Oral BID BM  . insulin aspart  0-15 Units Subcutaneous TID WC  . iohexol  20 mL Oral Q1 Hr x 2  . lisinopril  10 mg Oral Daily  . nitroGLYCERIN      . pantoprazole  40 mg Oral Q0600  . potassium chloride  40 mEq Oral Once  . propranolol  10 mg Oral BID  . sodium chloride  3 mL Intravenous Q12H       Assessment/Plan:  Chest Pain:  Atypical stress echo today D/C home if normal HTN:  Consider increasing ACE to 20mg   Charlton Haws 02/24/2012, 8:34 AM

## 2012-02-24 NOTE — Progress Notes (Signed)
Physical Therapy Treatment Patient Details Name: Carla Brooks MRN: [id number] DOB: 12/30/1970 Today's Date: 02/24/2012 Time: 2956-2130 PT Time Calculation (min): 21 min  PT Assessment / Plan / Recommendation Comments on Treatment Session  Pt demonstrates safety and independence with all mobility.     Follow Up Recommendations  No PT follow up;Supervision/Assistance - 24 hour    Barriers to Discharge        Equipment Recommendations  None recommended by PT    Recommendations for Other Services    Frequency     Plan All goals met and education completed, patient dischaged from PT services    Precautions / Restrictions Precautions Precautions: Fall Precaution Comments: low fall risk, minimally decreased balance for high level activity Restrictions Weight Bearing Restrictions: No   Pertinent Vitals/Pain No pain    Mobility  Bed Mobility Bed Mobility: Supine to Sit;Sit to Supine;Sitting - Scoot to Edge of Bed Supine to Sit: 7: Independent;HOB flat Sitting - Scoot to Edge of Bed: 7: Independent Sit to Supine: 7: Independent;HOB flat Details for Bed Mobility Assistance: No cues needed. Transfers Transfers: Sit to Stand;Stand to Sit Sit to Stand: 7: Independent;From chair/3-in-1;From bed;With upper extremity assist Stand to Sit: 7: Independent;To bed;To chair/3-in-1;With armrests Details for Transfer Assistance: No cues required.  Ambulation/Gait Ambulation/Gait Assistance: 7: Independent Ambulation Distance (Feet): 200 Feet Assistive device: None Ambulation/Gait Assistance Details: No difficulty, no assistance/cueing required Gait Pattern: Within Functional Limits Stairs: Yes Stairs Assistance: 5: Supervision Stairs Assistance Details (indicate cue type and reason): A little unsteady first trial. Demonstrates safety and independence second trial Stair Management Technique: Forwards;No rails Number of Stairs: 4  Wheelchair Mobility Wheelchair Mobility: No      Exercises     PT Diagnosis:    PT Problem List:   PT Treatment Interventions:     PT Goals Acute Rehab PT Goals PT Goal Formulation: With patient Time For Goal Achievement: 02/29/12 Potential to Achieve Goals: Good Pt will go Sit to Stand: Independently PT Goal: Sit to Stand - Progress: Met Pt will go Stand to Sit: Independently PT Goal: Stand to Sit - Progress: Met Pt will Ambulate: >150 feet;Independently PT Goal: Ambulate - Progress: Met Pt will Go Up / Down Stairs: 3-5 stairs;with supervision PT Goal: Up/Down Stairs - Progress: Met Additional Goals Additional Goal #1: Patient will score > 20 on DGI to indicate low fall risk. PT Goal: Additional Goal #1 - Progress: Met  Visit Information  Last PT Received On: 02/24/12 Assistance Needed: +1    Subjective Data  Subjective: I can go home if my stress test is normal.  Patient Stated Goal: To return home   Cognition  Overall Cognitive Status: Appears within functional limits for tasks assessed/performed Arousal/Alertness: Awake/alert Orientation Level: Oriented X4 / Intact Behavior During Session: [facility] for tasks performed    Balance  Balance Balance Assessed: Yes Standardized Balance Assessment Standardized Balance Assessment: Dynamic Gait Index Dynamic Gait Index Level Surface: Normal Change in Gait Speed: Normal Gait with Horizontal Head Turns: Normal Gait with Vertical Head Turns: Normal Gait and Pivot Turn: Normal Step Over Obstacle: Normal Step Around Obstacles: Normal Steps: Normal Total Score: 24  High Level Balance High Level Balance Activites: Direction changes;Turns;Head turns High Level Balance Comments: no balance deficits noted.   End of Session PT - End of Session Equipment Utilized During Treatment: Gait belt Activity Tolerance: Patient tolerated treatment well Patient left: in chair;with call bell/phone within reach Nurse Communication: Mobility status    Kalif Kattner 02/24/2012, 4:45  PM Caedin Mogan L. Shyne Resch DPT [phone]

## 2012-03-02 ENCOUNTER — Emergency Department (HOSPITAL_COMMUNITY)
Admission: EM | Admit: 2012-03-02 | Discharge: 2012-03-02 | Disposition: A | Discharge status: Home Health | Payer: Medicaid Other | Attending: Emergency Medicine | Admitting: Emergency Medicine

## 2012-03-02 DIAGNOSIS — X58XXXA Exposure to other specified factors, initial encounter: Secondary | ICD-10-CM | POA: Insufficient documentation

## 2012-03-02 DIAGNOSIS — K219 Gastro-esophageal reflux disease without esophagitis: Secondary | ICD-10-CM | POA: Insufficient documentation

## 2012-03-02 DIAGNOSIS — I10 Essential (primary) hypertension: Secondary | ICD-10-CM | POA: Insufficient documentation

## 2012-03-02 DIAGNOSIS — Z79899 Other long term (current) drug therapy: Secondary | ICD-10-CM | POA: Insufficient documentation

## 2012-03-02 DIAGNOSIS — Z8739 Personal history of other diseases of the musculoskeletal system and connective tissue: Secondary | ICD-10-CM | POA: Insufficient documentation

## 2012-03-02 DIAGNOSIS — H5789 Other specified disorders of eye and adnexa: Secondary | ICD-10-CM | POA: Insufficient documentation

## 2012-03-02 DIAGNOSIS — E119 Type 2 diabetes mellitus without complications: Secondary | ICD-10-CM | POA: Insufficient documentation

## 2012-03-02 DIAGNOSIS — T7840XA Allergy, unspecified, initial encounter: Secondary | ICD-10-CM | POA: Insufficient documentation

## 2012-03-02 DIAGNOSIS — Z794 Long term (current) use of insulin: Secondary | ICD-10-CM | POA: Insufficient documentation

## 2012-03-02 MED ORDER — DIPHENHYDRAMINE HCL 50 MG/ML IJ SOLN
50.0000 mg | Freq: Once | INTRAMUSCULAR | Status: AC
  Administered 2012-03-02: 50 mg via INTRAVENOUS
  Filled 2012-03-02: qty 1

## 2012-03-02 MED ORDER — METHYLPREDNISOLONE SODIUM SUCC 125 MG IJ SOLR
125.0000 mg | Freq: Once | INTRAMUSCULAR | Status: AC
  Administered 2012-03-02: 125 mg via INTRAVENOUS
  Filled 2012-03-02: qty 2

## 2012-03-02 MED ORDER — SODIUM CHLORIDE 0.9 % IV SOLN
INTRAVENOUS | Status: DC
  Administered 2012-03-02: 16:00:00 via INTRAVENOUS

## 2012-03-02 MED ORDER — ACETAMINOPHEN 325 MG PO TABS
650.0000 mg | ORAL_TABLET | Freq: Once | ORAL | Status: AC
  Administered 2012-03-02: 650 mg via ORAL
  Filled 2012-03-02: qty 2

## 2012-03-02 MED ORDER — FAMOTIDINE IN NACL 20-0.9 MG/50ML-% IV SOLN
20.0000 mg | Freq: Once | INTRAVENOUS | Status: AC
  Administered 2012-03-02: 20 mg via INTRAVENOUS
  Filled 2012-03-02: qty 50

## 2012-03-02 MED ORDER — EPINEPHRINE 0.3 MG/0.3ML IJ DEVI: 0.3000 mg | INTRAMUSCULAR | Status: AC | PRN

## 2012-03-02 NOTE — Discharge Instructions (Signed)
In event of severe allergic reaction in future (i.e. Throat closing off, trouble breathing, about to faint/pass out), use epipen as directed, take benadryl 25-50 mg, and seek medical attention right away.  Given your allergies and current allergic reaction, follow up with allergist in coming week - call office tomorrow morning to arrange that appointment.  Avoid any foods that you think you may be potentially allergic to.   Your blood pressure is high today, follow up with primary care doctor in coming week.   Return to ER right away if worse, symptoms recur, trouble breathing, other concern.     Allergic Reaction Allergic reactions can be caused by anything your body is sensitive to. Your body may be sensitive to food, medicines, molds, pollens, cockroaches, dust mites, pets, insect stings, and other things around you. An allergic reaction may cause puffiness (swelling), itching, sneezing, coughing, or problems breathing.  Allergies cannot be cured, but they can be controlled with medicine. Some allergies happen only at certain times of the year. Try to stay away from what causes your reaction if possible. Sometimes, it is hard to tell what causes your reaction. HOME CARE If you have a rash or red patches (hives) on your skin:  Take medicines as told by your doctor.   Do not drive or drink alcohol after taking medicines. They can make you sleepy.   Put cold cloths on your skin. Take baths in cool water. This will help your itching. Do not take hot baths or showers. Heat will make the itching worse.   If your allergies get worse, your doctor might give you other medicines. Talk to your doctor if problems continue.  GET HELP RIGHT AWAY IF:   You have trouble breathing.   You have a tight feeling in your chest or throat.   Your mouth gets puffy (swollen).   You have red, itchy patches on your skin (hives) that get worse.   You have itching all over your body.  MAKE SURE YOU:    Understand these instructions.   Will watch your condition.   Will get help right away if you are not doing well or get worse.  Document Released: 08/07/2009 Document Revised: 08/08/2011 Document Reviewed: 08/07/2009 [facility] Patient Information 2012 [place], [place].      Hypertension As your heart beats, it forces blood through your arteries. This force is your blood pressure. If the pressure is too high, it is called hypertension (HTN) or high blood pressure. HTN is dangerous because you may have it and not know it. High blood pressure may mean that your heart has to work harder to pump blood. Your arteries may be narrow or stiff. The extra work puts you at risk for heart disease, stroke, and other problems.  Blood pressure consists of two numbers, a higher number over a lower, 110/72, for example. It is stated as "110 over 72." The ideal is below 120 for the top number (systolic) and under 80 for the bottom (diastolic). Write down your blood pressure today. You should pay close attention to your blood pressure if you have certain conditions such as:  Heart failure.   Prior heart attack.   Diabetes   Chronic kidney disease.   Prior stroke.   Multiple risk factors for heart disease.  To see if you have HTN, your blood pressure should be measured while you are seated with your arm held at the level of the heart. It should be measured at least twice. A one-time elevated  blood pressure reading (especially in the Emergency Department) does not mean that you need treatment. There may be conditions in which the blood pressure is different between your right and left arms. It is important to see your caregiver soon for a recheck. Most people have essential hypertension which means that there is not a specific cause. This type of high blood pressure may be lowered by changing lifestyle factors such as:  Stress.   Smoking.   Lack of exercise.   Excessive weight.    Drug/tobacco/alcohol use.   Eating less salt.  Most people do not have symptoms from high blood pressure until it has caused damage to the body. Effective treatment can often prevent, delay or reduce that damage. TREATMENT  When a cause has been identified, treatment for high blood pressure is directed at the cause. There are a large number of medications to treat HTN. These fall into several categories, and your caregiver will help you select the medicines that are best for you. Medications may have side effects. You should review side effects with your caregiver. If your blood pressure stays high after you have made lifestyle changes or started on medicines,   Your medication(s) may need to be changed.   Other problems may need to be addressed.   Be certain you understand your prescriptions, and know how and when to take your medicine.   Be sure to follow up with your caregiver within the time frame advised (usually within two weeks) to have your blood pressure rechecked and to review your medications.   If you are taking more than one medicine to lower your blood pressure, make sure you know how and at what times they should be taken. Taking two medicines at the same time can result in blood pressure that is too low.  SEEK IMMEDIATE MEDICAL CARE IF:  You develop a severe headache, blurred or changing vision, or confusion.   You have unusual weakness or numbness, or a faint feeling.   You have severe chest or abdominal pain, vomiting, or breathing problems.  MAKE SURE YOU:   Understand these instructions.   Will watch your condition.   Will get help right away if you are not doing well or get worse.  Document Released: 08/19/2005 Document Revised: 08/08/2011 Document Reviewed: 04/08/2008 [facility] Patient Information 2012 [place], [place].

## 2012-03-02 NOTE — ED Notes (Signed)
Pt reported to rn that her throat hurts. md alerted

## 2012-03-02 NOTE — ED Notes (Signed)
Per ems pt is alert and oriented x4. Ambulatory. Pt is from home. About an hour ago pt ate shrimp, no previous allergy to shrimp. Pt woke up with headache, then itching, then noticed her left eye was starting to swell shut.

## 2012-03-02 NOTE — ED Provider Notes (Addendum)
History     CSN: [id number]  Arrival date & time 03/02/12  1519   First MD Initiated Contact with Patient 03/02/12 1542      Chief Complaint  Patient presents with  . Allergic Reaction    (Consider location/radiation/quality/duration/timing/severity/associated sxs/prior treatment) Patient is a 41 y.o. female presenting with allergic reaction. The history is provided by the patient.  Allergic Reaction The primary symptoms do not include cough, abdominal pain, vomiting, diarrhea or rash.  Significant symptoms that are not present include eye redness.  pt states shortly pta developed rash/hives to chest, upper arms/axilla, felt sl wheezy. No throat closing. No faintness or dizziness. States similar rxn to various foods in past. Pt states symptoms started approximately 1-1/5 hrs after eating shrimp. States has eaten shrimp and shellfish multiple x in past without adverse rxn. States various other food allergies but doesn't believe she ate any of those foods - states in past has had milder hives. No hx anaphylaxis. Denies any change in meds/new meds today. Denies change in home or personal products of any sort. No recent febrile/viral illness.      Past Medical History  Diagnosis Date  . Diabetes mellitus   . Hypertension   . Bronchitis   . Arthritis   . GERD (gastroesophageal reflux disease)   . Asthma   . Migraines 02/21/2012  . Anemia 02/21/2012  . HTN (hypertension) 02/21/2012  . Diabetes mellitus 02/21/2012    Past Surgical History  Procedure Date  . Cholecystectomy 09/27/2001    Family History  Problem Relation Age of Onset  . Coronary artery disease Father     History  Substance Use Topics  . Smoking status: Never Smoker   . Smokeless tobacco: Not on file  . Alcohol Use: No    OB History    Grav Para Term Preterm Abortions TAB SAB Ect Mult Living                  Review of Systems  Constitutional: Negative for fever and chills.  HENT: Negative for sore  throat and neck pain.   Eyes: Negative for redness.  Respiratory: Negative for cough.   Cardiovascular: Negative for chest pain.  Gastrointestinal: Negative for vomiting, abdominal pain and diarrhea.  Genitourinary: Negative for flank pain.  Musculoskeletal: Negative for back pain.  Skin: Negative for rash.  Neurological: Negative for headaches.  Hematological: Does not bruise/bleed easily.  Psychiatric/Behavioral: Negative for confusion.    Allergies  Penicillins and Shrimp  Home Medications   Current Outpatient Rx  Name Route Sig Dispense Refill  . ALBUTEROL SULFATE HFA 108 (90 BASE) MCG/ACT IN AERS Inhalation Inhale 2 puffs into the lungs every 6 (six) hours as needed. For wheezing    . ASPIRIN-ACETAMINOPHEN-CAFFEINE 250-250-65 MG PO TABS Oral Take 1 tablet by mouth every 6 (six) hours as needed. For migraines    . INSULIN ASPART 100 UNIT/ML [place] SOLN Subcutaneous Inject 4-15 Units into the skin 3 (three) times daily before meals. Sliding scale insulin    . LISINOPRIL 20 MG PO TABS Oral Take 1 tablet (20 mg total) by mouth daily. 31 tablet 0  . METFORMIN HCL 1000 MG PO TABS Oral Take 2,000 mg by mouth at bedtime.    Marland Kitchen PANTOPRAZOLE SODIUM 40 MG PO TBEC Oral Take 1 tablet (40 mg total) by mouth daily at 6 (six) AM. 31 tablet 0  . PROPRANOLOL HCL 10 MG PO TABS Oral Take 1 tablet (10 mg total) by mouth 2 (two) times  daily. 62 tablet 0    BP 162/101  Temp 99.5 F (37.5 C)  Resp 21  SpO2 100%  LMP 01/29/2012  Physical Exam  Nursing note and vitals reviewed. Constitutional: She is oriented to person, place, and time. She appears well-developed and well-nourished. No distress.  HENT:  Nose: Nose normal.  Mouth/Throat: Oropharynx is clear and moist.  Eyes: Conjunctivae are normal. No scleral icterus.  Neck: Neck supple. No tracheal deviation present.  Cardiovascular: Normal rate, regular rhythm, normal heart sounds and intact distal pulses.   Pulmonary/Chest: Effort normal and  breath sounds normal. No respiratory distress. She has no wheezes.  Abdominal: Soft. Normal appearance and bowel sounds are normal. She exhibits no distension. There is no tenderness.  Musculoskeletal: She exhibits no edema and no tenderness.  Neurological: She is alert and oriented to person, place, and time.  Skin: Skin is warm and dry. No rash noted. She is not diaphoretic.  Psychiatric: She has a normal mood and affect.    ED Course  Procedures (including critical care time)     MDM  Iv ns. Solumedrol iv, pepcid iv and benadryl iv.   Recheck feels much improved. Hives almost entirely gone. No sob/wheezing.    Recheck pt asymptomatic. No c/o. No rash. No wheezing.       Suzi Roots, MD 03/02/12 1716  Suzi Roots, MD 03/02/12 [phone]

## 2012-03-03 ENCOUNTER — Telehealth: Payer: Self-pay

## 2012-03-03 NOTE — Telephone Encounter (Signed)
Pt was seen in Er due to reaction to Shrimp and was advised to set an appt with Dr. Maple Hudson. Appt set for 04/13/12 pt aware. Carron Curie, CMA

## 2012-04-13 ENCOUNTER — Institutional Professional Consult (permissible substitution): Payer: Medicaid Other | Admitting: Internal Medicine

## 2012-05-18 ENCOUNTER — Inpatient Hospital Stay (HOSPITAL_COMMUNITY): Payer: Medicaid Other

## 2012-05-18 ENCOUNTER — Inpatient Hospital Stay (HOSPITAL_COMMUNITY)
Admission: AD | Admit: 2012-05-18 | Discharge: 2012-05-18 | Disposition: A | Discharge status: Home / Self Care | Payer: Medicaid Other | Source: Ambulatory Visit | Attending: Family Medicine | Admitting: Family Medicine

## 2012-05-18 ENCOUNTER — Encounter (HOSPITAL_COMMUNITY): Payer: Self-pay | Admitting: *Deleted

## 2012-05-18 DIAGNOSIS — Z3490 Encounter for supervision of normal pregnancy, unspecified, unspecified trimester: Secondary | ICD-10-CM

## 2012-05-18 DIAGNOSIS — O30109 Triplet pregnancy, unspecified number of placenta and unspecified number of amniotic sacs, unspecified trimester: Secondary | ICD-10-CM | POA: Insufficient documentation

## 2012-05-18 DIAGNOSIS — IMO0002 Reserved for concepts with insufficient information to code with codable children: Secondary | ICD-10-CM | POA: Insufficient documentation

## 2012-05-18 DIAGNOSIS — E119 Type 2 diabetes mellitus without complications: Secondary | ICD-10-CM | POA: Insufficient documentation

## 2012-05-18 DIAGNOSIS — Z331 Pregnant state, incidental: Secondary | ICD-10-CM

## 2012-05-18 DIAGNOSIS — O10919 Unspecified pre-existing hypertension complicating pregnancy, unspecified trimester: Secondary | ICD-10-CM

## 2012-05-18 DIAGNOSIS — O24919 Unspecified diabetes mellitus in pregnancy, unspecified trimester: Secondary | ICD-10-CM | POA: Insufficient documentation

## 2012-05-18 LAB — CBC WITH DIFFERENTIAL/PLATELET
Basophils Absolute: 0 10*3/uL (ref 0.0–0.1)
Basophils Relative: 0 % (ref 0–1)
Eosinophils Absolute: 0.1 10*3/uL (ref 0.0–0.7)
Hemoglobin: 8.6 g/dL — ABNORMAL LOW (ref 12.0–15.0)
MCH: 28.1 pg (ref 26.0–34.0)
MCHC: 32.7 g/dL (ref 30.0–36.0)
Monocytes Relative: 6 % (ref 3–12)
Neutrophils Relative %: 73 % (ref 43–77)
RDW: 14.5 % (ref 11.5–15.5)

## 2012-05-18 LAB — URINALYSIS, ROUTINE W REFLEX MICROSCOPIC
Bilirubin Urine: NEGATIVE
Glucose, UA: NEGATIVE mg/dL
Hgb urine dipstick: NEGATIVE
Specific Gravity, Urine: >1.03 — ABNORMAL HIGH (ref 1.005–1.030)
Urobilinogen, UA: 0.2 mg/dL (ref 0.0–1.0)

## 2012-05-18 LAB — BASIC METABOLIC PANEL
CO2: 23 mEq/L (ref 19–32)
Calcium: 9.5 mg/dL (ref 8.4–10.5)
Creatinine, Ser: 1.04 mg/dL (ref 0.50–1.10)
GFR calc non Af Amer: 67 mL/min — ABNORMAL LOW (ref >90)
Glucose, Bld: 111 mg/dL — ABNORMAL HIGH (ref 70–99)

## 2012-05-18 LAB — WET PREP, GENITAL
Trich, Wet Prep: NONE SEEN
Yeast Wet Prep HPF POC: NONE SEEN

## 2012-05-18 LAB — ABO/RH: ABO/RH(D): A POS

## 2012-05-18 MED ORDER — LABETALOL HCL 100 MG PO TABS: 200.0000 mg | ORAL_TABLET | Freq: Two times a day (BID) | ORAL | Status: DC

## 2012-05-18 NOTE — MAU Note (Signed)
Pt LMP 03/21/2012, + UPT 9/14.  Pt diabetic, HTN and migraines, concerned because of all the medications she takes.  First appt 06/15/2012.  Denies pain or bleeding.

## 2012-05-18 NOTE — MAU Provider Note (Signed)
Chart reviewed and agree with management and plan.  

## 2012-05-18 NOTE — MAU Provider Note (Addendum)
History     CSN: [id number]  Arrival date and time: 05/18/12 1850   None     Chief Complaint  Patient presents with  . Possible Pregnancy   HPI Carla Brooks is a 41 y.o. female @ 8 w 2 d gestation who presents to MAU with early pregnancy. She is concerned due to her Insulin Dependent Diabetes. She had a positive home pregnancy test 2 days ago. Has had nausea and vomiting. LMP 03/21/12. Denies vaginal bleeding or discharge. Has had some discomfort in upper abdomen. The history was provided by the patient.    OB History    Grav Para Term Preterm Abortions TAB SAB Ect Mult Living   2 2 2       2       Past Medical History  Diagnosis Date  . Diabetes mellitus   . Hypertension   . Bronchitis   . Arthritis   . GERD (gastroesophageal reflux disease)   . Asthma   . Migraines 02/21/2012  . Anemia 02/21/2012  . HTN (hypertension) 02/21/2012  . Diabetes mellitus 02/21/2012    Past Surgical History  Procedure Date  . Cholecystectomy 09/27/2001    Family History  Problem Relation Age of Onset  . Coronary artery disease Father     History  Substance Use Topics  . Smoking status: Never Smoker   . Smokeless tobacco: Not on file  . Alcohol Use: No    Allergies:  Allergies  Allergen Reactions  . Penicillins Hives  . Shrimp (Shellfish Allergy) Hives and Swelling    Prescriptions prior to admission  Medication Sig Dispense Refill  . albuterol (PROVENTIL HFA;VENTOLIN HFA) 108 (90 BASE) MCG/ACT inhaler Inhale 2 puffs into the lungs every 6 (six) hours as needed. For wheezing      . aspirin-acetaminophen-caffeine (EXCEDRIN MIGRAINE) 250-250-65 MG per tablet Take 1 tablet by mouth every 6 (six) hours as needed. For migraines      . EPINEPHrine (EPIPEN) 0.3 mg/0.3 mL DEVI Inject 0.3 mLs (0.3 mg total) into the muscle as needed. Use as directed in the event of a severe allergic reaction  1 Device  3  . insulin aspart (NOVOLOG) 100 UNIT/ML injection Inject 4-15 Units into the skin  3 (three) times daily before meals. Sliding scale insulin      . lisinopril (PRINIVIL,ZESTRIL) 20 MG tablet Take 1 tablet (20 mg total) by mouth daily.  31 tablet  0  . metFORMIN (GLUCOPHAGE) 1000 MG tablet Take 2,000 mg by mouth at bedtime.      . pantoprazole (PROTONIX) 40 MG tablet Take 1 tablet (40 mg total) by mouth daily at 6 (six) AM.  31 tablet  0  . propranolol (INDERAL) 10 MG tablet Take 1 tablet (10 mg total) by mouth 2 (two) times daily.  62 tablet  0    Review of Systems  Constitutional: Positive for malaise/fatigue. Negative for fever, chills and weight loss.  HENT: Negative for ear pain, nosebleeds, congestion and sore throat.   Eyes: Negative for blurred vision, double vision and photophobia.  Respiratory: Negative for cough and wheezing.   Cardiovascular: Negative for chest pain and palpitations.  Gastrointestinal: Positive for heartburn, nausea and vomiting. Negative for abdominal pain, diarrhea and constipation.  Genitourinary: Positive for frequency. Negative for dysuria, urgency and flank pain.  Musculoskeletal: Positive for back pain.  Skin: Negative.   Neurological: Positive for headaches. Negative for dizziness and seizures.  Psychiatric/Behavioral: Positive for depression (dx with Bipolar but stopped medication a year ago.).  The patient is not nervous/anxious and does not have insomnia.    Physical Exam   Blood pressure 142/82, pulse 90, temperature 98.3 F (36.8 C), temperature source Oral, resp. rate 16, height 5\' 9"  (1.753 m), weight 192 lb 12.8 oz (87.454 kg), last menstrual period 03/21/2012.  Physical Exam  Nursing note and vitals reviewed. Constitutional: She is oriented to person, place, and time. She appears well-developed and well-nourished. No distress.  HENT:  Head: Normocephalic and atraumatic.  Eyes: EOM are normal.  Neck: Neck supple.  Cardiovascular: Normal rate.   Respiratory: Effort normal.  GI: Soft. There is no tenderness.    Genitourinary:       External genitalia without lesions. White discharge vaginal vault. Cervix closed. NO CMT, no adnexal tenderness, uterus without palpable enlargement.  Musculoskeletal: Normal range of motion.  Neurological: She is alert and oriented to person, place, and time.  Skin: Skin is warm and dry.  Psychiatric: She has a normal mood and affect. Her behavior is normal. Judgment and thought content normal.   Assessment: 41 y.o. female with diabetes and early pregnancy Plan: MAU Course: @ 20:45 pm care turned over to Wynelle Bourgeois, CNM. Patient awaiting ultrasound.   Procedures  NEESE,HOPE, RN, FNP, [facility] 05/18/2012, 7:38 PM   [place]:  Triplet gestation with only Triplet B having a heartrate of 192. Other two demised.          Living IUP is 9.1weeks.  Discussed with patient.  Pt states has stopped ALL of her meds, including insulin.  I recommended that she resume her Metformin and Insulin and will change her Lisinopril and Bisystolic to Labetalol.    Will have [facility] clinic get her an appt ASAP. Had her last baby with [facility]  Wynelle Bourgeois CNM

## 2012-05-19 LAB — GC/CHLAMYDIA PROBE AMP, GENITAL
Chlamydia, DNA Probe: NEGATIVE
GC Probe Amp, Genital: NEGATIVE

## 2012-05-20 NOTE — MAU Provider Note (Signed)
Chart reviewed and agree with management and plan.  

## 2012-05-21 ENCOUNTER — Other Ambulatory Visit (HOSPITAL_COMMUNITY): Payer: Self-pay | Admitting: Nurse Practitioner

## 2012-05-21 DIAGNOSIS — O30109 Triplet pregnancy, unspecified number of placenta and unspecified number of amniotic sacs, unspecified trimester: Secondary | ICD-10-CM

## 2012-05-28 ENCOUNTER — Encounter: Payer: Medicaid Other | Admitting: Obstetrics and Gynecology

## 2012-06-01 ENCOUNTER — Other Ambulatory Visit (HOSPITAL_COMMUNITY)
Admission: RE | Admit: 2012-06-01 | Discharge: 2012-06-01 | Disposition: A | Discharge status: Home / Self Care | Payer: Medicaid Other | Source: Ambulatory Visit | Attending: Family Medicine | Admitting: Family Medicine

## 2012-06-01 ENCOUNTER — Other Ambulatory Visit: Payer: Self-pay | Admitting: Family Medicine

## 2012-06-01 ENCOUNTER — Encounter: Payer: Medicaid Other | Attending: Family Medicine | Admitting: Dietician

## 2012-06-01 ENCOUNTER — Encounter: Payer: Self-pay | Admitting: Family Medicine

## 2012-06-01 ENCOUNTER — Ambulatory Visit (INDEPENDENT_AMBULATORY_CARE_PROVIDER_SITE_OTHER): Payer: Medicaid Other | Admitting: Family Medicine

## 2012-06-01 VITALS — BP 149/98 | Temp 97.9°F | Wt 193.5 lb

## 2012-06-01 DIAGNOSIS — Z3682 Encounter for antenatal screening for nuchal translucency: Secondary | ICD-10-CM

## 2012-06-01 DIAGNOSIS — O09529 Supervision of elderly multigravida, unspecified trimester: Secondary | ICD-10-CM | POA: Insufficient documentation

## 2012-06-01 DIAGNOSIS — O10019 Pre-existing essential hypertension complicating pregnancy, unspecified trimester: Secondary | ICD-10-CM

## 2012-06-01 DIAGNOSIS — Z713 Dietary counseling and surveillance: Secondary | ICD-10-CM | POA: Insufficient documentation

## 2012-06-01 DIAGNOSIS — O99810 Abnormal glucose complicating pregnancy: Secondary | ICD-10-CM | POA: Insufficient documentation

## 2012-06-01 DIAGNOSIS — R87810 Cervical high risk human papillomavirus (HPV) DNA test positive: Secondary | ICD-10-CM | POA: Insufficient documentation

## 2012-06-01 DIAGNOSIS — E119 Type 2 diabetes mellitus without complications: Secondary | ICD-10-CM

## 2012-06-01 DIAGNOSIS — Z01419 Encounter for gynecological examination (general) (routine) without abnormal findings: Secondary | ICD-10-CM | POA: Insufficient documentation

## 2012-06-01 DIAGNOSIS — O24919 Unspecified diabetes mellitus in pregnancy, unspecified trimester: Secondary | ICD-10-CM | POA: Insufficient documentation

## 2012-06-01 DIAGNOSIS — O119 Pre-existing hypertension with pre-eclampsia, unspecified trimester: Secondary | ICD-10-CM

## 2012-06-01 DIAGNOSIS — Z1151 Encounter for screening for human papillomavirus (HPV): Secondary | ICD-10-CM | POA: Insufficient documentation

## 2012-06-01 DIAGNOSIS — O0990 Supervision of high risk pregnancy, unspecified, unspecified trimester: Secondary | ICD-10-CM | POA: Insufficient documentation

## 2012-06-01 HISTORY — DX: Pre-existing hypertension with pre-eclampsia, unspecified trimester: O11.9

## 2012-06-01 LAB — DIFFERENTIAL
Basophils Relative: 0 % (ref 0–1)
Eosinophils Absolute: 0.1 10*3/uL (ref 0.0–0.7)
Monocytes Relative: 6 % (ref 3–12)
Neutrophils Relative %: 77 % (ref 43–77)

## 2012-06-01 LAB — POCT URINALYSIS DIP (DEVICE)
Glucose, UA: NEGATIVE mg/dL
Hgb urine dipstick: NEGATIVE
Nitrite: NEGATIVE
Urobilinogen, UA: 1 mg/dL (ref 0.0–1.0)
pH: 5.5 (ref 5.0–8.0)

## 2012-06-01 LAB — CBC
Hemoglobin: 9.3 g/dL — ABNORMAL LOW (ref 12.0–15.0)
MCH: 29.1 pg (ref 26.0–34.0)
MCHC: 35 g/dL (ref 30.0–36.0)
Platelets: 455 10*3/uL — ABNORMAL HIGH (ref 150–400)

## 2012-06-01 LAB — HEMOGLOBIN A1C: Hgb A1c MFr Bld: 5.8 % — ABNORMAL HIGH (ref <5.7)

## 2012-06-01 LAB — HEPATITIS B SURFACE ANTIGEN: Hepatitis B Surface Ag: NEGATIVE

## 2012-06-01 MED ORDER — INSULIN NPH (HUMAN) (ISOPHANE) 100 UNIT/ML ~~LOC~~ SUSP: 10.0000 [IU] | Freq: Two times a day (BID) | SUBCUTANEOUS | Status: DC

## 2012-06-01 MED ORDER — INFLUENZA VIRUS VACC SPLIT PF IM SUSP: 0.5000 mL | Freq: Once | INTRAMUSCULAR | Status: DC

## 2012-06-01 MED ORDER — INSULIN ASPART 100 UNIT/ML ~~LOC~~ SOLN: 12.0000 [IU] | Freq: Three times a day (TID) | SUBCUTANEOUS | Status: DC

## 2012-06-01 NOTE — Progress Notes (Signed)
Nutrition note: 1st visit consult Pt has type 2 DM since 2010, is overweight, has GERD, asthma, and high blood pressure.  Pt reports fasting BS usually 115-120. Pt has gained 6.5# @[redacted]w[redacted]d , which is slightly > expected. Pt has not started PNV yet & takes insulin. Pt reports eating 3 meals & "snacks all day." Pt drinks water, milk & diet soda. Pt stated she is active some days. Pt reports N&V & is allergic to shrimp. Pt given verbal & written education for DM diet during pregnancy. Disc wt gain goals of 15-25#. Disc importance of BF & tips to decrease N&V. Disc importance of physical activity & PNV. Pt agrees to follow DM diet including 3 meals & 3 snacks with proper CHO/ protein combination. Pt does not receive WIC but plans to apply. Pt does not plan to BF but was receptive to idea of pumping in beginning to give baby some breastmilk.  F/u in 2-4 wks Blondell Reveal, MS, RD, LDN

## 2012-06-01 NOTE — Progress Notes (Signed)
P - 91 Occasional pain

## 2012-06-01 NOTE — Patient Instructions (Addendum)
You need to change your insulin-You will now be on a long and short acting type of insulin.  You can stop taking your metformin. Please eat 3 meals and 3 snacks.  Include protein (meat, dairy-cheese, eggs, nuts) with all meals. Please check your blood sugars 4 times daily  (fasting, and 2 hours after meals) and bring your book and your meter to every appointment. Goals for treatment are first blood sugar prior to eating is less than 90.  2 hours after meals should be less than 120.  Pregnancy - First Trimester During sexual intercourse, millions of sperm go into the vagina. Only 1 sperm will penetrate and fertilize the female egg while it is in the Fallopian tube. One week later, the fertilized egg implants into the wall of the uterus. An embryo begins to develop into a baby. At 6 to 8 weeks, the eyes and face are formed and the heartbeat can be seen on ultrasound. At the end of 12 weeks (first trimester), all the baby's organs are formed. Now that you are pregnant, you will want to do everything you can to have a healthy baby. Two of the most important things are to get good prenatal care and follow your caregiver's instructions. Prenatal care is all the medical care you receive before the baby's birth. It is given to prevent, find, and treat problems during the pregnancy and childbirth. PRENATAL EXAMS  During prenatal visits, your weight, blood pressure and urine are checked. This is done to make sure you are healthy and progressing normally during the pregnancy.   A pregnant woman should gain 25 to 35 pounds during the pregnancy. However, if you are over weight or underweight, your caregiver will advise you regarding your weight.   Your caregiver will ask and answer questions for you.   Blood work, cervical cultures, other necessary tests and a Pap test are done during your prenatal exams. These tests are done to check on your health and the probable health of your baby. Tests are strongly  recommended and done for HIV with your permission. This is the virus that causes AIDS. These tests are done because medications can be given to help prevent your baby from being born with this infection should you have been infected without knowing it. Blood work is also used to find out your blood type, previous infections and follow your blood levels (hemoglobin).   Low hemoglobin (anemia) is common during pregnancy. Iron and vitamins are given to help prevent this. Later in the pregnancy, blood tests for diabetes will be done along with any other tests if any problems develop. You may need tests to make sure you and the baby are doing well.   You may need other tests to make sure you and the baby are doing well.  CHANGES DURING THE FIRST TRIMESTER (THE FIRST 3 MONTHS OF PREGNANCY) Your body goes through many changes during pregnancy. They vary from person to person. Talk to your caregiver about changes you notice and are concerned about. Changes can include:  Your menstrual period stops.   The egg and sperm carry the genes that determine what you look like. Genes from you and your partner are forming a baby. The female genes determine whether the baby is a boy or a girl.   Your body increases in girth and you may feel bloated.   Feeling sick to your stomach (nauseous) and throwing up (vomiting). If the vomiting is uncontrollable, call your caregiver.   Your breasts will  begin to enlarge and become tender.   Your nipples may stick out more and become darker.   The need to urinate more. Painful urination may mean you have a bladder infection.   Tiring easily.   Loss of appetite.   Cravings for certain kinds of food.   At first, you may gain or lose a couple of pounds.   You may have changes in your emotions from day to day (excited to be pregnant or concerned something may go wrong with the pregnancy and baby).   You may have more vivid and strange dreams.  HOME CARE INSTRUCTIONS    It is very important to avoid all smoking, alcohol and un-prescribed drugs during your pregnancy. These affect the formation and growth of the baby. Avoid chemicals while pregnant to ensure the delivery of a healthy infant.   Start your prenatal visits by the 12th week of pregnancy. They are usually scheduled monthly at first, then more often in the last 2 months before delivery. Keep your caregiver's appointments. Follow your caregiver's instructions regarding medication use, blood and lab tests, exercise, and diet.   During pregnancy, you are providing food for you and your baby. Eat regular, well-balanced meals. Choose foods such as meat, fish, milk and other low fat dairy products, vegetables, fruits, and whole-grain breads and cereals. Your caregiver will tell you of the ideal weight gain.   You can help morning sickness by keeping soda crackers at the bedside. Eat a couple before arising in the morning. You may want to use the crackers without salt on them.   Eating 4 to 5 small meals rather than 3 large meals a day also may help the nausea and vomiting.   Drinking liquids between meals instead of during meals also seems to help nausea and vomiting.   A physical sexual relationship may be continued throughout pregnancy if there are no other problems. Problems may be early (premature) leaking of amniotic fluid from the membranes, vaginal bleeding, or belly (abdominal) pain.   Exercise regularly if there are no restrictions. Check with your caregiver or physical therapist if you are unsure of the safety of some of your exercises. Greater weight gain will occur in the last 2 trimesters of pregnancy. Exercising will help:   Control your weight.   Keep you in shape.   Prepare you for labor and delivery.   Help you lose your pregnancy weight after you deliver your baby.   Wear a good support or jogging bra for breast tenderness during pregnancy. This may help if worn during sleep too.    Ask when prenatal classes are available. Begin classes when they are offered.   Do not use hot tubs, steam rooms or saunas.   Wear your seat belt when driving. This protects you and your baby if you are in an accident.   Avoid raw meat, uncooked cheese, cat litter boxes and soil used by cats throughout the pregnancy. These carry germs that can cause birth defects in the baby.   The first trimester is a good time to visit your dentist for your dental health. Getting your teeth cleaned is OK. Use a softer toothbrush and brush gently during pregnancy.   Ask for help if you have financial, counseling or nutritional needs during pregnancy. Your caregiver will be able to offer counseling for these needs as well as refer you for other special needs.   Do not take any medications or herbs unless told by your caregiver.  Inform your caregiver if there is any mental or physical domestic violence.   Make a list of emergency phone numbers of family, friends, hospital, and police and fire departments.   Write down your questions. Take them to your prenatal visit.   Do not douche.   Do not cross your legs.   If you have to stand for long periods of time, rotate you feet or take small steps in a circle.   You may have more vaginal secretions that may require a sanitary pad. Do not use tampons or scented sanitary pads.  MEDICATIONS AND DRUG USE IN PREGNANCY  Take prenatal vitamins as directed. The vitamin should contain 1 milligram of folic acid. Keep all vitamins out of reach of children. Only a couple vitamins or tablets containing iron may be fatal to a baby or young child when ingested.   Avoid use of all medications, including herbs, over-the-counter medications, not prescribed or suggested by your caregiver. Only take over-the-counter or prescription medicines for pain, discomfort, or fever as directed by your caregiver. Do not use aspirin, ibuprofen, or naproxen unless directed by your  caregiver.   Let your caregiver also know about herbs you may be using.   Alcohol is related to a number of birth defects. This includes fetal alcohol syndrome. All alcohol, in any form, should be avoided completely. Smoking will cause low birth rate and premature babies.   Street or illegal drugs are very harmful to the baby. They are absolutely forbidden. A baby born to an addicted mother will be addicted at birth. The baby will go through the same withdrawal an adult does.   Let your caregiver know about any medications that you have to take and for what reason you take them.  MISCARRIAGE IS COMMON DURING PREGNANCY A miscarriage does not mean you did something wrong. It is not a reason to worry about getting pregnant again. Your caregiver will help you with questions you may have. If you have a miscarriage, you may need minor surgery. SEEK MEDICAL CARE IF:  You have any concerns or worries during your pregnancy. It is better to call with your questions if you feel they cannot wait, rather than worry about them. SEEK IMMEDIATE MEDICAL CARE IF:   An unexplained oral temperature above 102 F (38.9 C) develops, or as your caregiver suggests.   You have leaking of fluid from the vagina (birth canal). If leaking membranes are suspected, take your temperature and inform your caregiver of this when you call.   There is vaginal spotting or bleeding. Notify your caregiver of the amount and how many pads are used.   You develop a bad smelling vaginal discharge with a change in the color.   You continue to feel sick to your stomach (nauseated) and have no relief from remedies suggested. You vomit blood or coffee ground-like materials.   You lose more than 2 pounds of weight in 1 week.   You gain more than 2 pounds of weight in 1 week and you notice swelling of your face, hands, feet, or legs.   You gain 5 pounds or more in 1 week (even if you do not have swelling of your hands, face, legs, or  feet).   You get exposed to [place] measles and have never had them.   You are exposed to fifth disease or chickenpox.   You develop belly (abdominal) pain. Round ligament discomfort is a common non-cancerous (benign) cause of abdominal pain in pregnancy. Your caregiver  still must evaluate this.   You develop headache, fever, diarrhea, pain with urination, or shortness of breath.   You fall or are in a car accident or have any kind of trauma.   There is mental or physical violence in your home.  Document Released: 08/13/2001 Document Revised: 08/08/2011 Document Reviewed: 02/14/2009 [facility] Patient Information 2012 [place], [place]. Gestational Diabetes Mellitus Gestational diabetes mellitus (GDM) is diabetes that occurs only during pregnancy. This happens when the body cannot properly handle the glucose (sugar) that increases in the blood after eating. During pregnancy, insulin resistance (reduced sensitivity to insulin) occurs because of the release of hormones from the placenta. Usually, the pancreas of pregnant women produces enough insulin to overcome the resistance that occurs. However, in gestational diabetes, the insulin is there but it does not work effectively. If the resistance is severe enough that the pancreas does not produce enough insulin, extra glucose builds up in the blood.  WHO IS AT RISK FOR DEVELOPING GESTATIONAL DIABETES?  Women with a history of diabetes in the family.   Women over age 63.   Women who are overweight.   Women in certain ethnic groups (Hispanic, African American, Native American, [place] and [place]er).  WHAT CAN HAPPEN TO THE BABY? If the mother's blood glucose is too high while she is pregnant, the extra sugar will travel through the umbilical cord to the baby. Some of the problems the baby may have are:  Large Baby - If the baby receives too much sugar, the baby will gain more weight. This may cause the baby to be too large to be born  normally (vaginally) and a Cesarean section (C-section) may be needed.   Low Blood Glucose (hypoglycemia) - The baby makes extra insulin, in response to the extra sugar its gets from its mother. When the baby is born and no longer needs this extra insulin, the baby's blood glucose level may drop.   Jaundice (yellow coloring of the skin and eyes) - This is fairly common in babies. It is caused from a build-up of the chemical called bilirubin. This is rarely serious, but is seen more often in babies whose mothers had gestational diabetes.  RISKS TO THE MOTHER Women who have had gestational diabetes may be at higher risk for some problems, including:  Preeclampsia or toxemia, which includes problems with high blood pressure. Blood pressure and protein levels in the urine must be checked frequently.   Infections.   Cesarean section (C-section) for delivery.   Developing Type 2 diabetes later in life. About 30-50% will develop diabetes later, especially if obese.  DIAGNOSIS  The hormones that cause insulin resistance are highest at about 24-28 weeks of pregnancy. If symptoms are experienced, they are much like symptoms you would normally expect during pregnancy.  GDM is often diagnosed using a two part method: 1. After 24-28 weeks of pregnancy, the woman drinks a glucose solution and takes a blood test. If the glucose level is high, a second test will be given.  2. Oral Glucose Tolerance Test (OGTT) which is 3 hours long - After not eating overnight, the blood glucose is checked. The woman drinks a glucose solution, and hourly blood glucose tests are taken.  If the woman has risk factors for GDM, the caregiver may test earlier than 24 weeks of pregnancy. TREATMENT  Treatment of GDM is directed at keeping the mother's blood glucose level normal, and may include:  Meal planning.   Taking insulin or other medicine to control  your blood glucose level.   Exercise.   Keeping a daily record of the  foods you eat.   Blood glucose monitoring and keeping a record of your blood glucose levels.   May monitor ketone levels in the urine, although this is no longer considered necessary in most pregnancies.  HOME CARE INSTRUCTIONS  While you are pregnant:  Follow your caregiver's advice regarding your prenatal appointments, meal planning, exercise, medicines, vitamins, blood and other tests, and physical activities.   Keep a record of your meals, blood glucose tests, and the amount of insulin you are taking (if any). Show this to your caregiver at every prenatal visit.   If you have GDM, you may have problems with hypoglycemia (low blood glucose). You may suspect this if you become suddenly dizzy, feel shaky, and/or weak. If you think this is happening and you have a glucose meter, try to test your blood glucose level. Follow your caregiver's advice for when and how to treat your low blood glucose. Generally, the 15:15 rule is followed: Treat by consuming 15 grams of carbohydrates, wait 15 minutes, and recheck blood glucose. Examples of 15 grams of carbohydrates are:   1 cup skim or low-fat milk.    cup juice.   3-4 glucose tablets.   5-6 hard candies.   1 small box raisins.    cup regular soda pop.   Practice good hygiene, to avoid infections.   Do not smoke.  SEEK MEDICAL CARE IF:   You develop abnormal vaginal discharge, with or without itching.   You become weak and tired more than expected.   You seem to sweat a lot.   You have a sudden increase in weight, 5 pounds or more in one week.   You are losing weight, 3 pounds or more in a week.   Your blood glucose level is high, and you need instructions on what to do about it.  SEEK IMMEDIATE MEDICAL CARE IF:   You develop a severe headache.   You faint or pass out.   You develop nausea and vomiting.   You become disoriented or confused.   You have a convulsion.   You develop vision problems.   You develop  stomach pain.   You develop vaginal bleeding.   You develop uterine contractions.   You have leaking or a gush of fluid from the vagina.  AFTER YOU HAVE THE BABY:  Go to all of your follow-up appointments, and have blood tests as advised by your caregiver.   Maintain a healthy lifestyle, to prevent diabetes in the future. This includes:   Following a healthy meal plan.   Controlling your weight.   Getting enough exercise and proper rest.   Do not smoke.   Breastfeed your baby if you can. This will lower the chance of you and your baby developing diabetes later in life.  For more information about diabetes, go to the American Diabetes Association at: PMFashions.com.cy. For more information about gestational diabetes, go to the [employer] at: RentRule.com.au. Document Released: 11/25/2000 Document Revised: 08/08/2011 Document Reviewed: 06/19/2009 [facility] Patient Information 2012 [place], [place]. Breastfeeding BENEFITS OF BREASTFEEDING For the baby  The first milk (colostrum) helps the baby's digestive system function better.   There are antibodies from the mother in the milk that help the baby fight off infections.   The baby has a lower incidence of asthma, allergies, and SIDS (sudden infant death syndrome).   The nutrients in breast milk  are better than formulas for the baby and helps the baby's brain grow better.   Babies who breastfeed have less gas, colic, and constipation.  For the mother  Breastfeeding helps develop a very special bond between mother and baby.   It is more convenient, always available at the correct temperature and cheaper than formula feeding.   It burns calories in the mother and helps with losing weight that was gained during pregnancy.   It makes the uterus contract back down to normal size faster and slows bleeding following delivery.   Breastfeeding mothers have a lower risk of  developing breast cancer.  NURSE FREQUENTLY  A healthy, full-term baby may breastfeed as often as every hour or space his or her feedings to every 3 hours.   How often to nurse will vary from baby to baby. Watch your baby for signs of hunger, not the clock.   Nurse as often as the baby requests, or when you feel the need to reduce the fullness of your breasts.   Awaken the baby if it has been 3 to 4 hours since the last feeding.   Frequent feeding will help the mother make more milk and will prevent problems like sore nipples and engorgement of the breasts.  BABY'S POSITION AT THE BREAST  Whether lying down or sitting, be sure that the baby's tummy is facing your tummy.   Support the breast with 4 fingers underneath the breast and the thumb above. Make sure your fingers are well away from the nipple and baby's mouth.   Stroke the baby's lips and cheek closest to the breast gently with your finger or nipple.   When the baby's mouth is open wide enough, place all of your nipple and as much of the dark area around the nipple as possible into your baby's mouth.   Pull the baby in close so the tip of the nose and the baby's cheeks touch the breast during the feeding.  FEEDINGS  The length of each feeding varies from baby to baby and from feeding to feeding.   The baby must suck about 2 to 3 minutes for your milk to get to him or her. This is called a "let down." For this reason, allow the baby to feed on each breast as long as he or she wants. Your baby will end the feeding when he or she has received the right balance of nutrients.   To break the suction, put your finger into the corner of the baby's mouth and slide it between his or her gums before removing your breast from his or her mouth. This will help prevent sore nipples.  REDUCING BREAST ENGORGEMENT  In the first week after your baby is born, you may experience signs of breast engorgement. When breasts are engorged, they feel  heavy, warm, full, and may be tender to the touch. You can reduce engorgement if you:   Nurse frequently, every 2 to 3 hours. Mothers who breastfeed early and often have fewer problems with engorgement.   Place light ice packs on your breasts between feedings. This reduces swelling. Wrap the ice packs in a lightweight towel to protect your skin.   Apply moist hot packs to your breast for 5 to 10 minutes before each feeding. This increases circulation and helps the milk flow.   Gently massage your breast before and during the feeding.   Make sure that the baby empties at least one breast at every feeding before switching sides.  Use a breast pump to empty the breasts if your baby is sleepy or not nursing well. You may also want to pump if you are returning to work or or you feel you are getting engorged.   Avoid bottle feeds, pacifiers or supplemental feedings of water or juice in place of breastfeeding.   Be sure the baby is latched on and positioned properly while breastfeeding.   Prevent fatigue, stress, and anemia.   Wear a supportive bra, avoiding underwire styles.   Eat a balanced diet with enough fluids.  If you follow these suggestions, your engorgement should improve in 24 to 48 hours. If you are still experiencing difficulty, call your lactation consultant or caregiver. IS MY BABY GETTING ENOUGH MILK? Sometimes, mothers worry about whether their babies are getting enough milk. You can be assured that your baby is getting enough milk if:  The baby is actively sucking and you hear swallowing.   The baby nurses at least 8 to 12 times in a 24 hour time period. Nurse your baby until he or she unlatches or falls asleep at the first breast (at least 10 to 20 minutes), then offer the second side.   The baby is wetting 5 to 6 disposable diapers (6 to 8 cloth diapers) in a 24 hour period by 57 to 61 days of age.   The baby is having at least 2 to 3 stools every 24 hours for the first  few months. Breast milk is all the food your baby needs. It is not necessary for your baby to have water or formula. In fact, to help your breasts make more milk, it is best not to give your baby supplemental feedings during the early weeks.   The stool should be soft and yellow.   The baby should gain 4 to 7 ounces per week after he is 67 days old.  TAKE CARE OF YOURSELF Take care of your breasts by:  Bathing or showering daily.   Avoiding the use of soaps on your nipples.   Start feedings on your left breast at one feeding and on your right breast at the next feeding.   You will notice an increase in your milk supply 2 to 5 days after delivery. You may feel some discomfort from engorgement, which makes your breasts very firm and often tender. Engorgement "peaks" out within 24 to 48 hours. In the meantime, apply warm moist towels to your breasts for 5 to 10 minutes before feeding. Gentle massage and expression of some milk before feeding will soften your breasts, making it easier for your baby to latch on. Wear a well fitting nursing bra and air dry your nipples for 10 to 15 minutes after each feeding.   Only use cotton bra pads.   Only use pure lanolin on your nipples after nursing. You do not need to wash it off before nursing.  Take care of yourself by:   Eating well-balanced meals and nutritious snacks.   Drinking milk, fruit juice, and water to satisfy your thirst (about 8 glasses a day).   Getting plenty of rest.   Increasing calcium in your diet (1200 mg a day).   Avoiding foods that you notice affect the baby in a bad way.  SEEK MEDICAL CARE IF:   You have any questions or difficulty with breastfeeding.   You need help.   You have a hard, red, sore area on your breast, accompanied by a fever of 100.5 F (38.1 C) or more.  Your baby is too sleepy to eat well or is having trouble sleeping.   Your baby is wetting less than 6 diapers per day, by 70 days of age.   Your  baby's skin or white part of his or her eyes is more yellow than it was in the hospital.   You feel depressed.  Document Released: 08/19/2005 Document Revised: 08/08/2011 Document Reviewed: 04/03/2009 [facility] Patient Information 2012 [place] [address], [place].

## 2012-06-01 NOTE — Progress Notes (Signed)
MFM appointment scheduled June 10, 2012 at 10 am for Genetic Counseling and Development worker, international aid. Patient will call her own Eye MD for a diabetic eye exam appointment. Will schedule Fetal Echo after obtaining okay from [facility] which is her primary MD. Patient is [place]. Patient given contact information for Dr. Elizebeth Brooking and advised she will be called with that apointment.

## 2012-06-01 NOTE — Progress Notes (Signed)
Diabetes Education:  Comes today with a friend.  Has a history of type 2 diabetes for about 3 years.  Has been on Novolog insulin previously.  Will not be using Novolin (NPH) and the Novolog at this time.  Before Breakfast: Novolin N= 10 units mixed with the Novolog 12 uniits.  Before lunch: Novolog 12 units. Before dinner Novolog 12 units and at bedtime, Novolin N=10 units.  Review of the mixing of the insulins in the morning.  She did a return demonstration using sterile water of the mixing process.  Review of the injection sites with the use of the abdomen and the thighs later on.  Provided an insulin kit BD [id number]  Expiration 2016/07/29.  Maggie Yashua Bracco, RN, CDE

## 2012-06-01 NOTE — Progress Notes (Signed)
  Subjective:    Carla Brooks is a 41 y.o. [id number] [redacted]w[redacted]d being seen today for her obstetrical visit.  Patient reports no complaints. Fetal movement: not yet.  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  She is a Class B diabetic and has been on SSI and metformin.  Admitted to the hospital with [facility] in 6/13, negative cardiac w/u, with neg. stress test and nml ECHO..  Objective:    BP 149/98  Temp 97.9 F (36.6 C)  Wt 193 lb 8 oz (87.771 kg)  LMP 03/21/2012  Physical Exam  General:  WDWN female in NAD HEENT:  [place]/AT, sclera without icterus, poor dentition. Neck:  Supple, nml thyroid. Lungs:  Clear bilaterally CV:  RRR, no rubs, gallops, murmurs Abd: Soft, mass in low abdomen consistent with fibroids. Ext: no C/C/E GU:  NEFG, BUS is WNL, Cervix is parous and without lesion, uterus is 18 wks size, and firm. Exam  FHT: Fetal Heart Rate (bpm): 172           Assessment:    Pregnancy:  [id number]   Patient Active Problem List   Diagnosis Date Noted  . Diabetes mellitus, antepartum-class B 06/01/2012    Priority: High  . Unspecified high-risk pregnancy 06/01/2012    Priority: High  . Benign essential hypertension antepartum 06/01/2012    Priority: Medium  . AMA (advanced maternal age) multigravida 35+ 06/01/2012    Priority: Medium  . Migraines 02/21/2012  . Anemia 02/21/2012  . GERD (gastroesophageal reflux disease) 02/21/2012  . HTN (hypertension) 02/21/2012  . Bronchitis 02/21/2012  . Diabetes mellitus 02/21/2012  . Arthritis 02/21/2012     Plan:    New OB labs, OB urine culture and pap-GC and Chlam are already negative. Baseline labs, 24 hr urine, TSH, Hgb A1C, Fetal ECHO, Opthalmology referral. MFM referral for AMA.   Follow up in 2 Weeks.

## 2012-06-02 ENCOUNTER — Inpatient Hospital Stay (HOSPITAL_COMMUNITY)
Admission: AD | Admit: 2012-06-02 | Discharge: 2012-06-02 | Disposition: A | Discharge status: Home / Self Care | Payer: Medicaid Other | Source: Ambulatory Visit | Attending: Obstetrics & Gynecology | Admitting: Obstetrics & Gynecology

## 2012-06-02 ENCOUNTER — Encounter (HOSPITAL_COMMUNITY): Payer: Self-pay | Admitting: *Deleted

## 2012-06-02 DIAGNOSIS — O10019 Pre-existing essential hypertension complicating pregnancy, unspecified trimester: Secondary | ICD-10-CM | POA: Insufficient documentation

## 2012-06-02 DIAGNOSIS — O10919 Unspecified pre-existing hypertension complicating pregnancy, unspecified trimester: Secondary | ICD-10-CM

## 2012-06-02 DIAGNOSIS — O209 Hemorrhage in early pregnancy, unspecified: Secondary | ICD-10-CM

## 2012-06-02 LAB — URINALYSIS, ROUTINE W REFLEX MICROSCOPIC
Bilirubin Urine: NEGATIVE
Glucose, UA: NEGATIVE mg/dL
Ketones, ur: NEGATIVE mg/dL
Protein, ur: 30 mg/dL — AB

## 2012-06-02 LAB — RUBELLA SCREEN: Rubella: 102.4 IU/mL — ABNORMAL HIGH

## 2012-06-02 LAB — URINE MICROSCOPIC-ADD ON

## 2012-06-02 LAB — TSH: TSH: 0.667 u[IU]/mL (ref 0.350–4.500)

## 2012-06-02 MED ORDER — LABETALOL HCL 100 MG PO TABS: 400.0000 mg | ORAL_TABLET | Freq: Two times a day (BID) | ORAL | Status: DC

## 2012-06-02 NOTE — MAU Provider Note (Signed)
Chief Complaint: Vaginal Bleeding  First Provider Initiated Contact with Patient 06/02/12 0453     SUBJECTIVE HPI: Carla Brooks is a 41 y.o. G3P2002 at [redacted]w[redacted]d by LMP who presents with pink when wiping tonight. Denies abd pain or passage of tissue. Confirmed IUP. Triplet pregnancy Dx 05/18/12 w/ demise of A and C. [facility] noted as well. First prenatal visit in [facility] yesterday. Pap, cultures done. FHR dopplered. On Labetalol for HTN and insulin for Type II DM.   Past Medical History  Diagnosis Date  . Diabetes mellitus   . Hypertension   . Bronchitis   . Arthritis   . GERD (gastroesophageal reflux disease)   . Asthma   . Migraines 02/21/2012  . Anemia 02/21/2012  . HTN (hypertension) 02/21/2012  . Diabetes mellitus 02/21/2012   OB History    Grav Para Term Preterm Abortions TAB SAB Ect Mult Living   3 2 2       2      # Outc Date GA Lbr Len/2nd Wgt Sex Del Anes PTL Lv   1 TRM 9/98 [redacted]w[redacted]d   M SVD None No Yes   2 TRM 9/00 [redacted]w[redacted]d   F SVD None No Yes   3 CUR              Past Surgical History  Procedure Date  . Cholecystectomy 09/27/2001  . No past surgeries    History   Social History  . Marital Status: Single    Spouse Name: N/A    Number of Children: N/A  . Years of Education: N/A   Occupational History  . Not on file.   Social History Main Topics  . Smoking status: Never Smoker   . Smokeless tobacco: Never Used  . Alcohol Use: No  . Drug Use: No  . Sexually Active: Not Currently    Birth Control/ Protection: None   Other Topics Concern  . Not on file   Social History Narrative  . No narrative on file   Current Facility-Administered Medications on File Prior to Encounter  Medication Dose Route Frequency Provider Last Rate Last Dose  . influenza  inactive virus vaccine (FLUZONE/FLUARIX) injection 0.5 mL  0.5 mL Intramuscular Once Reva Bores, MD       Current Outpatient Prescriptions on File Prior to Encounter  Medication Sig Dispense Refill  . acetaminophen  (TYLENOL) 325 MG tablet Take 650 mg by mouth every 6 (six) hours as needed.      Marland Kitchen albuterol (PROVENTIL HFA;VENTOLIN HFA) 108 (90 BASE) MCG/ACT inhaler Inhale 2 puffs into the lungs every 6 (six) hours as needed. For wheezing      . aspirin 81 MG tablet Take 81 mg by mouth daily.      Marland Kitchen EPINEPHrine (EPIPEN) 0.3 mg/0.3 mL DEVI Inject 0.3 mLs (0.3 mg total) into the muscle as needed. Use as directed in the event of a severe allergic reaction  1 Device  3  . insulin aspart (NOVOLOG) 100 UNIT/ML injection Inject 12 Units into the skin 3 (three) times daily before meals.  1 vial  12  . insulin NPH (HUMULIN N) 100 UNIT/ML injection Inject 10 Units into the skin 2 (two) times daily. q am and q hs  10 mL  12  . labetalol (NORMODYNE) 100 MG tablet Take 2 tablets (200 mg total) by mouth 2 (two) times daily.  60 tablet  0  . omeprazole (PRILOSEC) 20 MG capsule Take 20 mg by mouth 2 (two) times daily.  Allergies  Allergen Reactions  . Penicillins Hives  . Shrimp (Shellfish Allergy) Hives and Swelling    ROS: Pertinent items in HPI  OBJECTIVE Blood pressure 157/96, pulse 89, temperature 98.9 F (37.2 C), temperature source Oral, resp. rate 20, height 5\' 9"  (1.753 m), weight 88.055 kg (194 lb 2 oz), last menstrual period 03/21/2012, SpO2 100.00%. Patient Vitals for the past 24 hrs:  BP Temp Temp src Pulse Resp SpO2 Height Weight  06/02/12 0407 157/96 mmHg 98.9 F (37.2 C) Oral 89  20  100 % 5\' 9"  (1.753 m) 88.055 kg (194 lb 2 oz)  BP 150/82  Pulse 85   GENERAL: Well-developed, well-nourished female in no acute distress.  HEENT: Normocephalic HEART: normal rate RESP: normal effort ABDOMEN: Soft, non-tender EXTREMITIES: Nontender, no edema NEURO: Alert and oriented SPECULUM EXAM: NEFG, scant pink discharge noted, cervix clean BIMANUAL: cervix long and closed; uterus 12 week size, no adnexal tenderness or masses FHR: 170 by doppler.   LAB RESULTS  IMAGING Pos cardiac activity and FM seen  on informal BS [place]. Additionally 1 demised triplet fetus and collapsed GS seen.   MAU COURSE  ASSESSMENT 1. First trimester bleeding   2. Chronic hypertension complicating or reason for care during pregnancy    PLAN Discharge home Per consult w/ Dr. Macon Large increase Labetalol to 400 BID. Pelvic rest x 1 week. Bleeding precautions. Follow-up Information    Follow up with cardiologist . In 1 week. (as scheduled)       Follow up with [facility]. On 06/15/2012.   Contact information:   [address] [place] [address] [phone]      Follow up with [facility] [place] [facility]. (As needed if symptoms worsen)    Contact information:   [address] [id number] mc [place] [address] [phone]          Medication List     As of 06/06/2012  2:48 PM    TAKE these medications         acetaminophen 325 MG tablet   Commonly known as: TYLENOL   Take 650 mg by mouth every 6 (six) hours as needed.      albuterol 108 (90 BASE) MCG/ACT inhaler   Commonly known as: PROVENTIL HFA;VENTOLIN HFA   Inhale 2 puffs into the lungs every 6 (six) hours as needed. For wheezing      aspirin 81 MG tablet   Take 81 mg by mouth daily.      EPINEPHrine 0.3 mg/0.3 mL Devi   Commonly known as: EPI-PEN   Inject 0.3 mLs (0.3 mg total) into the muscle as needed. Use as directed in the event of a severe allergic reaction      insulin aspart 100 UNIT/ML injection   Commonly known as: novoLOG   Inject 12 Units into the skin 3 (three) times daily before meals.      insulin NPH 100 UNIT/ML injection   Commonly known as: HUMULIN N,NOVOLIN N   Inject 10 Units into the skin 2 (two) times daily. q am and q hs      labetalol 100 MG tablet   Commonly known as: NORMODYNE   Take 4 tablets (400 mg total) by mouth 2 (two) times daily.      omeprazole 20 MG capsule   Commonly known as: PRILOSEC   Take 20 mg by mouth 2  (two) times daily.         Dorathy Kinsman, CNM  06/02/2012  4:46 AM

## 2012-06-02 NOTE — MAU Note (Signed)
Pt states she vomited and thinks she urinated at the same time and there was tinges of pinkish blood in the fluid that came out-when she gave the sample just now of urine-there was pink on the tissue

## 2012-06-03 ENCOUNTER — Other Ambulatory Visit (HOSPITAL_COMMUNITY): Payer: Self-pay | Admitting: Advanced Practice Midwife

## 2012-06-04 ENCOUNTER — Encounter (HOSPITAL_COMMUNITY): Payer: Self-pay | Admitting: Family Medicine

## 2012-06-04 ENCOUNTER — Encounter: Payer: Self-pay | Admitting: Family Medicine

## 2012-06-04 DIAGNOSIS — B951 Streptococcus, group B, as the cause of diseases classified elsewhere: Secondary | ICD-10-CM | POA: Insufficient documentation

## 2012-06-04 DIAGNOSIS — R87612 Low grade squamous intraepithelial lesion on cytologic smear of cervix (LGSIL): Secondary | ICD-10-CM | POA: Insufficient documentation

## 2012-06-04 DIAGNOSIS — O2340 Unspecified infection of urinary tract in pregnancy, unspecified trimester: Secondary | ICD-10-CM | POA: Insufficient documentation

## 2012-06-04 HISTORY — DX: Streptococcus, group b, as the cause of diseases classified elsewhere: B95.1

## 2012-06-04 LAB — CREATININE CLEARANCE, URINE, 24 HOUR
Creatinine Clearance: 115 mL/min (ref 75–115)
Creatinine, 24H Ur: 1027 mg/d (ref 700–1800)
Creatinine, Urine: 186.7 mg/dL

## 2012-06-04 LAB — COMPREHENSIVE METABOLIC PANEL
Alkaline Phosphatase: 61 U/L (ref 39–117)
BUN: 8 mg/dL (ref 6–23)
Glucose, Bld: 78 mg/dL (ref 70–99)
Total Bilirubin: 0.3 mg/dL (ref 0.3–1.2)

## 2012-06-04 LAB — PROTEIN, URINE, 24 HOUR
Protein, 24H Urine: 72 mg/d (ref 50–100)
Protein, Urine: 13 mg/dL

## 2012-06-04 NOTE — Progress Notes (Signed)
[facility] called to add sensitivities to urine culture for + GBS 2/2 PCN allergic. Loney Loh will run and release sensitivities when available.

## 2012-06-07 ENCOUNTER — Inpatient Hospital Stay (HOSPITAL_COMMUNITY)
Admission: AD | Admit: 2012-06-07 | Discharge: 2012-06-07 | Disposition: A | Discharge status: Home / Self Care | Payer: Medicaid Other | Source: Ambulatory Visit | Attending: Obstetrics & Gynecology | Admitting: Obstetrics & Gynecology

## 2012-06-07 ENCOUNTER — Encounter (HOSPITAL_COMMUNITY): Payer: Self-pay | Admitting: *Deleted

## 2012-06-07 DIAGNOSIS — O30109 Triplet pregnancy, unspecified number of placenta and unspecified number of amniotic sacs, unspecified trimester: Secondary | ICD-10-CM | POA: Insufficient documentation

## 2012-06-07 DIAGNOSIS — O24919 Unspecified diabetes mellitus in pregnancy, unspecified trimester: Secondary | ICD-10-CM

## 2012-06-07 DIAGNOSIS — O09529 Supervision of elderly multigravida, unspecified trimester: Secondary | ICD-10-CM

## 2012-06-07 DIAGNOSIS — O209 Hemorrhage in early pregnancy, unspecified: Secondary | ICD-10-CM

## 2012-06-07 DIAGNOSIS — O2340 Unspecified infection of urinary tract in pregnancy, unspecified trimester: Secondary | ICD-10-CM

## 2012-06-07 DIAGNOSIS — B951 Streptococcus, group B, as the cause of diseases classified elsewhere: Secondary | ICD-10-CM

## 2012-06-07 DIAGNOSIS — O10019 Pre-existing essential hypertension complicating pregnancy, unspecified trimester: Secondary | ICD-10-CM

## 2012-06-07 DIAGNOSIS — O2390 Unspecified genitourinary tract infection in pregnancy, unspecified trimester: Secondary | ICD-10-CM

## 2012-06-07 DIAGNOSIS — O0990 Supervision of high risk pregnancy, unspecified, unspecified trimester: Secondary | ICD-10-CM

## 2012-06-07 DIAGNOSIS — R109 Unspecified abdominal pain: Secondary | ICD-10-CM | POA: Insufficient documentation

## 2012-06-07 DIAGNOSIS — N390 Urinary tract infection, site not specified: Secondary | ICD-10-CM

## 2012-06-07 DIAGNOSIS — IMO0002 Reserved for concepts with insufficient information to code with codable children: Secondary | ICD-10-CM | POA: Insufficient documentation

## 2012-06-07 NOTE — MAU Note (Signed)
Patient awoke about 0200 to urinate and when she wiped, noticed brownish discharge on toilet paper. This is a known triplet pregnancy with 2 fetuses demised.

## 2012-06-07 NOTE — MAU Provider Note (Signed)
Attestation of Attending Supervision of Advanced Practitioner (CNM/NP): Evaluation and management procedures were performed by the Advanced Practitioner under my supervision and collaboration.  I have reviewed the Advanced Practitioner's note and chart, and I agree with the management and plan.  HARRAWAY-SMITH, Arbor Cohen 8:30 AM     

## 2012-06-07 NOTE — MAU Provider Note (Signed)
History     CSN: [id number]  Arrival date and time: 06/07/12 7829   None     Chief Complaint  Patient presents with  . Vaginal Bleeding  . Abdominal Cramping   HPI This is a 41 y.o. female at [redacted]w[redacted]d who presents via EMS with c/o brown vaginal discharge and pelvic cramping. Was seen several days ago for similar symptoms, with pink spotting. Recently found to have a triplet pregnancy with 2 of them demised.  Complications include Type 2 diabetes (insulin dependent) and chronic hypertension.  OB History    Grav Para Term Preterm Abortions TAB SAB Ect Mult Living   3 2 2       2       Past Medical History  Diagnosis Date  . Diabetes mellitus   . Hypertension   . Bronchitis   . Arthritis   . GERD (gastroesophageal reflux disease)   . Asthma   . Migraines 02/21/2012  . Anemia 02/21/2012  . HTN (hypertension) 02/21/2012  . Diabetes mellitus 02/21/2012    Past Surgical History  Procedure Date  . Cholecystectomy 09/27/2001  . No past surgeries     Family History  Problem Relation Age of Onset  . Coronary artery disease Father   . Cancer Paternal Uncle     throat  . Diabetes Maternal Grandmother   . Cancer Maternal Grandfather     prostate  . Cancer Paternal Grandfather     lung    History  Substance Use Topics  . Smoking status: Never Smoker   . Smokeless tobacco: Never Used  . Alcohol Use: No    Allergies:  Allergies  Allergen Reactions  . Penicillins Hives  . Shrimp (Shellfish Allergy) Hives and Swelling    Prescriptions prior to admission  Medication Sig Dispense Refill  . acetaminophen (TYLENOL) 325 MG tablet Take 650 mg by mouth every 6 (six) hours as needed.      Marland Kitchen albuterol (PROVENTIL HFA;VENTOLIN HFA) 108 (90 BASE) MCG/ACT inhaler Inhale 2 puffs into the lungs every 6 (six) hours as needed. For wheezing      . aspirin 81 MG tablet Take 81 mg by mouth daily.      Marland Kitchen EPINEPHrine (EPIPEN) 0.3 mg/0.3 mL DEVI Inject 0.3 mLs (0.3 mg total) into the muscle  as needed. Use as directed in the event of a severe allergic reaction  1 Device  3  . insulin aspart (NOVOLOG) 100 UNIT/ML injection Inject 12 Units into the skin 3 (three) times daily before meals.  1 vial  12  . insulin NPH (HUMULIN N) 100 UNIT/ML injection Inject 10 Units into the skin 2 (two) times daily. q am and q hs  10 mL  12  . labetalol (NORMODYNE) 100 MG tablet Take 4 tablets (400 mg total) by mouth 2 (two) times daily.  60 tablet  0  . labetalol (NORMODYNE) 100 MG tablet TAKE 2 TABLETS (200 MG TOTAL) BY MOUTH 2 (TWO) TIMES DAILY.  60 tablet  0  . omeprazole (PRILOSEC) 20 MG capsule Take 20 mg by mouth 2 (two) times daily.        ROS See HPI  Physical Exam   Blood pressure 112/68, pulse 96, temperature 97.4 F (36.3 C), temperature source Oral, resp. rate 20, height 5\' 9"  (1.753 m), weight 191 lb (86.637 kg), last menstrual period 03/21/2012.  Physical Exam  Constitutional: She is oriented to person, place, and time. She appears well-developed and well-nourished. No distress.  Cardiovascular: Normal rate.  Respiratory: Effort normal.  GI: Soft. She exhibits no distension and no mass. There is no tenderness. There is no rebound and no guarding.  Genitourinary: Uterus normal. Vaginal discharge (scant light brown discharge, cervix long and closed) found.  Musculoskeletal: Normal range of motion.  Neurological: She is alert and oriented to person, place, and time.  Skin: Skin is warm and dry.  Psychiatric: She has a normal mood and affect.   [place] FHR 160  MAU Course  Procedures  Assessment and Plan  A:  Triplet pregnancy with single live fetus at [redacted]w[redacted]d      Old blood in vault, s/p first trimester bleeding most likely from known subchorionic hemorrhage   P:  Discharge       Reassurance given       SAB precautions       Reviewed goals for blood sugar measurments.       Has [place] scheduled for this week      Follow up in HR clinic       [facility] 06/07/2012, 4:15  AM

## 2012-06-08 ENCOUNTER — Encounter: Payer: Self-pay | Admitting: Family Medicine

## 2012-06-08 NOTE — MAU Provider Note (Signed)
Attestation of Attending Supervision of Advanced Practitioner (CNM/NP): Evaluation and management procedures were performed by the Advanced Practitioner under my supervision and collaboration.  I have reviewed the Advanced Practitioner's note and chart, and I agree with the management and plan.  Tykerria Mccubbins, MD, FACOG Attending Obstetrician & Gynecologist Faculty Practice, [facility]  

## 2012-06-09 ENCOUNTER — Ambulatory Visit (HOSPITAL_COMMUNITY)
Admission: RE | Admit: 2012-06-09 | Discharge: 2012-06-09 | Disposition: A | Discharge status: Home / Self Care | Payer: Medicaid Other | Source: Ambulatory Visit | Attending: Family Medicine | Admitting: Family Medicine

## 2012-06-09 ENCOUNTER — Other Ambulatory Visit: Payer: Self-pay

## 2012-06-09 VITALS — BP 122/78 | HR 104 | Wt 192.0 lb

## 2012-06-09 DIAGNOSIS — O24919 Unspecified diabetes mellitus in pregnancy, unspecified trimester: Secondary | ICD-10-CM

## 2012-06-09 DIAGNOSIS — O2340 Unspecified infection of urinary tract in pregnancy, unspecified trimester: Secondary | ICD-10-CM

## 2012-06-09 DIAGNOSIS — O30109 Triplet pregnancy, unspecified number of placenta and unspecified number of amniotic sacs, unspecified trimester: Secondary | ICD-10-CM | POA: Insufficient documentation

## 2012-06-09 DIAGNOSIS — O09529 Supervision of elderly multigravida, unspecified trimester: Secondary | ICD-10-CM

## 2012-06-09 DIAGNOSIS — O10019 Pre-existing essential hypertension complicating pregnancy, unspecified trimester: Secondary | ICD-10-CM

## 2012-06-09 DIAGNOSIS — I10 Essential (primary) hypertension: Secondary | ICD-10-CM

## 2012-06-09 DIAGNOSIS — O3510X0 Maternal care for (suspected) chromosomal abnormality in fetus, unspecified, not applicable or unspecified: Secondary | ICD-10-CM | POA: Insufficient documentation

## 2012-06-09 DIAGNOSIS — Z3682 Encounter for antenatal screening for nuchal translucency: Secondary | ICD-10-CM

## 2012-06-09 DIAGNOSIS — Z3689 Encounter for other specified antenatal screening: Secondary | ICD-10-CM | POA: Insufficient documentation

## 2012-06-09 DIAGNOSIS — B951 Streptococcus, group B, as the cause of diseases classified elsewhere: Secondary | ICD-10-CM

## 2012-06-09 DIAGNOSIS — O351XX0 Maternal care for (suspected) chromosomal abnormality in fetus, not applicable or unspecified: Secondary | ICD-10-CM | POA: Insufficient documentation

## 2012-06-09 DIAGNOSIS — O3410 Maternal care for benign tumor of corpus uteri, unspecified trimester: Secondary | ICD-10-CM | POA: Insufficient documentation

## 2012-06-09 DIAGNOSIS — O0990 Supervision of high risk pregnancy, unspecified, unspecified trimester: Secondary | ICD-10-CM

## 2012-06-09 NOTE — Progress Notes (Signed)
Genetic Counseling  High-Risk Gestation Note  Appointment Date:  06/09/2012 Referred By: Reva Bores, MD Date of Birth:  [birth year]    Pregnancy History: [id number] Estimated Date of Delivery: 12/20/12 Estimated Gestational Age: [redacted]w[redacted]d Attending: Alpha Gula, MD     Ms. Carla Brooks was seen for genetic counseling because of a maternal age of 41 y.o..  She was accompanied by her mother.   They were counseled regarding maternal age and the association with risk for chromosome conditions due to nondisjunction with aging of the ova.   We reviewed chromosomes, nondisjunction, and the associated 1 in 47 risk for fetal aneuploidy related to a maternal age of 41 y.o. at [redacted]w[redacted]d gestation.  They were counseled that the risk for aneuploidy decreases as gestational age increases, accounting for those pregnancies which spontaneously abort.  We specifically discussed Down syndrome (trisomy 71), trisomies 43 and 40, and sex chromosome aneuploidies (47,XXX and 47,XXY) including the common features and prognoses of each.   We reviewed available screening options including First screen, Quad screen, noninvasive prenatal testing (NIPT), and detailed ultrasound. She understands that screening tests are used to modify a patient's a priori risk for aneuploidy, typically based on age.  This estimate provides a pregnancy specific risk assessment.  Specifically, we discussed that NIPT analyzes cell free fetal DNA found in the maternal circulation. This test is not diagnostic for chromosome conditions, but can provide information regarding the presence or absence of extra fetal DNA for chromosomes 13, 18, 21, X, and Y, and missing fetal DNA for chromosome X and Y (Turner syndrome). Thus, it would not identify or rule out all genetic conditions. The reported detection rate is greater than 99% for Trisomy 21, greater than 98% for Trisomy 18, and is approximately 80% (8 out of 10) for Trisomy 13. The false positive rate is  reported to be less than 0.1% for any of these conditions. We reviewed that data are limited regarding the potential effects of earlier demise of one or more fetuses in a multiple gestation pregnancy. However, there is the potential that the previous loss of two fetuses in the current pregnancy may increase the false positive rate of NIPT.  In addition, we discussed that ~50-80% of fetuses with Down syndrome and up to 90-95% of fetuses with trisomy 18/13, when well visualized, have detectable anomalies or soft markers by detailed ultrasound (~18+ weeks gestation).   They were also counseled regarding diagnostic testing via [facility] or amniocentesis.  We reviewed the approximate 1 in 300-500 risk for complications for amniocentesis, including spontaneous pregnancy loss. We discussed the risks, limitations, and benefits of each screening and testing option. After consideration of all the options, and a clear understanding of the newness and limitations of NIPT, she elected to proceed with cell free fetal DNA testing ([facility]) at the time of today's visit. Those results will be available in 8-10 days.  A complete ultrasound was performed today to obtain nuchal translucency measurement.  The ultrasound report will be sent under separate cover. She understands that ultrasound cannot rule out all birth defects or genetic syndromes. The patient was advised of this limitation and states she still does not want diagnostic testing at this time or in the future given the associated risk for complications.  Both family histories were reviewed and found to be contributory for sickle cell trait or sickle cell disease for the father of the pregnancy. The patient is no longer in a relationship with the father of the pregnancy and has  limited information regarding his medical history. She reported that he likely had sickle cell trait. However, he was hospitalized once and received a blood transfusion. Ms. Carla Brooks has no  family history of sickle cell disease or sickle cell trait, and consanguinity to the father of the pregnancy was denied. She is unaware of sickle cell screening being performed for her in the past.   We discussed that Sickle Cell anemia (SCA) is a hemoglobinopathy in which there is an inherited structural abnormality in one of the globin chains, specifically the beta globin chain.  It is characterized by episodes of vaso-occlusive crisis and chronic anemia due to the tendency of red blood cells to become deformed under conditions of decreased oxygen tension.  The basic defect in SCA involves the change of a single amino acid altering the configuration of the hemoglobin molecule causing the cells to sickle and to obstruct blood flow in small vessels.  Ischemia of tissues and organs results.  Increased cell fragility with increased phagocytosis and splenic sequestration of the fragile cells produces anemia.  SCA (Hgb SS) is inherited as an autosomal recessive condition.  The carrier state is Hgb AS.  We reviewed the autosomal recessive inheritance, and that a pregnancy is at risk to inherit the condition when both parents are carriers of a hemoglobin variant.  We reviewed that early diagnosis of SCA is facilitated by newborn screening before the onset of symptoms.  Clinical manifestations usually present in the first or second year of life.  Ms. Carla Brooks was provided with written information regarding sickle cell anemia (SCA) including the carrier frequency and incidence in the African-American population, the availability of carrier testing and prenatal diagnosis if indicated.  In addition, we discussed that hemoglobinopathies are routinely screened for as part of the [place] newborn screening panel.  Prior to carrier screening for Ms. Carla Brooks, the risk for sickle cell disease in the current pregnancy is estimate to range from 1 in 48 to 1 in 24, depending upon whether or not the father of the pregnancy has  sickle cell trait or disease and assuming the general population carrier frequency for Ms. Carla Brooks. After careful consideration, Ms. Carla Brooks declined hemoglobin electrophoresis at the time of today's visit given that sickle cell anemia is part of newborn screening in [place].   The patient was not familiar with the father of the baby's family history.  We, therefore, cannot comment on how his history might contribute to the overall chance for the baby to have a birth defect.  Without further information regarding the provided family history, an accurate genetic risk cannot be calculated. Further genetic counseling is warranted if more information is obtained.  Ms. Carla Brooks denied exposure to environmental toxins or chemical agents. She denied the use of alcohol, tobacco or street drugs. She denied significant viral illnesses during the course of her pregnancy. Her medical and surgical histories were contributory for hypertension and diabetes mellitus, for which she is currently treated with labetalol, metformin, and insulin. She reported that her primary OB is managing her during pregnancy regarding this history.    I counseled Ms. Carla Brooks regarding the above risks and available options.  The approximate face-to-face time with the genetic counselor was 40 minutes.  Quinn Plowman, MS,  Certified Genetic Counselor 06/09/2012

## 2012-06-09 NOTE — Progress Notes (Signed)
Carla Brooks  was seen today for an ultrasound appointment.  See full report in [facility].  Alpha Gula, MD   Tri/Tri triplet gestation with best dates of 12 2/7 weeks - s/p demise of baby A and B Baby C - CRL consistent with gestationa age Multiple uterine fibroids noted as above NT of 1.1 mm noted.  A nasal bone was visualized  After counseling, the patient elected to undergo cell free fetal DNA testing ([facility])  Recommend follow up in 6 weeks for detailed anatomy ultrasound. Fetal echo in 8 weeks (scheduled) due to history of diabetes.

## 2012-06-09 NOTE — Addendum Note (Signed)
Encounter addended by: Candis Shine, MD on: 06/09/2012 12:42 PM<BR>     Documentation filed: Orders

## 2012-06-10 ENCOUNTER — Other Ambulatory Visit (HOSPITAL_COMMUNITY): Payer: Medicaid Other

## 2012-06-15 ENCOUNTER — Encounter: Payer: Medicaid Other | Admitting: Family Medicine

## 2012-06-15 ENCOUNTER — Ambulatory Visit (INDEPENDENT_AMBULATORY_CARE_PROVIDER_SITE_OTHER): Payer: Medicaid Other | Admitting: Family Medicine

## 2012-06-15 ENCOUNTER — Encounter: Payer: Self-pay | Admitting: Family Medicine

## 2012-06-15 ENCOUNTER — Other Ambulatory Visit (HOSPITAL_COMMUNITY)
Admission: RE | Admit: 2012-06-15 | Discharge: 2012-06-15 | Disposition: A | Discharge status: Home / Self Care | Payer: Medicaid Other | Source: Ambulatory Visit | Attending: Family Medicine | Admitting: Family Medicine

## 2012-06-15 VITALS — BP 141/95 | Temp 97.7°F | Wt 196.0 lb

## 2012-06-15 DIAGNOSIS — IMO0002 Reserved for concepts with insufficient information to code with codable children: Secondary | ICD-10-CM

## 2012-06-15 DIAGNOSIS — R87619 Unspecified abnormal cytological findings in specimens from cervix uteri: Secondary | ICD-10-CM | POA: Insufficient documentation

## 2012-06-15 DIAGNOSIS — O24919 Unspecified diabetes mellitus in pregnancy, unspecified trimester: Secondary | ICD-10-CM

## 2012-06-15 DIAGNOSIS — R6889 Other general symptoms and signs: Secondary | ICD-10-CM

## 2012-06-15 DIAGNOSIS — R87612 Low grade squamous intraepithelial lesion on cytologic smear of cervix (LGSIL): Secondary | ICD-10-CM

## 2012-06-15 LAB — POCT URINALYSIS DIP (DEVICE)
Glucose, UA: NEGATIVE mg/dL
Nitrite: NEGATIVE
Urobilinogen, UA: 0.2 mg/dL (ref 0.0–1.0)

## 2012-06-15 MED ORDER — LABETALOL HCL 100 MG PO TABS: 300.0000 mg | ORAL_TABLET | Freq: Two times a day (BID) | ORAL | Status: DC

## 2012-06-15 MED ORDER — LABETALOL HCL 100 MG PO TABS: 400.0000 mg | ORAL_TABLET | Freq: Two times a day (BID) | ORAL | Status: DC

## 2012-06-15 NOTE — Progress Notes (Signed)
Pulse- 98 

## 2012-06-15 NOTE — Patient Instructions (Addendum)
Colposcopy Colposcopy is a procedure that uses a special lighted microscope (colposcope). It examines your cervix and vagina, or the area around the outside of the vagina, for signs of disease or abnormalities in the cells. You may be sent to a specialist (gynecologist) to do the colposcopy. A biopsy (tissue sample) may be collected during a colposcopy, if the caregiver finds any unusual cells. The biopsy is sent to the lab for further testing, and the results are reported back to your caregiver. A WOMAN MAY NEED THIS PROCEDURE IF:  She has had an abnormal pap smear (taking cells from the cervix for testing).  She has a sore on her cervix, and a Pap test was normal.  The Pap test suggests human papilloma virus (HPV). This virus can cause genital warts and is linked to the development of cervical cancer.  She has genital warts on the cervix, or in or around the outside of the vagina.  Her mother took the drug DES while pregnant.  She has painful intercourse.  She has vaginal bleeding, especially after sexual intercourse.  There is a need to evaluate the results of previous treatment. BEFORE THE PROCEDURE   Colposcopy is done when you are not having a menstrual period.  For 24 hours before the colposcopy, do not:  Douche.  Use tampons.  Use medicines, creams, or suppositories in the vagina.  Have sexual intercourse. PROCEDURE   A colposcopy is done while a woman is lying on her back with her feet in foot rests (stirrups).  A speculum is placed inside the vagina to keep it open and to allow the caregiver to see the cervix. This is the same instrument used to do a pap smear.  The colposcope is placed outside the vagina. It is used to magnify and examine the cervix, vagina, and the area around the outside of the vagina.  A small amount of liquid solution is placed on the area that is to be viewed. This solution is placed on with a cotton applicator. This solution makes it easier to  see the abnormal cells.  Your caregiver will suck out mucus and cells from the canal of the cervix.  Small pieces of tissue for biopsy may be taken at the same time. You may feel mild pain or discomfort when this is done.  Your caregiver will record the location of the abnormal areas and send the tissue samples to a lab for analysis.  If your caregiver biopsies the vagina or outside of the vagina, a local anesthetic (novocaine) is usually given. AFTER THE PROCEDURE   You may have some cramping that often goes away in a few minutes. You may have some soreness for a couple of days.  You may take over-the-counter pain medicine as advised by your caregiver. Do not take aspirin because it can cause bleeding.  Lie down for a few minutes if you feel lightheaded.  You may have some bleeding or dark discharge that should stop in a few days.  You may need to wear a sanitary pad for a few days. HOME CARE INSTRUCTIONS   Avoid sex, douching, and using tampons for a week or as directed.  Only take medicine as directed by your caregiver.  Continue to take birth control pills, if you are on them.  Not all test results are available during your visit. If your test results are not back during the visit, make an appointment with your caregiver to find out the results. Do not assume everything is   normal if you have not heard from your caregiver or the medical facility. It is important for you to follow up on all of your test results.  Follow your caregiver's advice regarding medicines, activity, follow-up visits, and follow-up Pap tests. SEEK MEDICAL CARE IF:   You develop a rash.  You have problems with your medicine. SEEK IMMEDIATE MEDICAL CARE IF:  You are bleeding heavily or are passing blood clots.  You develop a fever over 102 F (38.9 C), with or without chills.  You have abnormal vaginal discharge.  You are having cramps that do not go away after taking your pain medicine.  You  feel lightheaded, dizzy, or faint.  You develop stomach pain. Document Released: 11/09/2002 Document Revised: 11/11/2011 Document Reviewed: 06/22/2009 [facility] Patient Information 2013 [place], [place].  Pregnancy - Second Trimester The second trimester of pregnancy (3 to 6 months) is a period of rapid growth for you and your baby. At the end of the sixth month, your baby is about 9 inches long and weighs 1 1/2 pounds. You will begin to feel the baby move between 18 and 20 weeks of the pregnancy. This is called quickening. Weight gain is faster. A clear fluid (colostrum) may leak out of your breasts. You may feel small contractions of the womb (uterus). This is known as false labor or Braxton-Hicks contractions. This is like a practice for labor when the baby is ready to be born. Usually, the problems with morning sickness have usually passed by the end of your first trimester. Some women develop small dark blotches (called cholasma, mask of pregnancy) on their face that usually goes away after the baby is born. Exposure to the sun makes the blotches worse. Acne may also develop in some pregnant women and pregnant women who have acne, may find that it goes away. PRENATAL EXAMS  Blood work may continue to be done during prenatal exams. These tests are done to check on your health and the probable health of your baby. Blood work is used to follow your blood levels (hemoglobin). Anemia (low hemoglobin) is common during pregnancy. Iron and vitamins are given to help prevent this. You will also be checked for diabetes between 24 and 28 weeks of the pregnancy. Some of the previous blood tests may be repeated.  The size of the uterus is measured during each visit. This is to make sure that the baby is continuing to grow properly according to the dates of the pregnancy.  Your blood pressure is checked every prenatal visit. This is to make sure you are not getting toxemia.  Your urine is checked to make sure  you do not have an infection, diabetes or protein in the urine.  Your weight is checked often to make sure gains are happening at the suggested rate. This is to ensure that both you and your baby are growing normally.  Sometimes, an ultrasound is performed to confirm the proper growth and development of the baby. This is a test which bounces harmless sound waves off the baby so your caregiver can more accurately determine due dates. Sometimes, a specialized test is done on the amniotic fluid surrounding the baby. This test is called an amniocentesis. The amniotic fluid is obtained by sticking a needle into the belly (abdomen). This is done to check the chromosomes in instances where there is a concern about possible genetic problems with the baby. It is also sometimes done near the end of pregnancy if an early delivery is required. In  this case, it is done to help make sure the baby's lungs are mature enough for the baby to live outside of the womb. CHANGES OCCURING IN THE SECOND TRIMESTER OF PREGNANCY Your body goes through many changes during pregnancy. They vary from person to person. Talk to your caregiver about changes you notice that you are concerned about.  During the second trimester, you will likely have an increase in your appetite. It is normal to have cravings for certain foods. This varies from person to person and pregnancy to pregnancy.  Your lower abdomen will begin to bulge.  You may have to urinate more often because the uterus and baby are pressing on your bladder. It is also common to get more bladder infections during pregnancy (pain with urination). You can help this by drinking lots of fluids and emptying your bladder before and after intercourse.  You may begin to get stretch marks on your hips, abdomen, and breasts. These are normal changes in the body during pregnancy. There are no exercises or medications to take that prevent this change.  You may begin to develop swollen  and bulging veins (varicose veins) in your legs. Wearing support hose, elevating your feet for 15 minutes, 3 to 4 times a day and limiting salt in your diet helps lessen the problem.  Heartburn may develop as the uterus grows and pushes up against the stomach. Antacids recommended by your caregiver helps with this problem. Also, eating smaller meals 4 to 5 times a day helps.  Constipation can be treated with a stool softener or adding bulk to your diet. Drinking lots of fluids, vegetables, fruits, and whole grains are helpful.  Exercising is also helpful. If you have been very active up until your pregnancy, most of these activities can be continued during your pregnancy. If you have been less active, it is helpful to start an exercise program such as walking.  Hemorrhoids (varicose veins in the rectum) may develop at the end of the second trimester. Warm sitz baths and hemorrhoid cream recommended by your caregiver helps hemorrhoid problems.  Backaches may develop during this time of your pregnancy. Avoid heavy lifting, wear low heal shoes and practice good posture to help with backache problems.  Some pregnant women develop tingling and numbness of their hand and fingers because of swelling and tightening of ligaments in the wrist (carpel tunnel syndrome). This goes away after the baby is born.  As your breasts enlarge, you may have to get a bigger bra. Get a comfortable, cotton, support bra. Do not get a nursing bra until the last month of the pregnancy if you will be nursing the baby.  You may get a dark line from your belly button to the pubic area called the linea nigra.  You may develop rosy cheeks because of increase blood flow to the face.  You may develop spider looking lines of the face, neck, arms and chest. These go away after the baby is born. HOME CARE INSTRUCTIONS   It is extremely important to avoid all smoking, herbs, alcohol, and unprescribed drugs during your pregnancy.  These chemicals affect the formation and growth of the baby. Avoid these chemicals throughout the pregnancy to ensure the delivery of a healthy infant.  Most of your home care instructions are the same as suggested for the first trimester of your pregnancy. Keep your caregiver's appointments. Follow your caregiver's instructions regarding medication use, exercise and diet.  During pregnancy, you are providing food for you and your  baby. Continue to eat regular, well-balanced meals. Choose foods such as meat, fish, milk and other low fat dairy products, vegetables, fruits, and whole-grain breads and cereals. Your caregiver will tell you of the ideal weight gain.  A physical sexual relationship may be continued up until near the end of pregnancy if there are no other problems. Problems could include early (premature) leaking of amniotic fluid from the membranes, vaginal bleeding, abdominal pain, or other medical or pregnancy problems.  Exercise regularly if there are no restrictions. Check with your caregiver if you are unsure of the safety of some of your exercises. The greatest weight gain will occur in the last 2 trimesters of pregnancy. Exercise will help you:  Control your weight.  Get you in shape for labor and delivery.  Lose weight after you have the baby.  Wear a good support or jogging bra for breast tenderness during pregnancy. This may help if worn during sleep. Pads or tissues may be used in the bra if you are leaking colostrum.  Do not use hot tubs, steam rooms or saunas throughout the pregnancy.  Wear your seat belt at all times when driving. This protects you and your baby if you are in an accident.  Avoid raw meat, uncooked cheese, cat litter boxes and soil used by cats. These carry germs that can cause birth defects in the baby.  The second trimester is also a good time to visit your dentist for your dental health if this has not been done yet. Getting your teeth cleaned is  OK. Use a soft toothbrush. Brush gently during pregnancy.  It is easier to loose urine during pregnancy. Tightening up and strengthening the pelvic muscles will help with this problem. Practice stopping your urination while you are going to the bathroom. These are the same muscles you need to strengthen. It is also the muscles you would use as if you were trying to stop from passing gas. You can practice tightening these muscles up 10 times a set and repeating this about 3 times per day. Once you know what muscles to tighten up, do not perform these exercises during urination. It is more likely to contribute to an infection by backing up the urine.  Ask for help if you have financial, counseling or nutritional needs during pregnancy. Your caregiver will be able to offer counseling for these needs as well as refer you for other special needs.  Your skin may become oily. If so, wash your face with mild soap, use non-greasy moisturizer and oil or cream based makeup. MEDICATIONS AND DRUG USE IN PREGNANCY  Take prenatal vitamins as directed. The vitamin should contain 1 milligram of folic acid. Keep all vitamins out of reach of children. Only a couple vitamins or tablets containing iron may be fatal to a baby or young child when ingested.  Avoid use of all medications, including herbs, over-the-counter medications, not prescribed or suggested by your caregiver. Only take over-the-counter or prescription medicines for pain, discomfort, or fever as directed by your caregiver. Do not use aspirin.  Let your caregiver also know about herbs you may be using.  Alcohol is related to a number of birth defects. This includes fetal alcohol syndrome. All alcohol, in any form, should be avoided completely. Smoking will cause low birth rate and premature babies.  Street or illegal drugs are very harmful to the baby. They are absolutely forbidden. A baby born to an addicted mother will be addicted at birth. The baby  will  go through the same withdrawal an adult does. SEEK MEDICAL CARE IF:  You have any concerns or worries during your pregnancy. It is better to call with your questions if you feel they cannot wait, rather than worry about them. SEEK IMMEDIATE MEDICAL CARE IF:   An unexplained oral temperature above 102 F (38.9 C) develops, or as your caregiver suggests.  You have leaking of fluid from the vagina (birth canal). If leaking membranes are suspected, take your temperature and tell your caregiver of this when you call.  There is vaginal spotting, bleeding, or passing clots. Tell your caregiver of the amount and how many pads are used. Light spotting in pregnancy is common, especially following intercourse.  You develop a bad smelling vaginal discharge with a change in the color from clear to white.  You continue to feel sick to your stomach (nauseated) and have no relief from remedies suggested. You vomit blood or coffee ground-like materials.  You lose more than 2 pounds of weight or gain more than 2 pounds of weight over 1 week, or as suggested by your caregiver.  You notice swelling of your face, hands, feet, or legs.  You get exposed to [place] measles and have never had them.  You are exposed to fifth disease or chickenpox.  You develop belly (abdominal) pain. Round ligament discomfort is a common non-cancerous (benign) cause of abdominal pain in pregnancy. Your caregiver still must evaluate you.  You develop a bad headache that does not go away.  You develop fever, diarrhea, pain with urination, or shortness of breath.  You develop visual problems, blurry, or double vision.  You fall or are in a car accident or any kind of trauma.  There is mental or physical violence at home. Document Released: 08/13/2001 Document Revised: 11/11/2011 Document Reviewed: 02/15/2009 [facility] Patient Information 2013 [place], [place].  Breastfeeding Deciding to breastfeed is one of the best  choices you can make for you and your baby. The information that follows gives a brief overview of the benefits of breastfeeding as well as common topics surrounding breastfeeding. BENEFITS OF BREASTFEEDING For the baby  The first milk (colostrum) helps the baby's digestive system function better.   There are antibodies in the mother's milk that help the baby fight off infections.   The baby has a lower incidence of asthma, allergies, and sudden infant death syndrome (SIDS).   The nutrients in breast milk are better for the baby than infant formulas, and breast milk helps the baby's brain grow better.   Babies who breastfeed have less gas, colic, and constipation.  For the mother  Breastfeeding helps develop a very special bond between the mother and her baby.   Breastfeeding is convenient, always available at the correct temperature, and costs nothing.   Breastfeeding burns calories in the mother and helps her lose weight that was gained during pregnancy.   Breastfeeding makes the uterus contract back down to normal size faster and slows bleeding following delivery.   Breastfeeding mothers have a lower risk of developing breast cancer.  BREASTFEEDING FREQUENCY  A healthy, full-term baby may breastfeed as often as every hour or space his or her feedings to every 3 hours.   Watch your baby for signs of hunger. Nurse your baby if he or she shows signs of hunger. How often you nurse will vary from baby to baby.   Nurse as often as the baby requests, or when you feel the need to reduce the fullness of your breasts.  Awaken the baby if it has been 3 4 hours since the last feeding.   Frequent feeding will help the mother make more milk and will help prevent problems, such as sore nipples and engorgement of the breasts.  BABY'S POSITION AT THE BREAST  Whether lying down or sitting, be sure that the baby's tummy is facing your tummy.   Support the breast with 4 fingers  underneath the breast and the thumb above. Make sure your fingers are well away from the nipple and baby's mouth.   Stroke the baby's lips gently with your finger or nipple.   When the baby's mouth is open wide enough, place all of your nipple and as much of the areola as possible into your baby's mouth.   Pull the baby in close so the tip of the nose and the baby's cheeks touch the breast during the feeding.  FEEDINGS AND SUCTION  The length of each feeding varies from baby to baby and from feeding to feeding.   The baby must suck about 2 3 minutes for your milk to get to him or her. This is called a "let down." For this reason, allow the baby to feed on each breast as long as he or she wants. Your baby will end the feeding when he or she has received the right balance of nutrients.   To break the suction, put your finger into the corner of the baby's mouth and slide it between his or her gums before removing your breast from his or her mouth. This will help prevent sore nipples.  HOW TO TELL WHETHER YOUR BABY IS GETTING ENOUGH BREAST MILK. Wondering whether or not your baby is getting enough milk is a common concern among mothers. You can be assured that your baby is getting enough milk if:   Your baby is actively sucking and you hear swallowing.   Your baby seems relaxed and satisfied after a feeding.   Your baby nurses at least 8 12 times in a 24 hour time period. Nurse your baby until he or she unlatches or falls asleep at the first breast (at least 10 20 minutes), then offer the second side.   Your baby is wetting 5 6 disposable diapers (6 8 cloth diapers) in a 24 hour period by 41 11 days of age.   Your baby is having at least 3 4 stools every 24 hours for the first 6 weeks. The stool should be soft and yellow.   Your baby should gain 4 7 ounces per week after he or she is 54 days old.   Your breasts feel softer after nursing.  REDUCING BREAST ENGORGEMENT  In the  first week after your baby is born, you may experience signs of breast engorgement. When breasts are engorged, they feel heavy, warm, full, and may be tender to the touch. You can reduce engorgement if you:   Nurse frequently, every 2 3 hours. Mothers who breastfeed early and often have fewer problems with engorgement.   Place light ice packs on your breasts for 10 20 minutes between feedings. This reduces swelling. Wrap the ice packs in a lightweight towel to protect your skin. Bags of frozen vegetables work well for this purpose.   Take a warm shower or apply warm, moist heat to your breast for 5 10 minutes just before each feeding. This increases circulation and helps the milk flow.   Gently massage your breast before and during the feeding. Using your finger tips, massage  from the chest wall towards your nipple in a circular motion.   Make sure that the baby empties at least one breast at every feeding before switching sides.   Use a breast pump to empty the breasts if your baby is sleepy or not nursing well. You may also want to pump if you are returning to work oryou feel you are getting engorged.   Avoid bottle feeds, pacifiers, or supplemental feedings of water or juice in place of breastfeeding. Breast milk is all the food your baby needs. It is not necessary for your baby to have water or formula. In fact, to help your breasts make more milk, it is best not to give your baby supplemental feedings during the early weeks.   Be sure the baby is latched on and positioned properly while breastfeeding.   Wear a supportive bra, avoiding underwire styles.   Eat a balanced diet with enough fluids.   Rest often, relax, and take your prenatal vitamins to prevent fatigue, stress, and anemia.  If you follow these suggestions, your engorgement should improve in 24 48 hours. If you are still experiencing difficulty, call your lactation consultant or caregiver.  CARING FOR  YOURSELF Take care of your breasts  Bathe or shower daily.   Avoid using soap on your nipples.   Start feedings on your left breast at one feeding and on your right breast at the next feeding.   You will notice an increase in your milk supply 2 5 days after delivery. You may feel some discomfort from engorgement, which makes your breasts very firm and often tender. Engorgement "peaks" out within 24 48 hours. In the meantime, apply warm moist towels to your breasts for 5 10 minutes before feeding. Gentle massage and expression of some milk before feeding will soften your breasts, making it easier for your baby to latch on.   Wear a well-fitting nursing bra, and air dry your nipples for a 3 after each feeding.   Only use cotton bra pads.   Only use pure lanolin on your nipples after nursing. You do not need to wash it off before feeding the baby again. Another option is to express a few drops of breast milk and gently massage it into your nipples.  Take care of yourself  Eat well-balanced meals and nutritious snacks.   Drinking milk, fruit juice, and water to satisfy your thirst (about 8 glasses a day).   Get plenty of rest.  Avoid foods that you notice affect the baby in a bad way.  SEEK MEDICAL CARE IF:   You have difficulty with breastfeeding and need help.   You have a hard, red, sore area on your breast that is accompanied by a fever.   Your baby is too sleepy to eat well or is having trouble sleeping.   Your baby is wetting less than 6 diapers a day, by 38 days of age.   Your baby's skin or white part of his or her eyes is more yellow than it was in the hospital.   You feel depressed.  Document Released: 08/19/2005 Document Revised: 02/18/2012 Document Reviewed: 11/17/2011 [facility] Patient Information 2013 [place], [place].

## 2012-06-15 NOTE — Progress Notes (Signed)
No book today---reports BS between 95-125. To bring book next. 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  Procedure Pt. Is an 41 y.o. 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   Patient given informed consent, signed copy in the chart, time out was performed.  Placed in lithotomy position. Cervix viewed with speculum and colposcope after application of acetic acid.   Colposcopy adequate?  yes Acetowhite lesions?yes throughout SCJ Punctation?yes Mosaicism?  no Abnormal vasculature?  no Biopsies?11, 2 and 5 o'clock ECC?no--pregnant  Patient was given post procedure instructions.  She will return in 2 weeks for results.

## 2012-06-17 ENCOUNTER — Telehealth: Payer: Self-pay | Admitting: *Deleted

## 2012-06-17 ENCOUNTER — Encounter: Payer: Self-pay | Admitting: Family Medicine

## 2012-06-17 DIAGNOSIS — O0990 Supervision of high risk pregnancy, unspecified, unspecified trimester: Secondary | ICD-10-CM

## 2012-06-17 DIAGNOSIS — O10019 Pre-existing essential hypertension complicating pregnancy, unspecified trimester: Secondary | ICD-10-CM

## 2012-06-17 DIAGNOSIS — O24919 Unspecified diabetes mellitus in pregnancy, unspecified trimester: Secondary | ICD-10-CM

## 2012-06-17 DIAGNOSIS — B951 Streptococcus, group B, as the cause of diseases classified elsewhere: Secondary | ICD-10-CM

## 2012-06-17 DIAGNOSIS — O09529 Supervision of elderly multigravida, unspecified trimester: Secondary | ICD-10-CM

## 2012-06-17 NOTE — Telephone Encounter (Signed)
Patient wanted to know if the results from her Harmony Test and Ultrasounds from her Genetic Counseling appt are back.

## 2012-06-17 NOTE — Telephone Encounter (Signed)
Called pt and informed her that her Harmony test results will be received by the [facility] dept.  I provided their tel.number. She voiced understanding.

## 2012-06-18 ENCOUNTER — Telehealth (HOSPITAL_COMMUNITY): Payer: Self-pay | Admitting: MS"

## 2012-06-18 NOTE — Telephone Encounter (Signed)
Called [place] to discuss her Harmony, cell free fetal DNA testing. Testing was offered because of advanced maternal age. We reviewed that these are within normal limits, showing a less than 1 in 10,000 risk for trisomies 21, 18 and 13. We reviewed that this testing identifies > 99% of pregnancies with trisomy 21, >98% of pregnancies with trisomy 47, and >80% with trisomy 78; the false positive rate is <0.1% for all conditions. Testing was also performed for X and Y chromosome analysis. This did not show evidence of aneuploidy for X or Y. It was also consistent with female gender. X and Y analysis has a detection rate of approximately 99%. She understands that this testing does not identify all genetic conditions. All questions were answered to her satisfaction, she was encouraged to call with additional questions or concerns.  Carla Plowman, MS Certified Genetic Counselor 06/18/2012 1:55 PM

## 2012-06-29 ENCOUNTER — Encounter: Payer: Medicaid Other | Attending: Family Medicine | Admitting: Dietician

## 2012-06-29 ENCOUNTER — Ambulatory Visit (INDEPENDENT_AMBULATORY_CARE_PROVIDER_SITE_OTHER): Payer: Medicaid Other | Admitting: Advanced Practice Midwife

## 2012-06-29 ENCOUNTER — Encounter: Payer: Self-pay | Admitting: Advanced Practice Midwife

## 2012-06-29 VITALS — BP 144/88 | Temp 98.5°F | Wt 197.0 lb

## 2012-06-29 DIAGNOSIS — Z713 Dietary counseling and surveillance: Secondary | ICD-10-CM | POA: Insufficient documentation

## 2012-06-29 DIAGNOSIS — O09529 Supervision of elderly multigravida, unspecified trimester: Secondary | ICD-10-CM

## 2012-06-29 DIAGNOSIS — O99810 Abnormal glucose complicating pregnancy: Secondary | ICD-10-CM | POA: Insufficient documentation

## 2012-06-29 DIAGNOSIS — O0990 Supervision of high risk pregnancy, unspecified, unspecified trimester: Secondary | ICD-10-CM

## 2012-06-29 MED ORDER — GLUCOSE BLOOD VI STRP: ORAL_STRIP | Status: DC

## 2012-06-29 MED ORDER — ACCU-CHEK FASTCLIX LANCETS MISC: 4.0000 [IU] | Freq: Four times a day (QID) | Status: DC

## 2012-06-29 NOTE — Progress Notes (Signed)
Pulse- 107  Pressure- abd  Pain- back Pt c/o "knot at my belly button"

## 2012-06-29 NOTE — Patient Instructions (Signed)
Gestational Diabetes Mellitus Gestational diabetes mellitus (GDM) is diabetes that occurs only during pregnancy. This happens when the body cannot properly handle the glucose (sugar) that increases in the blood after eating. During pregnancy, insulin resistance (reduced sensitivity to insulin) occurs because of the release of hormones from the placenta. Usually, the pancreas of pregnant women produces enough insulin to overcome the resistance that occurs. However, in gestational diabetes, the insulin is there but it does not work effectively. If the resistance is severe enough that the pancreas does not produce enough insulin, extra glucose builds up in the blood.  WHO IS AT RISK FOR DEVELOPING GESTATIONAL DIABETES?  Women with a history of diabetes in the family.  Women over age 25.  Women who are overweight.  Women in certain ethnic groups (Hispanic, African American, Native American, Asian and Pacific Islander). WHAT CAN HAPPEN TO THE BABY? If the mother's blood glucose is too high while she is pregnant, the extra sugar will travel through the umbilical cord to the baby. Some of the problems the baby may have are:  Large Baby - If the baby receives too much sugar, the baby will gain more weight. This may cause the baby to be too large to be born normally (vaginally) and a Cesarean section (C-section) may be needed.  Low Blood Glucose (hypoglycemia)  The baby makes extra insulin, in response to the extra sugar its gets from its mother. When the baby is born and no longer needs this extra insulin, the baby's blood glucose level may drop.  Jaundice (yellow coloring of the skin and eyes)  This is fairly common in babies. It is caused from a build-up of the chemical called bilirubin. This is rarely serious, but is seen more often in babies whose mothers had gestational diabetes. RISKS TO THE MOTHER Women who have had gestational diabetes may be at higher risk for some problems,  including:  Preeclampsia or toxemia, which includes problems with high blood pressure. Blood pressure and protein levels in the urine must be checked frequently.  Infections.  Cesarean section (C-section) for delivery.  Developing Type 2 diabetes later in life. About 30-50% will develop diabetes later, especially if obese. DIAGNOSIS  The hormones that cause insulin resistance are highest at about 24-28 weeks of pregnancy. If symptoms are experienced, they are much like symptoms you would normally expect during pregnancy.  GDM is often diagnosed using a two part method: 1. After 24-28 weeks of pregnancy, the woman drinks a glucose solution and takes a blood test. If the glucose level is high, a second test will be given. 2. Oral Glucose Tolerance Test (OGTT) which is 3 hours long  After not eating overnight, the blood glucose is checked. The woman drinks a glucose solution, and hourly blood glucose tests are taken. If the woman has risk factors for GDM, the caregiver may test earlier than 24 weeks of pregnancy. TREATMENT  Treatment of GDM is directed at keeping the mother's blood glucose level normal, and may include:  Meal planning.  Taking insulin or other medicine to control your blood glucose level.  Exercise.  Keeping a daily record of the foods you eat.  Blood glucose monitoring and keeping a record of your blood glucose levels.  May monitor ketone levels in the urine, although this is no longer considered necessary in most pregnancies. HOME CARE INSTRUCTIONS  While you are pregnant:  Follow your caregiver's advice regarding your prenatal appointments, meal planning, exercise, medicines, vitamins, blood and other tests, and physical   activities.  Keep a record of your meals, blood glucose tests, and the amount of insulin you are taking (if any). Show this to your caregiver at every prenatal visit.  If you have GDM, you may have problems with hypoglycemia (low blood glucose).  You may suspect this if you become suddenly dizzy, feel shaky, and/or weak. If you think this is happening and you have a glucose meter, try to test your blood glucose level. Follow your caregiver's advice for when and how to treat your low blood glucose. Generally, the 15:15 rule is followed: Treat by consuming 15 grams of carbohydrates, wait 15 minutes, and recheck blood glucose. Examples of 15 grams of carbohydrates are:  1 cup skim or low-fat milk.   cup juice.  3-4 glucose tablets.  5-6 hard candies.  1 small box raisins.   cup regular soda pop.  Practice good hygiene, to avoid infections.  Do not smoke. SEEK MEDICAL CARE IF:   You develop abnormal vaginal discharge, with or without itching.  You become weak and tired more than expected.  You seem to sweat a lot.  You have a sudden increase in weight, 5 pounds or more in one week.  You are losing weight, 3 pounds or more in a week.  Your blood glucose level is high, and you need instructions on what to do about it. SEEK IMMEDIATE MEDICAL CARE IF:   You develop a severe headache.  You faint or pass out.  You develop nausea and vomiting.  You become disoriented or confused.  You have a convulsion.  You develop vision problems.  You develop stomach pain.  You develop vaginal bleeding.  You develop uterine contractions.  You have leaking or a gush of fluid from the vagina. AFTER YOU HAVE THE BABY:  Go to all of your follow-up appointments, and have blood tests as advised by your caregiver.  Maintain a healthy lifestyle, to prevent diabetes in the future. This includes:  Following a healthy meal plan.  Controlling your weight.  Getting enough exercise and proper rest.  Do not smoke.  Breastfeed your baby if you can. This will lower the chance of you and your baby developing diabetes later in life. For more information about diabetes, go to the American Diabetes Association at:  www.americandiabetesassociation.org. For more information about gestational diabetes, go to the American Congress of Obstetricians and Gynecologists at: www.acog.org. Document Released: 11/25/2000 Document Revised: 11/11/2011 Document Reviewed: 06/19/2009 ExitCare Patient Information 2013 [employer].  

## 2012-06-29 NOTE — Progress Notes (Signed)
Feels well. Noticed a lump under the skin just below umbilicus.  This is c/w known fundal fibroid.  FBS 105, 98, 98,99,98,97,100  B-98,101,102, 120, 114,117, 103    L-123, 130   [phone]     Eats dinner 4pm but tests before bed at 10pm,  Will have her meet with Seward Grater today to review diet and testing as well as insulin regimen.  Is on Metformin 1000mg  bid as well as Insulin. Has been mixing R and N twice a day. Does not eat consistently.    >> given new machine for checking sugars. Counseled by [employer]

## 2012-06-29 NOTE — Progress Notes (Signed)
Diabetes Education:  Seen today with elevated fasting glucose levels as well as elevated through out the day.  Sporadic testing at times and then at others 6 times per day.  Notes that she is using 3 meters at this times in order to have enough strips.  Is trying to get the strips sent to her house by a mail order company, [employer], but they have not filled her prescription.  Reviewed her testing times and ask that she check fasting and 2 hours after the first bite of each meal.  Informed her that the mail order companies will not fill prescriptions for [place] pregnancy Medicaid.  She will need to get them from the pharmacy.  [facility] placed the order with [facility] and [employer]  Review of insulin revealed that she is mixing Novolog and NPH at bedtime. Instructed to mix Novolog and NPH before breakfast, take Novolog at before lunch, take Novolog before dinner and at bedtime take NPH.  I need to see her at her next visit to f/u with outcomes of our session.Carla Cezar Misiaszek, RN, CDE

## 2012-07-06 ENCOUNTER — Ambulatory Visit (INDEPENDENT_AMBULATORY_CARE_PROVIDER_SITE_OTHER): Payer: Medicaid Other | Admitting: Obstetrics & Gynecology

## 2012-07-06 VITALS — BP 114/71 | Temp 97.7°F | Wt 195.5 lb

## 2012-07-06 DIAGNOSIS — O10019 Pre-existing essential hypertension complicating pregnancy, unspecified trimester: Secondary | ICD-10-CM

## 2012-07-06 DIAGNOSIS — O24919 Unspecified diabetes mellitus in pregnancy, unspecified trimester: Secondary | ICD-10-CM

## 2012-07-06 LAB — POCT URINALYSIS DIP (DEVICE)
Glucose, UA: 100 mg/dL — AB
Nitrite: NEGATIVE
pH: 5.5 (ref 5.0–8.0)

## 2012-07-06 NOTE — Progress Notes (Signed)
Harmony negative. States FBS improved but needs to bring her log. Declines AFP, will wait for next [place] in 3 weeks.

## 2012-07-06 NOTE — Patient Instructions (Addendum)
Gestational Diabetes Mellitus Gestational diabetes mellitus (GDM) is diabetes that occurs only during pregnancy. This happens when the body cannot properly handle the glucose (sugar) that increases in the blood after eating. During pregnancy, insulin resistance (reduced sensitivity to insulin) occurs because of the release of hormones from the placenta. Usually, the pancreas of pregnant women produces enough insulin to overcome the resistance that occurs. However, in gestational diabetes, the insulin is there but it does not work effectively. If the resistance is severe enough that the pancreas does not produce enough insulin, extra glucose builds up in the blood.  WHO IS AT RISK FOR DEVELOPING GESTATIONAL DIABETES?  Women with a history of diabetes in the family.  Women over age 25.  Women who are overweight.  Women in certain ethnic groups (Hispanic, African American, Native American, Asian and Pacific Islander). WHAT CAN HAPPEN TO THE BABY? If the mother's blood glucose is too high while she is pregnant, the extra sugar will travel through the umbilical cord to the baby. Some of the problems the baby may have are:  Large Baby - If the baby receives too much sugar, the baby will gain more weight. This may cause the baby to be too large to be born normally (vaginally) and a Cesarean section (C-section) may be needed.  Low Blood Glucose (hypoglycemia)  The baby makes extra insulin, in response to the extra sugar its gets from its mother. When the baby is born and no longer needs this extra insulin, the baby's blood glucose level may drop.  Jaundice (yellow coloring of the skin and eyes)  This is fairly common in babies. It is caused from a build-up of the chemical called bilirubin. This is rarely serious, but is seen more often in babies whose mothers had gestational diabetes. RISKS TO THE MOTHER Women who have had gestational diabetes may be at higher risk for some problems,  including:  Preeclampsia or toxemia, which includes problems with high blood pressure. Blood pressure and protein levels in the urine must be checked frequently.  Infections.  Cesarean section (C-section) for delivery.  Developing Type 2 diabetes later in life. About 30-50% will develop diabetes later, especially if obese. DIAGNOSIS  The hormones that cause insulin resistance are highest at about 24-28 weeks of pregnancy. If symptoms are experienced, they are much like symptoms you would normally expect during pregnancy.  GDM is often diagnosed using a two part method: 1. After 24-28 weeks of pregnancy, the woman drinks a glucose solution and takes a blood test. If the glucose level is high, a second test will be given. 2. Oral Glucose Tolerance Test (OGTT) which is 3 hours long  After not eating overnight, the blood glucose is checked. The woman drinks a glucose solution, and hourly blood glucose tests are taken. If the woman has risk factors for GDM, the caregiver may test earlier than 24 weeks of pregnancy. TREATMENT  Treatment of GDM is directed at keeping the mother's blood glucose level normal, and may include:  Meal planning.  Taking insulin or other medicine to control your blood glucose level.  Exercise.  Keeping a daily record of the foods you eat.  Blood glucose monitoring and keeping a record of your blood glucose levels.  May monitor ketone levels in the urine, although this is no longer considered necessary in most pregnancies. HOME CARE INSTRUCTIONS  While you are pregnant:  Follow your caregiver's advice regarding your prenatal appointments, meal planning, exercise, medicines, vitamins, blood and other tests, and physical   activities.  Keep a record of your meals, blood glucose tests, and the amount of insulin you are taking (if any). Show this to your caregiver at every prenatal visit.  If you have GDM, you may have problems with hypoglycemia (low blood glucose).  You may suspect this if you become suddenly dizzy, feel shaky, and/or weak. If you think this is happening and you have a glucose meter, try to test your blood glucose level. Follow your caregiver's advice for when and how to treat your low blood glucose. Generally, the 15:15 rule is followed: Treat by consuming 15 grams of carbohydrates, wait 15 minutes, and recheck blood glucose. Examples of 15 grams of carbohydrates are:  1 cup skim or low-fat milk.   cup juice.  3-4 glucose tablets.  5-6 hard candies.  1 small box raisins.   cup regular soda pop.  Practice good hygiene, to avoid infections.  Do not smoke. SEEK MEDICAL CARE IF:   You develop abnormal vaginal discharge, with or without itching.  You become weak and tired more than expected.  You seem to sweat a lot.  You have a sudden increase in weight, 5 pounds or more in one week.  You are losing weight, 3 pounds or more in a week.  Your blood glucose level is high, and you need instructions on what to do about it. SEEK IMMEDIATE MEDICAL CARE IF:   You develop a severe headache.  You faint or pass out.  You develop nausea and vomiting.  You become disoriented or confused.  You have a convulsion.  You develop vision problems.  You develop stomach pain.  You develop vaginal bleeding.  You develop uterine contractions.  You have leaking or a gush of fluid from the vagina. AFTER YOU HAVE THE BABY:  Go to all of your follow-up appointments, and have blood tests as advised by your caregiver.  Maintain a healthy lifestyle, to prevent diabetes in the future. This includes:  Following a healthy meal plan.  Controlling your weight.  Getting enough exercise and proper rest.  Do not smoke.  Breastfeed your baby if you can. This will lower the chance of you and your baby developing diabetes later in life. For more information about diabetes, go to the American Diabetes Association at:  www.americandiabetesassociation.org. For more information about gestational diabetes, go to the American Congress of Obstetricians and Gynecologists at: www.acog.org. Document Released: 11/25/2000 Document Revised: 11/11/2011 Document Reviewed: 06/19/2009 ExitCare Patient Information 2013 [employer].  

## 2012-07-06 NOTE — Progress Notes (Signed)
P = 90 Mild abdominal pain

## 2012-07-11 ENCOUNTER — Other Ambulatory Visit: Payer: Self-pay | Admitting: Family Medicine

## 2012-07-20 ENCOUNTER — Encounter: Payer: Medicaid Other | Admitting: Family Medicine

## 2012-07-22 ENCOUNTER — Inpatient Hospital Stay (HOSPITAL_COMMUNITY): Payer: Medicaid Other

## 2012-07-22 ENCOUNTER — Inpatient Hospital Stay (HOSPITAL_COMMUNITY)
Admission: AD | Admit: 2012-07-22 | Discharge: 2012-07-22 | Disposition: A | Discharge status: Home / Self Care | Payer: Medicaid Other | Source: Ambulatory Visit | Attending: Obstetrics & Gynecology | Admitting: Obstetrics & Gynecology

## 2012-07-22 ENCOUNTER — Encounter (HOSPITAL_COMMUNITY): Payer: Self-pay | Admitting: *Deleted

## 2012-07-22 DIAGNOSIS — R319 Hematuria, unspecified: Secondary | ICD-10-CM | POA: Insufficient documentation

## 2012-07-22 DIAGNOSIS — N390 Urinary tract infection, site not specified: Secondary | ICD-10-CM | POA: Insufficient documentation

## 2012-07-22 DIAGNOSIS — O209 Hemorrhage in early pregnancy, unspecified: Secondary | ICD-10-CM | POA: Insufficient documentation

## 2012-07-22 DIAGNOSIS — O2390 Unspecified genitourinary tract infection in pregnancy, unspecified trimester: Secondary | ICD-10-CM | POA: Insufficient documentation

## 2012-07-22 DIAGNOSIS — O2340 Unspecified infection of urinary tract in pregnancy, unspecified trimester: Secondary | ICD-10-CM

## 2012-07-22 LAB — URINE MICROSCOPIC-ADD ON

## 2012-07-22 LAB — URINALYSIS, ROUTINE W REFLEX MICROSCOPIC
Bilirubin Urine: NEGATIVE
Nitrite: NEGATIVE
Specific Gravity, Urine: 1.02 (ref 1.005–1.030)
Urobilinogen, UA: 0.2 mg/dL (ref 0.0–1.0)

## 2012-07-22 LAB — CBC
HCT: 25.8 % — ABNORMAL LOW (ref 36.0–46.0)
Hemoglobin: 8.3 g/dL — ABNORMAL LOW (ref 12.0–15.0)
MCHC: 32.2 g/dL (ref 30.0–36.0)

## 2012-07-22 LAB — GLUCOSE, CAPILLARY

## 2012-07-22 LAB — WET PREP, GENITAL

## 2012-07-22 MED ORDER — CEPHALEXIN 500 MG PO CAPS
500.0000 mg | ORAL_CAPSULE | Freq: Once | ORAL | Status: AC
  Administered 2012-07-22: 500 mg via ORAL
  Filled 2012-07-22: qty 1

## 2012-07-22 MED ORDER — CEPHALEXIN 500 MG PO CAPS: 500.0000 mg | ORAL_CAPSULE | Freq: Four times a day (QID) | ORAL | Status: DC

## 2012-07-22 NOTE — MAU Provider Note (Signed)
History     CSN: [id number]  Arrival date and time: 07/22/12 4098   First Provider Initiated Contact with Patient 07/22/12 0251      Chief Complaint  Patient presents with  . Vaginal Bleeding  . Abdominal Pain   HPI Carla Brooks is a 41 y.o. female @ [redacted]w[redacted]d gestation who presents to MAU via EMS with vaginal bleeding. She states that she woke approximately 1 am and went to the bathroom and noted blood on the tissue. Went back to bed and later woke again with nausea and had a gush of blood. Associated symptoms include abdominal cramping. The symptoms have subsided since she arrived to MAU. This pregnancy started as triplets and she has lost 2 of the pregnancies. She is a patient in the [facility]. She has fibroids, diabetes and hypertension. The history was provided by the patient.  OB History    Grav Para Term Preterm Abortions TAB SAB Ect Mult Living   3 2 2       2       Past Medical History  Diagnosis Date  . Diabetes mellitus   . Hypertension   . Bronchitis   . Arthritis   . GERD (gastroesophageal reflux disease)   . Asthma   . Migraines 02/21/2012  . Anemia 02/21/2012  . HTN (hypertension) 02/21/2012  . Diabetes mellitus 02/21/2012    Past Surgical History  Procedure Date  . Cholecystectomy 09/27/2001  . No past surgeries     Family History  Problem Relation Age of Onset  . Coronary artery disease Father   . Cancer Paternal Uncle     throat  . Diabetes Maternal Grandmother   . Cancer Maternal Grandfather     prostate  . Cancer Paternal Grandfather     lung    History  Substance Use Topics  . Smoking status: Never Smoker   . Smokeless tobacco: Never Used  . Alcohol Use: No    Allergies:  Allergies  Allergen Reactions  . Penicillins Hives  . Shrimp (Shellfish Allergy) Hives and Swelling    Prescriptions prior to admission  Medication Sig Dispense Refill  . ACCU-CHEK FASTCLIX LANCETS MISC 4 Units by Percutaneous route 4 (four) times daily.  100 each  12    . acetaminophen (TYLENOL) 325 MG tablet Take 650 mg by mouth every 6 (six) hours as needed.      Marland Kitchen glucose blood (ACCU-CHEK SMARTVIEW) test strip Use as instructed  100 each  12  . insulin aspart (NOVOLOG) 100 UNIT/ML injection Inject 12 Units into the skin 3 (three) times daily before meals.  1 vial  12  . insulin NPH (HUMULIN N) 100 UNIT/ML injection Inject 10 Units into the skin 2 (two) times daily. q am and q hs  10 mL  12  . labetalol (NORMODYNE) 100 MG tablet TAKE 3 TABLETS (300 MG TOTAL) BY MOUTH 2 (TWO) TIMES DAILY.  60 tablet  0  . metFORMIN (GLUCOPHAGE) 1000 MG tablet Take 1,000 mg by mouth 2 (two) times daily with a meal.      . omeprazole (PRILOSEC) 20 MG capsule Take 20 mg by mouth 2 (two) times daily.      Marland Kitchen albuterol (PROVENTIL HFA;VENTOLIN HFA) 108 (90 BASE) MCG/ACT inhaler Inhale 2 puffs into the lungs every 6 (six) hours as needed. For wheezing      . EPINEPHrine (EPIPEN) 0.3 mg/0.3 mL DEVI Inject 0.3 mLs (0.3 mg total) into the muscle as needed. Use as directed in the event of  a severe allergic reaction  1 Device  3  . labetalol (NORMODYNE) 100 MG tablet Take 4 tablets (400 mg total) by mouth 2 (two) times daily.  60 tablet  0    Review of Systems  Constitutional: Negative for fever and chills.  Eyes: Negative for blurred vision and double vision.  Respiratory: Negative for cough.   Cardiovascular: Negative for chest pain.  Gastrointestinal: Positive for nausea and abdominal pain (cramping). Negative for vomiting.  Genitourinary: Positive for frequency. Negative for dysuria and urgency.  Musculoskeletal: Negative for back pain.  Skin: Negative for rash.  Neurological: Negative for dizziness and headaches.  Psychiatric/Behavioral: Negative for depression.   Physical Exam   Blood pressure 136/86, pulse 89, temperature 98.3 F (36.8 C), temperature source Oral, resp. rate 18, weight 195 lb (88.451 kg), last menstrual period 03/21/2012.  Physical Exam  Nursing note and  vitals reviewed. Constitutional: She is oriented to person, place, and time. She appears well-developed and well-nourished. No distress.  HENT:  Head: Normocephalic and atraumatic.  Eyes: EOM are normal.  Neck: Neck supple.  Cardiovascular: Normal rate.   Respiratory: Effort normal.  GI: Soft. There is no tenderness. There is no CVA tenderness.       Positive FHT's  Genitourinary:       External genitalia without lesions. No blood noted vaginal vault. White discharge. Cervix closed, no CMT, no adnexal tenderness. Uterus larger than dates due to fibroids.  Musculoskeletal: Normal range of motion.  Neurological: She is alert and oriented to person, place, and time.  Skin: Skin is warm and dry.  Psychiatric: She has a normal mood and affect. Her behavior is normal. Judgment and thought content normal.   Results for orders placed during the hospital encounter of 07/22/12 (from the past 24 hour(s))  URINALYSIS, ROUTINE W REFLEX MICROSCOPIC     Status: Abnormal   Collection Time   07/22/12  2:35 AM      Component Value Range   Color, Urine YELLOW  YELLOW   APPearance CLEAR  CLEAR   Specific Gravity, Urine 1.020  1.005 - 1.030   pH 7.0  5.0 - 8.0   Glucose, UA NEGATIVE  NEGATIVE mg/dL   Hgb urine dipstick LARGE (*) NEGATIVE   Bilirubin Urine NEGATIVE  NEGATIVE   Ketones, ur NEGATIVE  NEGATIVE mg/dL   Protein, ur NEGATIVE  NEGATIVE mg/dL   Urobilinogen, UA 0.2  0.0 - 1.0 mg/dL   Nitrite NEGATIVE  NEGATIVE   Leukocytes, UA MODERATE (*) NEGATIVE  URINE MICROSCOPIC-ADD ON     Status: Abnormal   Collection Time   07/22/12  2:35 AM      Component Value Range   Squamous Epithelial / LPF MANY (*) RARE   WBC, UA 11-20  <3 WBC/hpf   RBC / HPF 21-50  <3 RBC/hpf   Bacteria, UA FEW (*) RARE  GLUCOSE, CAPILLARY     Status: Normal   Collection Time   07/22/12  2:39 AM      Component Value Range   Glucose-Capillary 93  70 - 99 mg/dL   Comment 1 Notify RN    WET PREP, GENITAL     Status:  Abnormal   Collection Time   07/22/12  2:48 AM      Component Value Range   Yeast Wet Prep HPF POC NONE SEEN  NONE SEEN   Trich, Wet Prep NONE SEEN  NONE SEEN   Clue Cells Wet Prep HPF POC FEW (*) NONE SEEN  WBC, Wet Prep HPF POC MODERATE (*) NONE SEEN  CBC     Status: Abnormal   Collection Time   07/22/12  2:55 AM      Component Value Range   WBC 10.4  4.0 - 10.5 K/uL   RBC 2.93 (*) 3.87 - 5.11 MIL/uL   Hemoglobin 8.3 (*) 12.0 - 15.0 g/dL   HCT 16.1 (*) 09.6 - 04.5 %   MCV 88.1  78.0 - 100.0 fL   MCH 28.3  26.0 - 34.0 pg   MCHC 32.2  30.0 - 36.0 g/dL   RDW 40.9  81.1 - 91.4 %   Platelets 352  150 - 400 K/uL   US Ob Limited  07/22/2012  *RADIOLOGY REPORT*  Clinical Data: Bleeding, pain, pregnant.  LIMITED OBSTETRIC ULTRASOUND  Number of Fetuses: 1 Heart Rate: 152 bpm Movement: Identified Presentation: Transverse, head right Placental Location: Anterior Previa: Not identified Amniotic Fluid (Subjective): Within normal limits  Vertical pocket:  5.3cm  MATERNAL FINDINGS: Cervix: Within normal limits, measures 3.7 cm  Uterus/Adnexae: Multiple fibroids, largest of which measures 6 x 6.4 x 5.5 cm.  IMPRESSION: Intrauterine gestation with cardiac activity and movement documented.  Fibroid uterus.   Original Report Authenticated By: Jearld Lesch, M.D.     Assessment: 41 y.o. female @[redacted]w[redacted]d  gestation with hematuria   UTI  Plan:  Rx Keflex   Urine sent for culture   GC, Chlamydia cultures sent   Follow up in [facility], return here as needed  I have reviewed this patient's vital signs, nurses notes, appropriate labs and imaging. I have discussed findings with the patient and plan of care. Patient voices understanding.    Medication List     As of 07/22/2012  3:43 AM    START taking these medications         cephALEXin 500 MG capsule   Commonly known as: KEFLEX   Take 1 capsule (500 mg total) by mouth 4 (four) times daily.      CONTINUE taking these medications         ACCU-CHEK  FASTCLIX LANCETS Misc   4 Units by Percutaneous route 4 (four) times daily.      acetaminophen 325 MG tablet   Commonly known as: TYLENOL      albuterol 108 (90 BASE) MCG/ACT inhaler   Commonly known as: PROVENTIL HFA;VENTOLIN HFA      EPINEPHrine 0.3 mg/0.3 mL Devi   Commonly known as: EPI-PEN   Inject 0.3 mLs (0.3 mg total) into the muscle as needed. Use as directed in the event of a severe allergic reaction      glucose blood test strip   Use as instructed      insulin aspart 100 UNIT/ML injection   Commonly known as: novoLOG   Inject 12 Units into the skin 3 (three) times daily before meals.      insulin NPH 100 UNIT/ML injection   Commonly known as: HUMULIN N,NOVOLIN N   Inject 10 Units into the skin 2 (two) times daily. q am and q hs      * labetalol 100 MG tablet   Commonly known as: NORMODYNE   TAKE 3 TABLETS (300 MG TOTAL) BY MOUTH 2 (TWO) TIMES DAILY.      metFORMIN 1000 MG tablet   Commonly known as: GLUCOPHAGE      omeprazole 20 MG capsule   Commonly known as: PRILOSEC     * Notice: This list has 1 medication(s) that are the same  as other medications prescribed for you. Read the directions carefully, and ask your doctor or other care provider to review them with you.    ASK your doctor about these medications         * labetalol 100 MG tablet   Commonly known as: NORMODYNE   Take 4 tablets (400 mg total) by mouth 2 (two) times daily.     * Notice: This list has 1 medication(s) that are the same as other medications prescribed for you. Read the directions carefully, and ask your doctor or other care provider to review them with you.        Where to get your medications    These are the prescriptions that you need to pick up. We sent them to a specific pharmacy, so you will need to go there to get them.   [id number] - [place], [place] - [address] AT [facility] [address]    [address] [place] [address]    Phone:  [phone]        cephALEXin 500 MG capsule           MAU Course: discussed with Dr. Marice Potter  Procedures  NEESE,HOPE, RN, FNP, [facility] 07/22/2012, 2:52 AM

## 2012-07-22 NOTE — MAU Note (Signed)
Blood tinged urine. Then started to passed huge clots with abdominal pain around 1am.

## 2012-07-23 LAB — GC/CHLAMYDIA PROBE AMP, GENITAL
Chlamydia, DNA Probe: NEGATIVE
GC Probe Amp, Genital: NEGATIVE

## 2012-07-23 LAB — URINE CULTURE

## 2012-07-27 ENCOUNTER — Ambulatory Visit (INDEPENDENT_AMBULATORY_CARE_PROVIDER_SITE_OTHER): Payer: Medicaid Other | Admitting: Obstetrics and Gynecology

## 2012-07-27 ENCOUNTER — Encounter: Payer: Self-pay | Admitting: Obstetrics and Gynecology

## 2012-07-27 ENCOUNTER — Encounter: Payer: Medicaid Other | Attending: Family Medicine | Admitting: Dietician

## 2012-07-27 VITALS — BP 126/86 | Temp 98.0°F | Wt 200.0 lb

## 2012-07-27 DIAGNOSIS — O3410 Maternal care for benign tumor of corpus uteri, unspecified trimester: Secondary | ICD-10-CM

## 2012-07-27 DIAGNOSIS — O4690 Antepartum hemorrhage, unspecified, unspecified trimester: Secondary | ICD-10-CM | POA: Insufficient documentation

## 2012-07-27 DIAGNOSIS — Z713 Dietary counseling and surveillance: Secondary | ICD-10-CM | POA: Insufficient documentation

## 2012-07-27 DIAGNOSIS — I10 Essential (primary) hypertension: Secondary | ICD-10-CM

## 2012-07-27 DIAGNOSIS — D259 Leiomyoma of uterus, unspecified: Secondary | ICD-10-CM

## 2012-07-27 DIAGNOSIS — O99810 Abnormal glucose complicating pregnancy: Secondary | ICD-10-CM | POA: Insufficient documentation

## 2012-07-27 HISTORY — DX: Leiomyoma of uterus, unspecified: O34.10

## 2012-07-27 HISTORY — DX: Leiomyoma of uterus, unspecified: D25.9

## 2012-07-27 LAB — POCT URINALYSIS DIP (DEVICE)
Nitrite: NEGATIVE
Protein, ur: 30 mg/dL — AB
Urobilinogen, UA: 0.2 mg/dL (ref 0.0–1.0)
pH: 5.5 (ref 5.0–8.0)

## 2012-07-27 MED ORDER — IBUPROFEN 800 MG PO TABS: 800.0000 mg | ORAL_TABLET | Freq: Three times a day (TID) | ORAL | Status: DC | PRN

## 2012-07-27 NOTE — Progress Notes (Signed)
Diabetes Education:  Seen today due to issues with taking insulin.  She has been mixing the Novolin N and the Novolog in the evening and having high glucose levels later in the evening.  She is confused as to when to mix.  She says one of the nurses told her take the Novolin N only 1 time per day.  She gives me two stories.  Review my not on June 01, 2012.  I have ask her to bring all the materials that I have provided her.  Today established she is to mix the Novolin N 10 units and  the Novolog 12 units in the morning and take 10-15 minutes before breakfast.  Take the Novolog 12 units 10-15 minutes before lunch and the Novolog 12 units 10-15 minutes before dinner and then take the Novolin N 10 units at bedtime.Will need to see her at next clinic visit.  She is to call the clinic with problems before noon on Wednesday or if has problems on the weekend, go to the [facility] ED.  Maggie Kahlea Cobert, RN, RD, CDE

## 2012-07-27 NOTE — Progress Notes (Signed)
CHTN:BPs stable on labetalol 200 mg bid.  DM: on insulin and Metformin 1000 mg bid. F: 78,93, - - - -,98,93 2 hr pc brk -,-,-,161,09,604,54,-; 2 hr pc lun: 126, 147, 126, - 133, 88, 113' 2hr pc din-, 186,118,130,119,-,142. On Keflex for UTI dx 6 d ago. See Maggie. Confused about when to mix N&H:  Reviewed to take N and H mixed in am H with lunch and dinner, N at night. ? Degenerating fibroids: Cramps persistent and severe on occasion several times a day. No further vaginal bleed since seen 1 wk ago. Has anatomy scan in 2 days. Rx short course NSAID for fibroid pain.

## 2012-07-27 NOTE — Progress Notes (Signed)
Pulse- 94 

## 2012-07-27 NOTE — Patient Instructions (Signed)
INSULIN (SHORT ACTING) MEDICATION INSTRUCTIONS (Regular, Humulog, Novolog)  The day before your procedure:  * Do NOT take your evening dose  The day of your procedure:   * Do NOT take your morning dose  INSULIN (LONG ACTING) MEDICATION INSTRUCTIONS (Lantus, NPH, 70/30, Humulin, Novolin-H)  The day before your procedure:  * Take 1/2 your regular evening dose  The day of your procedure:   * Do NOT take your morning dose  Uterine Fibroid A uterine fibroid is a growth (tumor) that occurs in a woman's uterus. This type of tumor is not cancerous and does not spread out of the uterus. A woman can have one or many fibroids, and the fiboid(s) can become quite large. A fibroid can vary in size, weight, and where it grows in the uterus. Most fibroids do not require medical treatment, but some can cause pain or heavy bleeding during and between periods. CAUSES  A fibroid is the result of a single uterine cell that keeps growing (unregulated), which is different than most cells in the human body. Most cells have a control mechanism that keeps them from reproducing without control.  SYMPTOMS   Bleeding.  Pelvic pain and pressure.  Bladder problems due to the size of the fibroid.  Infertility and miscarriages depending on the size and location of the fibroid. DIAGNOSIS  A diagnosis is made by physical exam. Your caregiver may feel the lumpy tumors during a pelvic exam. Important information regarding size, location, and number of tumors can be gained by having an ultrasound. It is rare that other tests, such as a CT scan or MRI, are needed. TREATMENT   Your caregiver may recommend watchful waiting. This involves getting the fibroid checked by your caregiver to see if the fibroids grow or shrink.   Hormonal treatment or an intrauterine device (IUD) may be prescribed.   Surgery may be needed to remove the fibroids (myomectomy) or the uterus (hysterectomy). This depends on your  situation. When fibroids interfere with fertility and a woman wants to become pregnant, a caregiver may recommend having the fibroids removed.  HOME CARE INSTRUCTIONS  Home care depends on how you were treated. In general:   Keep all follow-up appointments with your caregiver.   Only take medicine as told by your caregiver. Do not take aspirin. It can cause bleeding.   If you have excessive periods and soak tampons or pads in a half hour or less, contact your caregiver immediately. If your periods are troublesome but not so heavy, lie down with your feet raised slightly above your heart. Place cold packs on your lower abdomen.   If your periods are heavy, write down the number of pads or tampons you use per month. Bring this information to your caregiver.   Talk to your caregiver about taking iron pills.   Include green vegetables in your diet.   If you were prescribed a hormonal treatment, take the hormonal medicines as directed.   If you need surgery, ask your caregiver for information on your specific surgery.  SEEK IMMEDIATE MEDICAL CARE IF:  You have pelvic pain or cramps not controlled with medicines.   You have a sudden increase in pelvic pain.   You have an increase of bleeding between and during periods.   You feel lightheaded or have fainting episodes.  MAKE SURE YOU:  Understand these instructions.  Will watch your condition.  Will get help right away if you are not doing well or get worse. Document Released: 08/16/2000  Document Revised: 11/11/2011 Document Reviewed: 09/09/2011 [facility] Patient Information 2013 [place], [place].

## 2012-07-29 ENCOUNTER — Encounter (HOSPITAL_COMMUNITY): Payer: Self-pay | Admitting: Family

## 2012-07-29 ENCOUNTER — Other Ambulatory Visit (HOSPITAL_COMMUNITY): Payer: Self-pay | Admitting: Maternal and Fetal Medicine

## 2012-07-29 ENCOUNTER — Ambulatory Visit (HOSPITAL_COMMUNITY)
Admission: RE | Admit: 2012-07-29 | Discharge: 2012-07-29 | Disposition: A | Discharge status: Home / Self Care | Payer: Medicaid Other | Source: Ambulatory Visit | Attending: Obstetrics and Gynecology | Admitting: Obstetrics and Gynecology

## 2012-07-29 ENCOUNTER — Inpatient Hospital Stay (HOSPITAL_COMMUNITY)
Admission: AD | Admit: 2012-07-29 | Discharge: 2012-07-29 | Disposition: A | Discharge status: Home / Self Care | Payer: Medicaid Other | Source: Ambulatory Visit | Attending: Family Medicine | Admitting: Family Medicine

## 2012-07-29 VITALS — BP 139/89 | HR 92 | Wt 199.8 lb

## 2012-07-29 DIAGNOSIS — O10019 Pre-existing essential hypertension complicating pregnancy, unspecified trimester: Secondary | ICD-10-CM

## 2012-07-29 DIAGNOSIS — I10 Essential (primary) hypertension: Secondary | ICD-10-CM

## 2012-07-29 DIAGNOSIS — O24919 Unspecified diabetes mellitus in pregnancy, unspecified trimester: Secondary | ICD-10-CM | POA: Insufficient documentation

## 2012-07-29 DIAGNOSIS — O2340 Unspecified infection of urinary tract in pregnancy, unspecified trimester: Secondary | ICD-10-CM

## 2012-07-29 DIAGNOSIS — E162 Hypoglycemia, unspecified: Secondary | ICD-10-CM

## 2012-07-29 DIAGNOSIS — O0990 Supervision of high risk pregnancy, unspecified, unspecified trimester: Secondary | ICD-10-CM

## 2012-07-29 DIAGNOSIS — O3410 Maternal care for benign tumor of corpus uteri, unspecified trimester: Secondary | ICD-10-CM

## 2012-07-29 DIAGNOSIS — E119 Type 2 diabetes mellitus without complications: Secondary | ICD-10-CM | POA: Insufficient documentation

## 2012-07-29 DIAGNOSIS — O09529 Supervision of elderly multigravida, unspecified trimester: Secondary | ICD-10-CM | POA: Insufficient documentation

## 2012-07-29 LAB — GLUCOSE, CAPILLARY
Glucose-Capillary: 28 mg/dL — CL (ref 70–99)
Glucose-Capillary: 47 mg/dL — ABNORMAL LOW (ref 70–99)

## 2012-07-29 LAB — COMPREHENSIVE METABOLIC PANEL
ALT: 22 U/L (ref 0–35)
AST: 19 U/L (ref 0–37)
Albumin: 2.9 g/dL — ABNORMAL LOW (ref 3.5–5.2)
Alkaline Phosphatase: 83 U/L (ref 39–117)
CO2: 22 mEq/L (ref 19–32)
Chloride: 101 mEq/L (ref 96–112)
Creatinine, Ser: 0.69 mg/dL (ref 0.50–1.10)
GFR calc non Af Amer: >90 mL/min (ref >90)
Potassium: 3.1 mEq/L — ABNORMAL LOW (ref 3.5–5.1)
Total Bilirubin: 0.1 mg/dL — ABNORMAL LOW (ref 0.3–1.2)

## 2012-07-29 LAB — CBC
MCH: 28.7 pg (ref 26.0–34.0)
MCHC: 32.7 g/dL (ref 30.0–36.0)
MCV: 87.7 fL (ref 78.0–100.0)
Platelets: 446 10*3/uL — ABNORMAL HIGH (ref 150–400)
RBC: 3.1 MIL/uL — ABNORMAL LOW (ref 3.87–5.11)
RDW: 14.1 % (ref 11.5–15.5)

## 2012-07-29 MED ORDER — GLUCAGON HCL (RDNA) 1 MG IJ SOLR
INTRAMUSCULAR | Status: AC
  Filled 2012-07-29: qty 1

## 2012-07-29 MED ORDER — DEXTROSE 5 % IN LACTATED RINGERS IV BOLUS: 1000.0000 mL | Freq: Once | INTRAVENOUS | Status: DC

## 2012-07-29 NOTE — MAU Note (Signed)
Patient was being seen in MFM and CBG was 28. Sent to MAU for evaluation.

## 2012-07-29 NOTE — Progress Notes (Signed)
Zarinah Blackard  was seen today for an ultrasound appointment.  See full report in [facility].  Comments: Ms. Debruler returns today for follow up due to pregestational diabetes on insulin, chronic hypertension and advanced maternal age.  During the ultrasound examination, the patient acted very sleepy and did not feel well.  A clinic fingerstick glucose was performed that was < 30 mg/dl - the patient was taken directed to the MAU for further evaluation.  Impression: Single IUP at 19 3/7 weeks s/p early loss of triplets (Baby A / B) Echogenic intracardiac focus noted in the left ventcle. 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 One anterior fibroid is located on the edge of the placental implantation site.  Recommendations: Fetal echo scheduled on 9 December Recommend follow up ultrasound in 4 weeks for growth.  Alpha Gula, MD

## 2012-07-29 NOTE — ED Notes (Signed)
Dr. Claudean Severance in to discuss [place] results with pt. At this time pt was clammy and not feeling well.  Blood sugar taken and found to be 28.  Pt stated that she did not eat breakfast this morning but took insulin.  Called MAU charge nurse and made aware of pt.  Pt transferred to MAU room 10. Charge nurse at bedside.

## 2012-07-29 NOTE — MAU Provider Note (Signed)
Chart reviewed and agree with management and plan.  

## 2012-07-29 NOTE — MAU Provider Note (Signed)
History     CSN: [id number]  Arrival date and time: 07/29/12 1141  Seen by provider at 1150     Chief Complaint  Patient presents with  . Hypoglycemia   HPI Carla Brooks 41 y.o. [redacted]w[redacted]d Comes to MAU from Maternal Fetal Medicine.  Had ultrasound and became very drowsy.  Blood glucose was 28.  Given juice to drink and brought to MAU.  Assisted by bed from wheelchair.  Very drowsy but began becoming more coherent.  Able to answer questions.  Repeat blood glucose was 48.  History - client took insulin this morning.  Was afraid the ultrasound would make her nauseated so she did not eat prior to coming for ultrasound.  Was planning to eat after the procedure.  OB History    Grav Para Term Preterm Abortions TAB SAB Ect Mult Living   3 2 2       2       Past Medical History  Diagnosis Date  . Diabetes mellitus   . Hypertension   . Bronchitis   . Arthritis   . GERD (gastroesophageal reflux disease)   . Asthma   . Migraines 02/21/2012  . Anemia 02/21/2012  . HTN (hypertension) 02/21/2012  . Diabetes mellitus 02/21/2012    Past Surgical History  Procedure Date  . Cholecystectomy 09/27/2001  . No past surgeries     Family History  Problem Relation Age of Onset  . Coronary artery disease Father   . Cancer Paternal Uncle     throat  . Diabetes Maternal Grandmother   . Cancer Maternal Grandfather     prostate  . Cancer Paternal Grandfather     lung    History  Substance Use Topics  . Smoking status: Never Smoker   . Smokeless tobacco: Never Used  . Alcohol Use: No    Allergies:  Allergies  Allergen Reactions  . Penicillins Hives  . Shrimp (Shellfish Allergy) Hives and Swelling    Prescriptions prior to admission  Medication Sig Dispense Refill  . ACCU-CHEK FASTCLIX LANCETS MISC 4 Units by Percutaneous route 4 (four) times daily.  100 each  12  . acetaminophen (TYLENOL) 325 MG tablet Take 650 mg by mouth every 6 (six) hours as needed.      Marland Kitchen albuterol (PROVENTIL  HFA;VENTOLIN HFA) 108 (90 BASE) MCG/ACT inhaler Inhale 2 puffs into the lungs every 6 (six) hours as needed. For wheezing      . cephALEXin (KEFLEX) 500 MG capsule Take 1 capsule (500 mg total) by mouth 4 (four) times daily.  28 capsule  0  . EPINEPHrine (EPIPEN) 0.3 mg/0.3 mL DEVI Inject 0.3 mLs (0.3 mg total) into the muscle as needed. Use as directed in the event of a severe allergic reaction  1 Device  3  . glucose blood (ACCU-CHEK SMARTVIEW) test strip Use as instructed  100 each  12  . ibuprofen (ADVIL,MOTRIN) 800 MG tablet Take 1 tablet (800 mg total) by mouth every 8 (eight) hours as needed for pain.  12 tablet  0  . insulin aspart (NOVOLOG) 100 UNIT/ML injection Inject 12 Units into the skin 3 (three) times daily before meals.  1 vial  12  . insulin NPH (HUMULIN N) 100 UNIT/ML injection Inject 10 Units into the skin 2 (two) times daily. q am and q hs  10 mL  12  . labetalol (NORMODYNE) 100 MG tablet TAKE 3 TABLETS (300 MG TOTAL) BY MOUTH 2 (TWO) TIMES DAILY.  60 tablet  0  .  metFORMIN (GLUCOPHAGE) 1000 MG tablet Take 1,000 mg by mouth 2 (two) times daily with a meal.      . omeprazole (PRILOSEC) 20 MG capsule Take 20 mg by mouth 2 (two) times daily.        Review of Systems  Constitutional: Positive for malaise/fatigue.       Became weak during ultrasound.  Gastrointestinal: Negative for nausea, vomiting and abdominal pain.  Neurological: Positive for weakness. Negative for seizures and loss of consciousness.   Physical Exam   Blood pressure 129/65, pulse 70, temperature 97.6 F (36.4 C), temperature source Oral, resp. rate 16, last menstrual period 03/21/2012.  Physical Exam  Nursing note and vitals reviewed. Constitutional: She is oriented to person, place, and time. She appears well-developed and well-nourished.       Lying on her side.' Able to drink juice and eat crackers.  HENT:  Head: Normocephalic.  Eyes: EOM are normal.  Neck: Neck supple.  Musculoskeletal: Normal  range of motion.  Neurological: She is oriented to person, place, and time.       Increasingly more alert after lying down on bed.  Skin: Skin is warm and dry.  Psychiatric: She has a normal mood and affect.    MAU Course  Procedures  MDM Dr. Shawnie Pons notified on client's arrival to MAU.  Came to see client.  Client able to talk and answer questions.  Mental status improving.  Decided to give juice and food (crackers, peanut butter and sandwich) to treat low blood sugar.  Client advised not to take insulin and skip her meal. 1255  Repeat blood sugar after eating a sandwich was 63.  Client much improved and asking to go home.  Accompanied by friend who is driving.  Will discharge.  Advised to have food available this afternoon and check blood sugar again if she becomes weak and to eat if sugar is low.  Assessment and Plan  Hypoglycemia  Plan Treated with PO juice and food Next appointment on Monday in clinic Advised to eat regular meals and snacks. Do not take insulin and skip a meal.  Carla Brooks 07/29/2012, 11:53 AM

## 2012-07-29 NOTE — MAU Note (Signed)
Pt reports Type 2 DM and was at MFM for [place] and began to feel hot and not well; CBG checked (28) and was sent to MAU.

## 2012-08-03 ENCOUNTER — Ambulatory Visit (INDEPENDENT_AMBULATORY_CARE_PROVIDER_SITE_OTHER): Payer: Medicaid Other | Admitting: Obstetrics & Gynecology

## 2012-08-03 VITALS — BP 126/85 | Temp 97.2°F | Wt 199.1 lb

## 2012-08-03 DIAGNOSIS — O0990 Supervision of high risk pregnancy, unspecified, unspecified trimester: Secondary | ICD-10-CM

## 2012-08-03 DIAGNOSIS — O10019 Pre-existing essential hypertension complicating pregnancy, unspecified trimester: Secondary | ICD-10-CM

## 2012-08-03 DIAGNOSIS — O09529 Supervision of elderly multigravida, unspecified trimester: Secondary | ICD-10-CM

## 2012-08-03 DIAGNOSIS — O24919 Unspecified diabetes mellitus in pregnancy, unspecified trimester: Secondary | ICD-10-CM

## 2012-08-03 DIAGNOSIS — E119 Type 2 diabetes mellitus without complications: Secondary | ICD-10-CM

## 2012-08-03 LAB — POCT URINALYSIS DIP (DEVICE)
Nitrite: NEGATIVE
Protein, ur: 30 mg/dL — AB
Specific Gravity, Urine: >=1.03 (ref 1.005–1.030)
Urobilinogen, UA: 0.2 mg/dL (ref 0.0–1.0)
pH: 5.5 (ref 5.0–8.0)

## 2012-08-03 NOTE — Progress Notes (Signed)
Pulse: 99 

## 2012-08-03 NOTE — Patient Instructions (Signed)
Return to clinic for any obstetric concerns or go to MAU for evaluation  

## 2012-08-03 NOTE — Progress Notes (Signed)
Two fasting CBGs  >100; two postprandial lunch in 130s; all other CBGs within normal range. Increased Novolin NPH to 12/12; will keep Novolog at 08/14/11.  Anatomy u/s normal on 11/27; just had echogenic intracardiac focus. OB precautions given.

## 2012-08-04 ENCOUNTER — Other Ambulatory Visit: Payer: Self-pay | Admitting: Family Medicine

## 2012-08-07 ENCOUNTER — Other Ambulatory Visit (HOSPITAL_COMMUNITY): Payer: Self-pay | Admitting: Maternal and Fetal Medicine

## 2012-08-07 DIAGNOSIS — IMO0001 Reserved for inherently not codable concepts without codable children: Secondary | ICD-10-CM

## 2012-08-07 DIAGNOSIS — O09529 Supervision of elderly multigravida, unspecified trimester: Secondary | ICD-10-CM

## 2012-08-08 ENCOUNTER — Inpatient Hospital Stay (HOSPITAL_COMMUNITY)
Admission: AD | Admit: 2012-08-08 | Discharge: 2012-08-08 | Disposition: A | Discharge status: Home / Self Care | Payer: Medicaid Other | Source: Ambulatory Visit | Attending: Obstetrics and Gynecology | Admitting: Obstetrics and Gynecology

## 2012-08-08 ENCOUNTER — Encounter (HOSPITAL_COMMUNITY): Payer: Self-pay

## 2012-08-08 DIAGNOSIS — O2340 Unspecified infection of urinary tract in pregnancy, unspecified trimester: Secondary | ICD-10-CM

## 2012-08-08 DIAGNOSIS — O2390 Unspecified genitourinary tract infection in pregnancy, unspecified trimester: Secondary | ICD-10-CM

## 2012-08-08 DIAGNOSIS — O99891 Other specified diseases and conditions complicating pregnancy: Secondary | ICD-10-CM | POA: Insufficient documentation

## 2012-08-08 DIAGNOSIS — O0990 Supervision of high risk pregnancy, unspecified, unspecified trimester: Secondary | ICD-10-CM

## 2012-08-08 DIAGNOSIS — O10019 Pre-existing essential hypertension complicating pregnancy, unspecified trimester: Secondary | ICD-10-CM | POA: Insufficient documentation

## 2012-08-08 DIAGNOSIS — R1032 Left lower quadrant pain: Secondary | ICD-10-CM | POA: Insufficient documentation

## 2012-08-08 DIAGNOSIS — N949 Unspecified condition associated with female genital organs and menstrual cycle: Secondary | ICD-10-CM

## 2012-08-08 DIAGNOSIS — N390 Urinary tract infection, site not specified: Secondary | ICD-10-CM

## 2012-08-08 DIAGNOSIS — B951 Streptococcus, group B, as the cause of diseases classified elsewhere: Secondary | ICD-10-CM

## 2012-08-08 DIAGNOSIS — O09529 Supervision of elderly multigravida, unspecified trimester: Secondary | ICD-10-CM

## 2012-08-08 DIAGNOSIS — O99810 Abnormal glucose complicating pregnancy: Secondary | ICD-10-CM | POA: Insufficient documentation

## 2012-08-08 DIAGNOSIS — D259 Leiomyoma of uterus, unspecified: Secondary | ICD-10-CM

## 2012-08-08 DIAGNOSIS — O24919 Unspecified diabetes mellitus in pregnancy, unspecified trimester: Secondary | ICD-10-CM

## 2012-08-08 HISTORY — DX: Reserved for concepts with insufficient information to code with codable children: IMO0002

## 2012-08-08 HISTORY — DX: Unspecified abnormal cytological findings in specimens from cervix uteri: R87.619

## 2012-08-08 LAB — URINALYSIS, ROUTINE W REFLEX MICROSCOPIC
Bilirubin Urine: NEGATIVE
Glucose, UA: NEGATIVE mg/dL
Protein, ur: NEGATIVE mg/dL
Urobilinogen, UA: 0.2 mg/dL (ref 0.0–1.0)

## 2012-08-08 LAB — URINE MICROSCOPIC-ADD ON

## 2012-08-08 MED ORDER — IBUPROFEN 800 MG PO TABS
800.0000 mg | ORAL_TABLET | Freq: Once | ORAL | Status: AC
  Administered 2012-08-08: 800 mg via ORAL
  Filled 2012-08-08: qty 1

## 2012-08-08 NOTE — MAU Provider Note (Signed)
History     CSN: [id number]  Arrival date and time: 08/08/12 1324   First Provider Initiated Contact with Patient 08/08/12 [id number]      Chief Complaint  Patient presents with  . Abdominal Pain   HPI Carla Brooks is a 41yo G3P2002 at 20.6wks who presents for eval of LLQ cramping. Denies leak or bldg. States this 'wakes her out of her sleep.' Denies dysuria or fever. Her preg has been followed by the [facility] and has been remarkable for 1) GDM B 2) CHTN 3) fibroid uterus  OB History    Grav Para Term Preterm Abortions TAB SAB Ect Mult Living   3 2 2       2       Past Medical History  Diagnosis Date  . Diabetes mellitus   . Hypertension   . Bronchitis   . Arthritis   . GERD (gastroesophageal reflux disease)   . Asthma   . Migraines 02/21/2012  . Anemia 02/21/2012  . HTN (hypertension) 02/21/2012  . Diabetes mellitus 02/21/2012  . Abnormal Pap smear     Past Surgical History  Procedure Date  . Cholecystectomy 09/27/2001    Family History  Problem Relation Age of Onset  . Coronary artery disease Father   . Cancer Paternal Uncle     throat  . Diabetes Maternal Grandmother   . Cancer Maternal Grandfather     prostate  . Cancer Paternal Grandfather     lung    History  Substance Use Topics  . Smoking status: Never Smoker   . Smokeless tobacco: Never Used  . Alcohol Use: No    Allergies:  Allergies  Allergen Reactions  . Penicillins Hives  . Shrimp (Shellfish Allergy) Hives and Swelling    Prescriptions prior to admission  Medication Sig Dispense Refill  . ACCU-CHEK FASTCLIX LANCETS MISC 4 Units by Percutaneous route 4 (four) times daily.  100 each  12  . acetaminophen (TYLENOL) 325 MG tablet Take 325 mg by mouth every 8 (eight) hours as needed. For pain      . albuterol (PROVENTIL HFA;VENTOLIN HFA) 108 (90 BASE) MCG/ACT inhaler Inhale 2 puffs into the lungs every 6 (six) hours as needed. For wheezing      . glucose blood (ACCU-CHEK SMARTVIEW) test strip Use as  instructed  100 each  12  . insulin aspart (NOVOLOG) 100 UNIT/ML injection Inject 12 Units into the skin 3 (three) times daily before meals.  1 vial  12  . insulin NPH (HUMULIN N,NOVOLIN N) 100 UNIT/ML injection Inject 12 Units into the skin 2 (two) times daily. q am and q hs      . labetalol (NORMODYNE) 100 MG tablet TAKE 3 TABLETS (300 MG TOTAL) BY MOUTH 2 (TWO) TIMES DAILY.  60 tablet  0  . metFORMIN (GLUCOPHAGE) 1000 MG tablet Take 1,000 mg by mouth 2 (two) times daily with a meal.      . omeprazole (PRILOSEC) 20 MG capsule Take 20 mg by mouth 2 (two) times daily.      Marland Kitchen EPINEPHrine (EPIPEN) 0.3 mg/0.3 mL DEVI Inject 0.3 mLs (0.3 mg total) into the muscle as needed. Use as directed in the event of a severe allergic reaction  1 Device  3    ROS Physical Exam   Blood pressure 120/70, pulse 111, temperature 98.8 F (37.1 C), temperature source Oral, resp. rate 18, height 5\' 9"  (1.753 m), weight 90.266 kg (199 lb), last menstrual period 03/21/2012, SpO2 100.00%.  Physical Exam  Constitutional: She is oriented to person, place, and time. She appears well-developed and well-nourished.  HENT:  Head: Normocephalic.  Cardiovascular:       Sl tachycardia  Respiratory: Effort normal.  GI:       meas S>D  Genitourinary: Vagina normal.       Cx C/L  Musculoskeletal: Normal range of motion.  Neurological: She is alert and oriented to person, place, and time.  Skin: Skin is warm and dry.  Psychiatric: She has a normal mood and affect. Her behavior is normal. Thought content normal.  FHTs auscultated  Urinalysis    Component Value Date/Time   COLORURINE YELLOW 08/08/2012 0437   APPEARANCEUR CLEAR 08/08/2012 0437   LABSPEC >1.030* 08/08/2012 0437   PHURINE 6.0 08/08/2012 0437   GLUCOSEU NEGATIVE 08/08/2012 0437   HGBUR TRACE* 08/08/2012 0437   BILIRUBINUR NEGATIVE 08/08/2012 0437   KETONESUR NEGATIVE 08/08/2012 0437   PROTEINUR NEGATIVE 08/08/2012 0437   UROBILINOGEN 0.2 08/08/2012 0437    NITRITE NEGATIVE 08/08/2012 0437   LEUKOCYTESUR SMALL* 08/08/2012 0437      MAU Course  Procedures    Assessment and Plan  IUP at 20.6wks Round lig pain  Given Motrin 800mg ; encouraged to hydrate 8-10 glasses of water/day; rev'd comfort tips; F/U at next scheduled visit (has U/S at next visit).  Cam Hai 08/08/2012, 5:21 AM

## 2012-08-08 NOTE — MAU Note (Signed)
Left sided abd pain that wraps around her back & is intermittent since Wednesday. Abdomen tightens when this happens. Denies vaginal bleeding, leaking of fluid or discharge.

## 2012-08-10 ENCOUNTER — Ambulatory Visit (HOSPITAL_COMMUNITY)
Admission: RE | Admit: 2012-08-10 | Discharge: 2012-08-10 | Disposition: A | Discharge status: Home / Self Care | Payer: Medicaid Other | Source: Ambulatory Visit | Attending: Obstetrics and Gynecology | Admitting: Obstetrics and Gynecology

## 2012-08-10 ENCOUNTER — Other Ambulatory Visit (HOSPITAL_COMMUNITY): Payer: Self-pay | Admitting: Maternal and Fetal Medicine

## 2012-08-10 DIAGNOSIS — O09529 Supervision of elderly multigravida, unspecified trimester: Secondary | ICD-10-CM

## 2012-08-10 DIAGNOSIS — O10019 Pre-existing essential hypertension complicating pregnancy, unspecified trimester: Secondary | ICD-10-CM | POA: Insufficient documentation

## 2012-08-10 DIAGNOSIS — IMO0001 Reserved for inherently not codable concepts without codable children: Secondary | ICD-10-CM

## 2012-08-10 DIAGNOSIS — O24919 Unspecified diabetes mellitus in pregnancy, unspecified trimester: Secondary | ICD-10-CM | POA: Insufficient documentation

## 2012-08-10 NOTE — Consult Note (Signed)
Dear  Colleagues,  I had the pleasure of seeing Carla Brooks for consultation in the Fetal Heart Program at [facility]. As you know, she is a 41 y.o. G3P2 currently at 21 weeks 1 day gestation referred for fetal cardiovascular evaluation secondary to maternal pre-gestational diabetes and hypertension.  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  This pregnancy was conceived naturally without fertility drugs. There has been no indications for amniocentesis. She has not experienced any significant intercurrent illnesses or hospitalizations. She is on prenatal vitamins, insulin and labetolol (for hypertension). By mother's report, her sugar control was somewhat poor early in pregnancy (based on fact that her insulin had to be increased).  She does not know if she ever had a first-trimester HgA1C (and I cannot find records of that at this time).  She denies any exposure to tobacco, alcohol or recreational drugs. She denies any complaints today.  All systems reviewed and negative other than listed in HPI.  There is no family history of congenital heart disease, birth defects, genetic abnormalities, arrhythmia, pacemakers or sudden cardiac death.    A complete Fetal Echocardiogram with Doppler assessment was performed in our clinic today, which I personally interpreted and reviewed with Carla Brooks.  Please refer to a copy of my final report in [facility] if you would like to review further details.  We did not identify any significant intracardiac abnormalities.  There was normal Doppler patterns in the umbilical vessels and middle cerebral artery. Fetal heart rate was in normal range and rhythm was normal without ectopy.  In summary, I felt that this Fetal Echo was completely normal. These findings were discussed with Carla Brooks.  I also told her that fetal echocardiography may not be able to detect minor  abnormalities such as small ventricular septal defects, subtle valve abnormalities or mild coarctation of the aorta. Nor can it predict postnatal persistence of normal fetal structures such as a patent foramen ovale or patent ductus arteriosus.  I do not feel that Akeylah requires a follow-up Fetal Echo appointment in our [facility] here at [facility].  But I am glad to see her back if you have any persistent concerns or if new clinical problems arise. I also do not feel that an echocardiogram is necessary after delivery unless indicated by clinical conditions. Please do not hesitate to contact me with any questions or concerns  Thank you for involving me in the care of your patient today.   Sincerely,  Massie Bougie, MD Pediatric Cardiology [facility]  Total Time: 30 minues. Time of Counseling/Coordinating care: 20 minutes.

## 2012-08-12 NOTE — MAU Provider Note (Signed)
Attestation of Attending Supervision of Advanced Practitioner: Evaluation and management procedures were performed by the PA/NP/CNM/OB Fellow under my supervision/collaboration. Chart reviewed and agree with management and plan.  Tilda Burrow 08/12/2012 7:49 PM

## 2012-08-17 ENCOUNTER — Ambulatory Visit (INDEPENDENT_AMBULATORY_CARE_PROVIDER_SITE_OTHER): Payer: Medicaid Other | Admitting: Family

## 2012-08-17 VITALS — BP 115/79 | Temp 97.1°F | Wt 191.0 lb

## 2012-08-17 DIAGNOSIS — O24919 Unspecified diabetes mellitus in pregnancy, unspecified trimester: Secondary | ICD-10-CM

## 2012-08-17 DIAGNOSIS — O0990 Supervision of high risk pregnancy, unspecified, unspecified trimester: Secondary | ICD-10-CM

## 2012-08-17 LAB — POCT URINALYSIS DIP (DEVICE)
Glucose, UA: NEGATIVE mg/dL
Nitrite: NEGATIVE
Specific Gravity, Urine: 1.025 (ref 1.005–1.030)
Urobilinogen, UA: 0.2 mg/dL (ref 0.0–1.0)

## 2012-08-17 NOTE — Progress Notes (Signed)
Pt reports being seen in MAU, dx with round ligament pain; reports intermittent pain in left groin, no bleeding or leaking of fluid > provided reassurance; FBS 80-116 (7/13 abnml), B 64-113, L 67-143 (4/11), D 89-139 (5/10); increase NPH HS to 14; next MFM [place] 12/26; reviewed echo results.

## 2012-08-17 NOTE — Progress Notes (Signed)
Pulse 112  C/o pressure on left groin; no pain.

## 2012-08-27 ENCOUNTER — Ambulatory Visit (HOSPITAL_COMMUNITY)
Admission: RE | Admit: 2012-08-27 | Discharge: 2012-08-27 | Disposition: A | Discharge status: Home / Self Care | Payer: Medicaid Other | Source: Ambulatory Visit | Attending: Obstetrics and Gynecology | Admitting: Obstetrics and Gynecology

## 2012-08-27 VITALS — BP 138/85 | HR 122 | Wt 201.5 lb

## 2012-08-27 DIAGNOSIS — O09529 Supervision of elderly multigravida, unspecified trimester: Secondary | ICD-10-CM | POA: Insufficient documentation

## 2012-08-27 DIAGNOSIS — O24919 Unspecified diabetes mellitus in pregnancy, unspecified trimester: Secondary | ICD-10-CM | POA: Insufficient documentation

## 2012-08-27 DIAGNOSIS — D259 Leiomyoma of uterus, unspecified: Secondary | ICD-10-CM

## 2012-08-27 DIAGNOSIS — O3410 Maternal care for benign tumor of corpus uteri, unspecified trimester: Secondary | ICD-10-CM | POA: Insufficient documentation

## 2012-08-27 DIAGNOSIS — O10019 Pre-existing essential hypertension complicating pregnancy, unspecified trimester: Secondary | ICD-10-CM | POA: Insufficient documentation

## 2012-08-27 DIAGNOSIS — R9389 Abnormal findings on diagnostic imaging of other specified body structures: Secondary | ICD-10-CM | POA: Insufficient documentation

## 2012-08-27 DIAGNOSIS — O0990 Supervision of high risk pregnancy, unspecified, unspecified trimester: Secondary | ICD-10-CM

## 2012-08-27 DIAGNOSIS — O358XX0 Maternal care for other (suspected) fetal abnormality and damage, not applicable or unspecified: Secondary | ICD-10-CM | POA: Insufficient documentation

## 2012-08-27 DIAGNOSIS — O2340 Unspecified infection of urinary tract in pregnancy, unspecified trimester: Secondary | ICD-10-CM

## 2012-08-27 DIAGNOSIS — O30109 Triplet pregnancy, unspecified number of placenta and unspecified number of amniotic sacs, unspecified trimester: Secondary | ICD-10-CM | POA: Insufficient documentation

## 2012-08-27 NOTE — Progress Notes (Signed)
Carla Brooks  was seen today for an ultrasound appointment.  See full report in [facility].  Impression: Single IUP at 19 3/7 weeks s/p early loss of triplets (Baby A / B) Echogenic intracardiac focus noted in the left ventcle. 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  Recommendations: Recommend follow up ultrasound in 4 weeks for interval growth.  Alpha Gula, MD

## 2012-08-27 NOTE — Addendum Note (Signed)
Encounter addended by: Ty Hilts, RN on: 08/27/2012  1:21 PM<BR>     Documentation filed: Charges VN

## 2012-08-28 ENCOUNTER — Other Ambulatory Visit: Payer: Self-pay | Admitting: Family Medicine

## 2012-08-28 ENCOUNTER — Other Ambulatory Visit: Payer: Self-pay | Admitting: Obstetrics and Gynecology

## 2012-08-31 ENCOUNTER — Encounter: Payer: Medicaid Other | Admitting: Obstetrics and Gynecology

## 2012-09-07 ENCOUNTER — Encounter: Payer: Medicaid Other | Admitting: Obstetrics and Gynecology

## 2012-09-14 ENCOUNTER — Ambulatory Visit (INDEPENDENT_AMBULATORY_CARE_PROVIDER_SITE_OTHER): Payer: Medicaid Other | Admitting: Obstetrics and Gynecology

## 2012-09-14 VITALS — BP 160/99 | Temp 97.7°F | Wt 196.4 lb

## 2012-09-14 DIAGNOSIS — O10019 Pre-existing essential hypertension complicating pregnancy, unspecified trimester: Secondary | ICD-10-CM

## 2012-09-14 DIAGNOSIS — O24919 Unspecified diabetes mellitus in pregnancy, unspecified trimester: Secondary | ICD-10-CM

## 2012-09-14 LAB — CBC
Hemoglobin: 9.1 g/dL — ABNORMAL LOW (ref 12.0–15.0)
MCV: 84.5 fL (ref 78.0–100.0)
Platelets: 346 10*3/uL (ref 150–400)
RBC: 3.28 MIL/uL — ABNORMAL LOW (ref 3.87–5.11)
WBC: 9.8 10*3/uL (ref 4.0–10.5)

## 2012-09-14 LAB — POCT URINALYSIS DIP (DEVICE)
Bilirubin Urine: NEGATIVE
Glucose, UA: NEGATIVE mg/dL
Ketones, ur: NEGATIVE mg/dL
Nitrite: NEGATIVE

## 2012-09-14 LAB — RPR

## 2012-09-14 NOTE — Progress Notes (Signed)
Did not bring CBG log, needs new one (did not record sugars this week. Pt states fasting CBGs 60-82, PP 99-107. Taking Novolog 12 tid, NPH 12 bid. BP high initially, repeat 143/93. On Labetalol 300 BID. Took dose at 7 AM (first BP at 9 AM). No HA, vision changes, RUQ pain or proteinuria.  Will monitor BP next visit. Will likely need to increase dose. Normal U/S 08/27/12 with MFM. F/U U/S 1/23.

## 2012-09-14 NOTE — Progress Notes (Signed)
Pulse- 99  Patient reports abdominal pain and some vaginal pain over the weekend

## 2012-09-24 ENCOUNTER — Ambulatory Visit (HOSPITAL_COMMUNITY): Admission: RE | Admit: 2012-09-24 | Payer: Medicaid Other | Source: Ambulatory Visit

## 2012-09-28 ENCOUNTER — Encounter: Payer: Medicaid Other | Attending: Family Medicine | Admitting: Dietician

## 2012-09-28 ENCOUNTER — Ambulatory Visit (INDEPENDENT_AMBULATORY_CARE_PROVIDER_SITE_OTHER): Payer: Medicaid Other | Admitting: Family Medicine

## 2012-09-28 VITALS — BP 135/89 | Temp 97.4°F | Wt 194.5 lb

## 2012-09-28 DIAGNOSIS — O99810 Abnormal glucose complicating pregnancy: Secondary | ICD-10-CM | POA: Insufficient documentation

## 2012-09-28 DIAGNOSIS — I10 Essential (primary) hypertension: Secondary | ICD-10-CM

## 2012-09-28 DIAGNOSIS — E119 Type 2 diabetes mellitus without complications: Secondary | ICD-10-CM

## 2012-09-28 DIAGNOSIS — O10019 Pre-existing essential hypertension complicating pregnancy, unspecified trimester: Secondary | ICD-10-CM

## 2012-09-28 DIAGNOSIS — O09529 Supervision of elderly multigravida, unspecified trimester: Secondary | ICD-10-CM

## 2012-09-28 DIAGNOSIS — Z713 Dietary counseling and surveillance: Secondary | ICD-10-CM | POA: Insufficient documentation

## 2012-09-28 LAB — POCT URINALYSIS DIP (DEVICE)
Glucose, UA: NEGATIVE mg/dL
Specific Gravity, Urine: >=1.03 (ref 1.005–1.030)

## 2012-09-28 NOTE — Progress Notes (Signed)
Did not bring blood sugar log. Taking NPH 12 bid, novolog 12 tid with meals.  Per pt, fasting - 70-82. PP lunch, dinner - 100s. Bedtime 150s (but checking 1 hour after snack). Eating fruit at night for snack. No change in insulin. Bring log next visit.  BP - on labetalol 300 BID. Took at 7 AM today. BP much better today than last visit. Had HA all weekend, resolved now. Has hx migraines. Had vision changes during migraine, none today. No edema. Pain/pressure in abdomen/ribs/lower back - muscular, suggested tylenol. F/U growth sono tomorrow. Wt loss to 194 today (2 lb)- to see nutritionist.

## 2012-09-28 NOTE — Progress Notes (Signed)
Nutrition Note: f/u for wt loss Pt here for 2# wt loss since 2 weeks ago. Pt has gained 7.5# overall @ [redacted]w[redacted]d, which is < recommended. Pt reports eating Breakfast, Lunch, snack, Dinner & snack. Pt reports snack @ 8pm is fruit & then when she checks her BS 1hr later, they are running 150-180. Pt did not bring her log but reports fasting BS: 67-72, 2hr pp: 88-120.  Pt is not taking PNV (reports she was never told to take one). Pt reports walking occ but gets really tired after walking 1 block. Pt reports nausea a few days a week but bad heartburn & that her Prilosec is not helping. Pt received verbal & written education on tips for gaining more weight (energy dense snacks) & tips to decrease heartburn.  Also encouraged pt to change bed time snack to nuts or cheese with minimal CHO (whole wheat crackers, milk) & to check BS 2hr later, not 1hr. Encouraged pt to start taking a PNV. Disc wt gain goals of 15-25# or 0.6#/ wk. F/u 2-4 wks Blondell Reveal, MS, RD, LDN

## 2012-09-28 NOTE — Progress Notes (Signed)
Patient states she has a lot of cramping and pain in her lower abdomen, back, and ribs.   p=100

## 2012-09-28 NOTE — Progress Notes (Signed)
Diabetes Education:  Noted to be having weight loss and high sugars at night.  She does not have meter or glucose log today.  Request she bring both to her next visit.  By report, her fasting and post-meal levels are with a normal range.  However, she is having a bedtime snack of fruit and then checking immediately after the snack.  She is to use the snack suggested by the Nutritionist and not check her level immediately after the snack.  Will examine glucose levels at next visit and monitor weight level.  Maggie Jalyiah Shelley, RN, RD, CDE

## 2012-09-28 NOTE — Patient Instructions (Addendum)
Hypertension During Pregnancy Hypertension is also called high blood pressure. It can occur at any time in life and during pregnancy. When you have hypertension, there is extra pressure inside your blood vessels that carry blood from the heart to the rest of your body (arteries). Hypertension during pregnancy can cause problems for you and your baby. Your baby might not weigh as much as it should at birth or might be born early (premature). Very bad cases of hypertension during pregnancy can be life-threatening.  There are different types of hypertension during pregnancy.   Chronic hypertension. This happens when a woman has hypertension before pregnancy and it continues during pregnancy.  Gestational hypertension. This is when hypertension develops during pregnancy.  Preeclampsia or toxemia of pregnancy. This is a very serious type of hypertension that develops only during pregnancy. It is a disease that affects the whole body (systemic) and can be very dangerous for both mother and baby.  Gestational hypertension and preeclampsia usually go away after your baby is born. Blood pressure generally stabilizes within 6 weeks. Women who have hypertension during pregnancy have a greater chance of developing hypertension later in life or with future pregnancies. UNDERSTANDING BLOOD PRESSURE Blood pressure moves blood in your body. Sometimes, the force that moves the blood becomes too strong.  A blood pressure reading is given in 2 numbers and looks like a fraction.  The top number is called the systolic pressure. When your heart beats, it forces more blood to flow through the arteries. Pressure inside the arteries goes up.  The bottom number is the diastolic pressure. Pressure goes down between beats. That is when the heart is resting.  You may have hypertension if:  Your systolic blood pressure is above 140.  Your diastolic pressure is above 90. RISK FACTORS Some factors make you more likely to  develop hypertension during pregnancy. Risk factors include:  Having hypertension before pregnancy.  Having hypertension during a previous pregnancy.  Being overweight.  Being older than 40.  Being pregnant with more than 1 baby (multiples).  Having diabetes or kidney problems. SYMPTOMS Chronic and gestational hypertension may not cause symptoms. Preeclampsia has symptoms, which may include:  Increased protein in your urine. Your caregiver will check for this at every prenatal visit.  Swelling of your hands and face.  Rapid weight gain.  Headaches.  Visual changes.  Being bothered by light.  Abdominal pain, especially in the right upper area.  Chest pain.  Shortness of breath.  Increased reflexes.  Seizures. Seizures occur with a more severe form of preeclampsia, called eclampsia. DIAGNOSIS   You may be diagnosed with hypertension during pregnancy during a regular prenatal exam. At each visit, tests may include:  Blood pressure checks.  A urine test to check for protein in your urine.  The type of hypertension you are diagnosed with depends on when you developed it. It also depends on your specific blood pressure reading.  Developing hypertension before 20 weeks of pregnancy is consistent with chronic hypertension.  Developing hypertension after 20 weeks of pregnancy is consistent with gestational hypertension.  Hypertension with increased urinary protein is diagnosed as preeclampsia.  Blood pressure measurements that stay above 160 systolic or 110 diastolic are a sign of severe preeclampsia. TREATMENT Treatment for hypertension during pregnancy varies. Treatment depends on the type of hypertension and how serious it is.  If you take medicine for chronic hypertension, you may need to switch medicines.  Drugs called ACE inhibitors should not be taken during pregnancy.    Low-dose aspirin may be suggested for women who have risk factors for preeclampsia.  If  you have gestational hypertension, you may need to take a blood pressure medicine that is safe during pregnancy. Your caregiver will recommend the appropriate medicine.  If you have severe preeclampsia, you may need to be in the hospital. Caregivers will watch you and the baby very closely. You also may need to take medicine (magnesium sulfate) to prevent seizures and lower blood pressure.  Sometimes an early delivery is needed. This may be the case if the condition worsens. It would be done to protect you and the baby. The only cure for preeclampsia is delivery. HOME CARE INSTRUCTIONS  Schedule and keep all of your regular prenatal care.  Follow your caregiver's instructions for taking medicines. Tell your caregiver about all medicines you take. This includes over-the-counter medicines.  Eat as little salt as possible.  Get regular exercise.  Do not drink alcohol.  Do not use tobacco products.  Do not drink products with caffeine.  Lie on your left side when resting.  Tell your doctor if you have any preeclampsia symptoms. SEEK IMMEDIATE MEDICAL CARE IF:  You have severe abdominal pain.  You have sudden swelling in the hands, ankles, or face.  You gain 4 pounds (1.8 kg) or more in 1 week.  You vomit repeatedly.  You have vaginal bleeding.  You do not feel the baby moving as much.  You have a headache.  You have blurred or double vision.  You have muscle twitching or spasms.  You have shortness of breath.  You have blue fingernails and lips.  You have blood in your urine. MAKE SURE YOU:  Understand these instructions.  Will watch your condition.  Will get help right away if you are not doing well. Document Released: 05/07/2011 Document Revised: 11/11/2011 Document Reviewed: 05/07/2011 [facility] Patient Information 2013 [place], [place].  Pregnancy - Third Trimester The third trimester of pregnancy (the last 3 months) is a period of the most rapid growth for  you and your baby. The baby approaches a length of 20 inches and a weight of 6 to 10 pounds. The baby is adding on fat and getting ready for life outside your body. While inside, babies have periods of sleeping and waking, suck their thumbs, and hiccups. You can often feel small contractions of the uterus. This is false labor. It is also called Braxton-Hicks contractions. This is like a practice for labor. The usual problems in this stage of pregnancy include more difficulty breathing, swelling of the hands and feet from water retention, and having to urinate more often because of the uterus and baby pressing on your bladder.  PRENATAL EXAMS  Blood work may continue to be done during prenatal exams. These tests are done to check on your health and the probable health of your baby. Blood work is used to follow your blood levels (hemoglobin). Anemia (low hemoglobin) is common during pregnancy. Iron and vitamins are given to help prevent this. You may also continue to be checked for diabetes. Some of the past blood tests may be done again.  The size of the uterus is measured during each visit. This makes sure your baby is growing properly according to your pregnancy dates.  Your blood pressure is checked every prenatal visit. This is to make sure you are not getting toxemia.  Your urine is checked every prenatal visit for infection, diabetes and protein.  Your weight is checked at each visit. This is  done to make sure gains are happening at the suggested rate and that you and your baby are growing normally.  Sometimes, an ultrasound is performed to confirm the position and the proper growth and development of the baby. This is a test done that bounces harmless sound waves off the baby so your caregiver can more accurately determine due dates.  Discuss the type of pain medication and anesthesia you will have during your labor and delivery.  Discuss the possibility and anesthesia if a Cesarean Section  might be necessary.  Inform your caregiver if there is any mental or physical violence at home. Sometimes, a specialized non-stress test, contraction stress test and biophysical profile are done to make sure the baby is not having a problem. Checking the amniotic fluid surrounding the baby is called an amniocentesis. The amniotic fluid is removed by sticking a needle into the belly (abdomen). This is sometimes done near the end of pregnancy if an early delivery is required. In this case, it is done to help make sure the baby's lungs are mature enough for the baby to live outside of the womb. If the lungs are not mature and it is unsafe to deliver the baby, an injection of cortisone medication is given to the mother 1 to 2 days before the delivery. This helps the baby's lungs mature and makes it safer to deliver the baby. CHANGES OCCURING IN THE THIRD TRIMESTER OF PREGNANCY Your body goes through many changes during pregnancy. They vary from person to person. Talk to your caregiver about changes you notice and are concerned about.  During the last trimester, you have probably had an increase in your appetite. It is normal to have cravings for certain foods. This varies from person to person and pregnancy to pregnancy.  You may begin to get stretch marks on your hips, abdomen, and breasts. These are normal changes in the body during pregnancy. There are no exercises or medications to take which prevent this change.  Constipation may be treated with a stool softener or adding bulk to your diet. Drinking lots of fluids, fiber in vegetables, fruits, and whole grains are helpful.  Exercising is also helpful. If you have been very active up until your pregnancy, most of these activities can be continued during your pregnancy. If you have been less active, it is helpful to start an exercise program such as walking. Consult your caregiver before starting exercise programs.  Avoid all smoking, alcohol,  un-prescribed drugs, herbs and "street drugs" during your pregnancy. These chemicals affect the formation and growth of the baby. Avoid chemicals throughout the pregnancy to ensure the delivery of a healthy infant.  Backache, varicose veins and hemorrhoids may develop or get worse.  You will tire more easily in the third trimester, which is normal.  The baby's movements may be stronger and more often.  You may become short of breath easily.  Your belly button may stick out.  A yellow discharge may leak from your breasts called colostrum.  You may have a bloody mucus discharge. This usually occurs a few days to a week before labor begins. HOME CARE INSTRUCTIONS   Keep your caregiver's appointments. Follow your caregiver's instructions regarding medication use, exercise, and diet.  During pregnancy, you are providing food for you and your baby. Continue to eat regular, well-balanced meals. Choose foods such as meat, fish, milk and other low fat dairy products, vegetables, fruits, and whole-grain breads and cereals. Your caregiver will tell you of the ideal  weight gain.  A physical sexual relationship may be continued throughout pregnancy if there are no other problems such as early (premature) leaking of amniotic fluid from the membranes, vaginal bleeding, or belly (abdominal) pain.  Exercise regularly if there are no restrictions. Check with your caregiver if you are unsure of the safety of your exercises. Greater weight gain will occur in the last 2 trimesters of pregnancy. Exercising helps:  Control your weight.  Get you in shape for labor and delivery.  You lose weight after you deliver.  Rest a lot with legs elevated, or as needed for leg cramps or low back pain.  Wear a good support or jogging bra for breast tenderness during pregnancy. This may help if worn during sleep. Pads or tissues may be used in the bra if you are leaking colostrum.  Do not use hot tubs, steam rooms, or  saunas.  Wear your seat belt when driving. This protects you and your baby if you are in an accident.  Avoid raw meat, cat litter boxes and soil used by cats. These carry germs that can cause birth defects in the baby.  It is easier to loose urine during pregnancy. Tightening up and strengthening the pelvic muscles will help with this problem. You can practice stopping your urination while you are going to the bathroom. These are the same muscles you need to strengthen. It is also the muscles you would use if you were trying to stop from passing gas. You can practice tightening these muscles up 10 times a set and repeating this about 3 times per day. Once you know what muscles to tighten up, do not perform these exercises during urination. It is more likely to cause an infection by backing up the urine.  Ask for help if you have financial, counseling or nutritional needs during pregnancy. Your caregiver will be able to offer counseling for these needs as well as refer you for other special needs.  Make a list of emergency phone numbers and have them available.  Plan on getting help from family or friends when you go home from the hospital.  Make a trial run to the hospital.  Take prenatal classes with the father to understand, practice and ask questions about the labor and delivery.  Prepare the baby's room/nursery.  Do not travel out of the city unless it is absolutely necessary and with the advice of your caregiver.  Wear only low or no heal shoes to have better balance and prevent falling. MEDICATIONS AND DRUG USE IN PREGNANCY  Take prenatal vitamins as directed. The vitamin should contain 1 milligram of folic acid. Keep all vitamins out of reach of children. Only a couple vitamins or tablets containing iron may be fatal to a baby or young child when ingested.  Avoid use of all medications, including herbs, over-the-counter medications, not prescribed or suggested by your caregiver.  Only take over-the-counter or prescription medicines for pain, discomfort, or fever as directed by your caregiver. Do not use aspirin, ibuprofen (Motrin, Advil, Nuprin) or naproxen (Aleve) unless OK'd by your caregiver.  Let your caregiver also know about herbs you may be using.  Alcohol is related to a number of birth defects. This includes fetal alcohol syndrome. All alcohol, in any form, should be avoided completely. Smoking will cause low birth rate and premature babies.  Street/illegal drugs are very harmful to the baby. They are absolutely forbidden. A baby born to an addicted mother will be addicted at birth. The baby  will go through the same withdrawal an adult does. SEEK MEDICAL CARE IF: You have any concerns or worries during your pregnancy. It is better to call with your questions if you feel they cannot wait, rather than worry about them. DECISIONS ABOUT CIRCUMCISION You may or may not know the sex of your baby. If you know your baby is a boy, it may be time to think about circumcision. Circumcision is the removal of the foreskin of the penis. This is the skin that covers the sensitive end of the penis. There is no proven medical need for this. Often this decision is made on what is popular at the time or based upon religious beliefs and social issues. You can discuss these issues with your caregiver or pediatrician. SEEK IMMEDIATE MEDICAL CARE IF:   An unexplained oral temperature above 102 F (38.9 C) develops, or as your caregiver suggests.  You have leaking of fluid from the vagina (birth canal). If leaking membranes are suspected, take your temperature and tell your caregiver of this when you call.  There is vaginal spotting, bleeding or passing clots. Tell your caregiver of the amount and how many pads are used.  You develop a bad smelling vaginal discharge with a change in the color from clear to white.  You develop vomiting that lasts more than 24 hours.  You develop  chills or fever.  You develop shortness of breath.  You develop burning on urination.  You loose more than 2 pounds of weight or gain more than 2 pounds of weight or as suggested by your caregiver.  You notice sudden swelling of your face, hands, and feet or legs.  You develop belly (abdominal) pain. Round ligament discomfort is a common non-cancerous (benign) cause of abdominal pain in pregnancy. Your caregiver still must evaluate you.  You develop a severe headache that does not go away.  You develop visual problems, blurred or double vision.  If you have not felt your baby move for more than 1 hour. If you think the baby is not moving as much as usual, eat something with sugar in it and lie down on your left side for an hour. The baby should move at least 4 to 5 times per hour. Call right away if your baby moves less than that.  You fall, are in a car accident or any kind of trauma.  There is mental or physical violence at home. Document Released: 08/13/2001 Document Revised: 11/11/2011 Document Reviewed: 02/15/2009 [facility] Patient Information 2013 [place], [place].

## 2012-09-29 ENCOUNTER — Ambulatory Visit (HOSPITAL_COMMUNITY)
Admission: RE | Admit: 2012-09-29 | Discharge: 2012-09-29 | Disposition: A | Discharge status: Home / Self Care | Payer: Medicaid Other | Source: Ambulatory Visit | Attending: Obstetrics and Gynecology | Admitting: Obstetrics and Gynecology

## 2012-09-29 VITALS — BP 139/97 | HR 89 | Wt 193.5 lb

## 2012-09-29 DIAGNOSIS — E119 Type 2 diabetes mellitus without complications: Secondary | ICD-10-CM

## 2012-09-29 DIAGNOSIS — O24919 Unspecified diabetes mellitus in pregnancy, unspecified trimester: Secondary | ICD-10-CM

## 2012-09-29 DIAGNOSIS — O3410 Maternal care for benign tumor of corpus uteri, unspecified trimester: Secondary | ICD-10-CM | POA: Insufficient documentation

## 2012-09-29 DIAGNOSIS — O30109 Triplet pregnancy, unspecified number of placenta and unspecified number of amniotic sacs, unspecified trimester: Secondary | ICD-10-CM | POA: Insufficient documentation

## 2012-09-29 DIAGNOSIS — O09529 Supervision of elderly multigravida, unspecified trimester: Secondary | ICD-10-CM

## 2012-09-29 DIAGNOSIS — D259 Leiomyoma of uterus, unspecified: Secondary | ICD-10-CM

## 2012-09-29 DIAGNOSIS — B951 Streptococcus, group B, as the cause of diseases classified elsewhere: Secondary | ICD-10-CM

## 2012-09-29 DIAGNOSIS — I10 Essential (primary) hypertension: Secondary | ICD-10-CM

## 2012-09-29 DIAGNOSIS — O0990 Supervision of high risk pregnancy, unspecified, unspecified trimester: Secondary | ICD-10-CM

## 2012-09-29 DIAGNOSIS — O358XX0 Maternal care for other (suspected) fetal abnormality and damage, not applicable or unspecified: Secondary | ICD-10-CM | POA: Insufficient documentation

## 2012-09-29 DIAGNOSIS — O2340 Unspecified infection of urinary tract in pregnancy, unspecified trimester: Secondary | ICD-10-CM

## 2012-09-29 DIAGNOSIS — O10019 Pre-existing essential hypertension complicating pregnancy, unspecified trimester: Secondary | ICD-10-CM

## 2012-10-12 ENCOUNTER — Encounter: Payer: Medicaid Other | Admitting: Family Medicine

## 2012-10-14 ENCOUNTER — Telehealth: Payer: Self-pay | Admitting: Nurse Practitioner

## 2012-10-14 ENCOUNTER — Encounter (HOSPITAL_COMMUNITY): Payer: Self-pay | Admitting: *Deleted

## 2012-10-14 ENCOUNTER — Inpatient Hospital Stay (HOSPITAL_COMMUNITY): Payer: Medicaid Other

## 2012-10-14 ENCOUNTER — Inpatient Hospital Stay (HOSPITAL_COMMUNITY)
Admission: AD | Admit: 2012-10-14 | Discharge: 2012-10-14 | Disposition: A | Discharge status: Home / Self Care | Payer: Medicaid Other | Source: Ambulatory Visit | Attending: Obstetrics & Gynecology | Admitting: Obstetrics & Gynecology

## 2012-10-14 DIAGNOSIS — O24919 Unspecified diabetes mellitus in pregnancy, unspecified trimester: Secondary | ICD-10-CM | POA: Insufficient documentation

## 2012-10-14 DIAGNOSIS — O2243 Hemorrhoids in pregnancy, third trimester: Secondary | ICD-10-CM

## 2012-10-14 DIAGNOSIS — K649 Unspecified hemorrhoids: Secondary | ICD-10-CM | POA: Insufficient documentation

## 2012-10-14 DIAGNOSIS — O228X9 Other venous complications in pregnancy, unspecified trimester: Secondary | ICD-10-CM

## 2012-10-14 DIAGNOSIS — O139 Gestational [pregnancy-induced] hypertension without significant proteinuria, unspecified trimester: Secondary | ICD-10-CM | POA: Insufficient documentation

## 2012-10-14 DIAGNOSIS — E119 Type 2 diabetes mellitus without complications: Secondary | ICD-10-CM | POA: Insufficient documentation

## 2012-10-14 HISTORY — DX: Benign neoplasm of connective and other soft tissue, unspecified: D21.9

## 2012-10-14 LAB — COMPREHENSIVE METABOLIC PANEL
ALT: 11 U/L (ref 0–35)
BUN: 10 mg/dL (ref 6–23)
CO2: 21 mEq/L (ref 19–32)
Calcium: 8.9 mg/dL (ref 8.4–10.5)
Creatinine, Ser: 0.87 mg/dL (ref 0.50–1.10)
GFR calc Af Amer: >90 mL/min (ref >90)
GFR calc non Af Amer: 83 mL/min — ABNORMAL LOW (ref >90)
Glucose, Bld: 91 mg/dL (ref 70–99)
Total Protein: 7 g/dL (ref 6.0–8.3)

## 2012-10-14 LAB — CBC
HCT: 27.1 % — ABNORMAL LOW (ref 36.0–46.0)
Hemoglobin: 8.7 g/dL — ABNORMAL LOW (ref 12.0–15.0)
MCH: 27.7 pg (ref 26.0–34.0)
MCHC: 32.1 g/dL (ref 30.0–36.0)
MCV: 86.3 fL (ref 78.0–100.0)
RBC: 3.14 MIL/uL — ABNORMAL LOW (ref 3.87–5.11)

## 2012-10-14 LAB — TYPE AND SCREEN: ABO/RH(D): A POS

## 2012-10-14 LAB — PROTEIN / CREATININE RATIO, URINE
Creatinine, Urine: 113.52 mg/dL
Protein Creatinine Ratio: 0.24 — ABNORMAL HIGH (ref 0.00–0.15)
Total Protein, Urine: 27.6 mg/dL

## 2012-10-14 MED ORDER — HYDROCORTISONE ACE-PRAMOXINE 1-1 % RE CREA: TOPICAL_CREAM | Freq: Three times a day (TID) | RECTAL | Status: DC

## 2012-10-14 MED ORDER — SODIUM CHLORIDE 0.9 % IV SOLN
INTRAVENOUS | Status: DC
  Administered 2012-10-14: 02:00:00 via INTRAVENOUS

## 2012-10-14 MED ORDER — LABETALOL HCL 100 MG PO TABS: 500.0000 mg | ORAL_TABLET | Freq: Two times a day (BID) | ORAL | Status: DC

## 2012-10-14 MED ORDER — LABETALOL HCL 5 MG/ML IV SOLN
20.0000 mg | INTRAVENOUS | Status: DC | PRN
  Administered 2012-10-14: 20 mg via INTRAVENOUS
  Filled 2012-10-14: qty 4

## 2012-10-14 NOTE — MAU Provider Note (Signed)
Attestation of Attending Supervision of Advanced Practitioner (PA/CNM/NP): Evaluation and management procedures were performed by the Advanced Practitioner under my supervision and collaboration.  I have reviewed the Advanced Practitioner's note and chart, and I agree with the management and plan.  Jenaya Saar, MD, FACOG Attending Obstetrician & Gynecologist Faculty Practice, [facility]  

## 2012-10-14 NOTE — MAU Note (Signed)
Pt arrives via EMS with complaint of episode of bleeding tonight, B/P in route elevated.

## 2012-10-14 NOTE — MAU Note (Signed)
Patient refuses cath urine would prefer a clean catch.

## 2012-10-14 NOTE — Telephone Encounter (Signed)
At the pharmacy - [facility] - and the cream prescribed is not covered by Medicaid.  Client unable to afford the $68 medication.  Called and talked with pharmacist.  Changed medication to from Proctocream to [facility] with instructions to apply TID.  Proctosol covered by Medicaid.  Called client and informed her of the change.

## 2012-10-14 NOTE — MAU Provider Note (Signed)
Carla Brooks is a 42 y.o. female G3P2002 at 30.3wks presenting for eval of noting blood in toilet this evening. She thought she sat to have a bowel movement but only noted blood afterwards. Has had a flare up of hemorrhoids x 3d. Reports +FM. Denies leaking fluid. Had some vag bldg earlier in preg with the loss of two of the triplets (now with singleton preg) but no bldg since that time in December. History OB History   Grav Para Term Preterm Abortions TAB SAB Ect Mult Living   3 2 2       2      Past Medical History  Diagnosis Date  . Diabetes mellitus   . Hypertension   . Bronchitis   . Arthritis   . GERD (gastroesophageal reflux disease)   . Asthma   . Migraines 02/21/2012  . Anemia 02/21/2012  . HTN (hypertension) 02/21/2012  . Diabetes mellitus 02/21/2012  . Abnormal Pap smear   . Fibroid    Past Surgical History  Procedure Laterality Date  . Cholecystectomy  09/27/2001   Family History: family history includes Cancer in her maternal grandfather, paternal grandfather, and paternal uncle; Coronary artery disease in her father; and Diabetes in her maternal grandmother. Social History:  reports that she has never smoked. She has never used smokeless tobacco. She reports that she does not drink alcohol or use illicit drugs.    ROS  Dilation: Fingertip Effacement (%): Thick Station: -3 Exam by:: Philipp Deputy CNM Blood pressure 151/77, pulse 89, temperature 98.9 F (37.2 C), temperature source Oral, resp. rate 20, height 5\' 9"  (1.753 m), weight 191 lb (86.637 kg), last menstrual period 03/21/2012.  Initial BPs elevated: 168/106 requiring IV Labetalol with subsequent BPs 145-150/75-85 Maternal Exam:  Uterine Assessment: No ctx per toco  Cervix: Cx FT/thick/high  Fetal Exam Fetal Monitor Review: Baseline rate: 140.  Variability: moderate (6-25 bpm).   Pattern: accelerations present and no decelerations.       Physical Exam  Constitutional: She is oriented to person,  place, and time. She appears well-developed.  HENT:  Head: Normocephalic.  Cardiovascular: Normal rate.   Respiratory: Effort normal.  Genitourinary:  Nl vag with creamy white d/c in vault; no evidence of blood noted; rectum with approx 4cm hemorrhoid and sm spot of blood noted on hemorrhoid  Musculoskeletal: Normal range of motion.  Neurological: She is alert and oriented to person, place, and time.  Skin: Skin is warm and dry.  Psychiatric: She has a normal mood and affect. Her behavior is normal.    CBC    Component Value Date/Time   WBC 11.3* 10/14/2012 0205   RBC 3.14* 10/14/2012 0205   HGB 8.7* 10/14/2012 0205   HCT 27.1* 10/14/2012 0205   PLT 307 10/14/2012 0205   MCV 86.3 10/14/2012 0205   MCH 27.7 10/14/2012 0205   MCHC 32.1 10/14/2012 0205   RDW 14.7 10/14/2012 0205   LYMPHSABS 1.4 06/01/2012 1055   MONOABS 0.5 06/01/2012 1055   EOSABS 0.1 06/01/2012 1055   BASOSABS 0.0 06/01/2012 1055   CMP     Component Value Date/Time   NA 134* 10/14/2012 0205   K 3.9 10/14/2012 0205   CL 100 10/14/2012 0205   CO2 21 10/14/2012 0205   GLUCOSE 91 10/14/2012 0205   BUN 10 10/14/2012 0205   CREATININE 0.87 10/14/2012 0205   CREATININE 0.62 06/03/2012 1129   CREATININE 0.69 06/01/2012 1055   CALCIUM 8.9 10/14/2012 0205   PROT 7.0 10/14/2012 0205  ALBUMIN 2.8* 10/14/2012 0205   AST 12 10/14/2012 0205   ALT 11 10/14/2012 0205   ALKPHOS 123* 10/14/2012 0205   BILITOT 0.3 10/14/2012 0205   GFRNONAA 83* 10/14/2012 0205   GFRAA >90 10/14/2012 0205   Pro/cr ratio: 0.24  LIMITED OBSTETRIC ULTRASOUND  Number of Fetuses: 1  Heart Rate: 160 bpm  Movement: Yes  Presentation: Cephalic  Placental Location: Anterior  Previa: No  Amniotic Fluid (Subjective): Normal  AFI 14.3 cm (5%ile = 9.0 cm; 95%ile = 23.4 cm)  BPD: 6.7 cm 27 w 1 d EDC: 01/12/2013  MATERNAL FINDINGS:  Cervix: Closed, measuring 3.8 cm in length.  Uterus/Adnexae: Multiple prominent fibroids are noted about the  uterus, the largest on the  left side at the placental margin,  measuring 7.6 cm in size.  There is apparent lifting of the membrane along the placental  margin due to an adjacent 2.6 cm fibroid. Associated nonspecific  myometrial inhomogeneity is noted. This could reflect an  underlying fibroid.  IMPRESSION:  1. Single live intrauterine pregnancy noted, with a biparietal  diameter of 6.7 cm, corresponding to a gestational age of [redacted] weeks  1 day. The gestational age of [redacted] weeks 3 days by LMP reflects an  estimated date of delivery of December 20, 2012.  2. Cervix closed, measuring 3.8 cm in length. Normal amount of  amniotic fluid seen.  3. Prominent fibroids noted; one of these abuts the placental  margin, without evidence of placenta previa.    Prenatal labs: ABO, Rh: --/--/A POS (02/12 0205) Antibody: NEG (02/12 0205) Rubella: 102.4 (09/30 1055) RPR: NON REAC (01/13 0930)  HBsAg: NEGATIVE, NEGATIVE (09/30 1055)  HIV: NON REACTIVE (01/13 0930)  GBS:     Assessment/Plan: IUP at 30.3wks Hemorrhoids- bldg during BM GHTN- elevated BP DM- class B  D/C home Rx Proctocream HC Increase Labetalol to 500mg  BID F/U at clinic on 2/17 as scheduled or sooner prn Will send a message to clinic to send pt home with 24 hr urine collection at next visit   Cam Hai 10/14/2012, 7:32 AM

## 2012-10-19 ENCOUNTER — Encounter: Payer: Medicaid Other | Admitting: Obstetrics and Gynecology

## 2012-10-26 ENCOUNTER — Ambulatory Visit (INDEPENDENT_AMBULATORY_CARE_PROVIDER_SITE_OTHER): Payer: Medicaid Other | Admitting: Obstetrics and Gynecology

## 2012-10-26 ENCOUNTER — Inpatient Hospital Stay (HOSPITAL_COMMUNITY): Payer: Medicaid Other

## 2012-10-26 ENCOUNTER — Encounter (HOSPITAL_COMMUNITY): Payer: Self-pay | Admitting: *Deleted

## 2012-10-26 ENCOUNTER — Inpatient Hospital Stay (HOSPITAL_COMMUNITY)
Admission: AD | Admit: 2012-10-26 | Discharge: 2012-11-02 | DRG: 765 | Disposition: A | Discharge status: Home / Self Care | Payer: Medicaid Other | Source: Ambulatory Visit | Attending: Obstetrics & Gynecology | Admitting: Obstetrics & Gynecology

## 2012-10-26 ENCOUNTER — Other Ambulatory Visit: Payer: Self-pay | Admitting: Obstetrics and Gynecology

## 2012-10-26 ENCOUNTER — Encounter: Payer: Self-pay | Admitting: Obstetrics and Gynecology

## 2012-10-26 VITALS — BP 173/114 | Temp 97.5°F | Wt 198.0 lb

## 2012-10-26 DIAGNOSIS — D649 Anemia, unspecified: Secondary | ICD-10-CM | POA: Diagnosis present

## 2012-10-26 DIAGNOSIS — Z98891 History of uterine scar from previous surgery: Secondary | ICD-10-CM

## 2012-10-26 DIAGNOSIS — O09529 Supervision of elderly multigravida, unspecified trimester: Secondary | ICD-10-CM | POA: Diagnosis present

## 2012-10-26 DIAGNOSIS — O09523 Supervision of elderly multigravida, third trimester: Secondary | ICD-10-CM

## 2012-10-26 DIAGNOSIS — IMO0002 Reserved for concepts with insufficient information to code with codable children: Secondary | ICD-10-CM | POA: Diagnosis present

## 2012-10-26 DIAGNOSIS — E119 Type 2 diabetes mellitus without complications: Secondary | ICD-10-CM | POA: Diagnosis present

## 2012-10-26 DIAGNOSIS — O119 Pre-existing hypertension with pre-eclampsia, unspecified trimester: Secondary | ICD-10-CM | POA: Diagnosis present

## 2012-10-26 DIAGNOSIS — O99892 Other specified diseases and conditions complicating childbirth: Secondary | ICD-10-CM | POA: Diagnosis present

## 2012-10-26 DIAGNOSIS — O2432 Unspecified pre-existing diabetes mellitus in childbirth: Secondary | ICD-10-CM | POA: Diagnosis present

## 2012-10-26 DIAGNOSIS — D4959 Neoplasm of unspecified behavior of other genitourinary organ: Secondary | ICD-10-CM | POA: Diagnosis present

## 2012-10-26 DIAGNOSIS — I10 Essential (primary) hypertension: Secondary | ICD-10-CM

## 2012-10-26 DIAGNOSIS — O0990 Supervision of high risk pregnancy, unspecified, unspecified trimester: Secondary | ICD-10-CM

## 2012-10-26 DIAGNOSIS — O34599 Maternal care for other abnormalities of gravid uterus, unspecified trimester: Secondary | ICD-10-CM | POA: Diagnosis present

## 2012-10-26 DIAGNOSIS — O24919 Unspecified diabetes mellitus in pregnancy, unspecified trimester: Secondary | ICD-10-CM | POA: Diagnosis present

## 2012-10-26 DIAGNOSIS — O9903 Anemia complicating the puerperium: Secondary | ICD-10-CM | POA: Diagnosis present

## 2012-10-26 DIAGNOSIS — Z22330 Carrier of Group B streptococcus: Secondary | ICD-10-CM

## 2012-10-26 DIAGNOSIS — D259 Leiomyoma of uterus, unspecified: Secondary | ICD-10-CM | POA: Diagnosis present

## 2012-10-26 DIAGNOSIS — R062 Wheezing: Secondary | ICD-10-CM | POA: Diagnosis not present

## 2012-10-26 DIAGNOSIS — R0602 Shortness of breath: Secondary | ICD-10-CM | POA: Diagnosis not present

## 2012-10-26 LAB — COMPREHENSIVE METABOLIC PANEL
CO2: 22 mEq/L (ref 19–32)
Calcium: 8.6 mg/dL (ref 8.4–10.5)
Creatinine, Ser: 0.97 mg/dL (ref 0.50–1.10)
Glucose, Bld: 79 mg/dL (ref 70–99)
Total Bilirubin: 0.3 mg/dL (ref 0.3–1.2)
Total Protein: 6.4 g/dL (ref 6.0–8.3)

## 2012-10-26 LAB — POCT URINALYSIS DIP (DEVICE)
Glucose, UA: NEGATIVE mg/dL
Nitrite: NEGATIVE
Specific Gravity, Urine: 1.025 (ref 1.005–1.030)
Urobilinogen, UA: 0.2 mg/dL (ref 0.0–1.0)
pH: 6.5 (ref 5.0–8.0)

## 2012-10-26 LAB — CBC
Hemoglobin: 8.6 g/dL — ABNORMAL LOW (ref 12.0–15.0)
MCH: 27.6 pg (ref 26.0–34.0)
MCHC: 32.6 g/dL (ref 30.0–36.0)
MCV: 84.6 fL (ref 78.0–100.0)
Platelets: 354 10*3/uL (ref 150–400)
RBC: 3.12 MIL/uL — ABNORMAL LOW (ref 3.87–5.11)

## 2012-10-26 LAB — GROUP B STREP BY PCR: Group B strep by PCR: NEGATIVE

## 2012-10-26 LAB — PROTEIN / CREATININE RATIO, URINE: Creatinine, Urine: 245.56 mg/dL

## 2012-10-26 LAB — GLUCOSE, CAPILLARY

## 2012-10-26 MED ORDER — ACETAMINOPHEN 325 MG PO TABS: 650.0000 mg | ORAL_TABLET | ORAL | Status: DC | PRN

## 2012-10-26 MED ORDER — PRENATAL MULTIVITAMIN CH
1.0000 | ORAL_TABLET | Freq: Every day | ORAL | Status: DC
  Administered 2012-10-27: 1 via ORAL
  Filled 2012-10-26: qty 1

## 2012-10-26 MED ORDER — LABETALOL HCL 100 MG PO TABS
500.0000 mg | ORAL_TABLET | Freq: Once | ORAL | Status: AC
  Administered 2012-10-26: 500 mg via ORAL
  Filled 2012-10-26: qty 5

## 2012-10-26 MED ORDER — DOCUSATE SODIUM 100 MG PO CAPS
100.0000 mg | ORAL_CAPSULE | Freq: Every day | ORAL | Status: DC
  Administered 2012-10-27: 100 mg via ORAL
  Filled 2012-10-26: qty 1

## 2012-10-26 MED ORDER — LACTATED RINGERS IV SOLN
INTRAVENOUS | Status: DC
  Administered 2012-10-26 – 2012-10-28 (×5): via INTRAVENOUS

## 2012-10-26 MED ORDER — HYDROCORTISONE ACE-PRAMOXINE 1-1 % RE FOAM
1.0000 | Freq: Three times a day (TID) | RECTAL | Status: DC
  Administered 2012-10-26 (×2): 1 via RECTAL
  Filled 2012-10-26: qty 10

## 2012-10-26 MED ORDER — MAGNESIUM SULFATE BOLUS VIA INFUSION
4.0000 g | Freq: Once | INTRAVENOUS | Status: AC
  Administered 2012-10-26: 4 g via INTRAVENOUS
  Filled 2012-10-26: qty 500

## 2012-10-26 MED ORDER — LABETALOL HCL 300 MG PO TABS: 600.0000 mg | ORAL_TABLET | Freq: Three times a day (TID) | ORAL | Status: DC

## 2012-10-26 MED ORDER — ALBUTEROL SULFATE HFA 108 (90 BASE) MCG/ACT IN AERS
2.0000 | INHALATION_SPRAY | RESPIRATORY_TRACT | Status: DC | PRN
  Filled 2012-10-26: qty 6.7

## 2012-10-26 MED ORDER — ZOLPIDEM TARTRATE 5 MG PO TABS: 5.0000 mg | ORAL_TABLET | Freq: Every evening | ORAL | Status: DC | PRN

## 2012-10-26 MED ORDER — BETAMETHASONE SOD PHOS & ACET 6 (3-3) MG/ML IJ SUSP
12.0000 mg | INTRAMUSCULAR | Status: AC
Start: 2012-10-26 — End: 2012-10-27
  Administered 2012-10-26 – 2012-10-27 (×2): 12 mg via INTRAMUSCULAR
  Filled 2012-10-26 (×2): qty 2

## 2012-10-26 MED ORDER — LABETALOL HCL 300 MG PO TABS
500.0000 mg | ORAL_TABLET | Freq: Three times a day (TID) | ORAL | Status: DC
  Administered 2012-10-26: 500 mg via ORAL
  Filled 2012-10-26 (×3): qty 1

## 2012-10-26 MED ORDER — LABETALOL HCL 5 MG/ML IV SOLN
5.0000 mg | Freq: Once | INTRAVENOUS | Status: AC
  Administered 2012-10-26: 5 mg via INTRAVENOUS
  Filled 2012-10-26: qty 4

## 2012-10-26 MED ORDER — INSULIN ASPART 100 UNIT/ML ~~LOC~~ SOLN
12.0000 [IU] | Freq: Three times a day (TID) | SUBCUTANEOUS | Status: DC
Start: 2012-10-26 — End: 2012-10-28
  Administered 2012-10-26 – 2012-10-27 (×5): 12 [IU] via SUBCUTANEOUS

## 2012-10-26 MED ORDER — LABETALOL HCL 5 MG/ML IV SOLN
10.0000 mg | Freq: Once | INTRAVENOUS | Status: AC
  Administered 2012-10-26: 10 mg via INTRAVENOUS
  Filled 2012-10-26: qty 4

## 2012-10-26 MED ORDER — PANTOPRAZOLE SODIUM 40 MG PO TBEC
40.0000 mg | DELAYED_RELEASE_TABLET | Freq: Every day | ORAL | Status: DC
  Administered 2012-10-26 – 2012-10-28 (×3): 40 mg via ORAL
  Filled 2012-10-26 (×3): qty 1

## 2012-10-26 MED ORDER — INSULIN NPH (HUMAN) (ISOPHANE) 100 UNIT/ML ~~LOC~~ SUSP
12.0000 [IU] | SUBCUTANEOUS | Status: DC
  Administered 2012-10-26 – 2012-10-27 (×2): 12 [IU] via SUBCUTANEOUS
  Filled 2012-10-26: qty 10

## 2012-10-26 MED ORDER — CALCIUM CARBONATE ANTACID 500 MG PO CHEW
2.0000 | CHEWABLE_TABLET | ORAL | Status: DC | PRN
  Administered 2012-10-27: 400 mg via ORAL
  Filled 2012-10-26: qty 2

## 2012-10-26 MED ORDER — MAGNESIUM SULFATE 40 G IN LACTATED RINGERS - SIMPLE
2.0000 g/h | INTRAVENOUS | Status: DC
  Administered 2012-10-27: 2 g/h via INTRAVENOUS
  Filled 2012-10-26 (×2): qty 500

## 2012-10-26 MED ORDER — LABETALOL HCL 300 MG PO TABS
600.0000 mg | ORAL_TABLET | Freq: Four times a day (QID) | ORAL | Status: DC
  Administered 2012-10-26 – 2012-10-30 (×12): 600 mg via ORAL
  Filled 2012-10-26 (×16): qty 2

## 2012-10-26 MED ORDER — HYDROCORTISONE ACE-PRAMOXINE 1-1 % RE CREA: TOPICAL_CREAM | Freq: Three times a day (TID) | RECTAL | Status: DC

## 2012-10-26 NOTE — Progress Notes (Signed)
S/O:  Doing well.          BPs remain elevated           Filed Vitals:   10/26/12 1550 10/26/12 1601 10/26/12 1700 10/26/12 1800  BP: 173/97 169/104 175/102 167/116  Pulse: 97 102 98 98  Temp:   98.4 F (36.9 C)   TempSrc:   Oral   Resp: 18 18 18 18   Height:      Weight:       Discussed with Dr Marice Potter. Will give single dose of Labetalol 10mg  IV then change PO dose to 600mg  every 6 hours

## 2012-10-26 NOTE — MAU Note (Signed)
Patient was sent from the [facility] for evaluation of elevated blood pressure. Patient denies any problems at this time.

## 2012-10-26 NOTE — Progress Notes (Signed)
Patient did not bring CBG log book or meter. Patient states BS well controlled for the most part. Patient reports taking labetalol this am. She denies headaches, visual disturbances, RUQ/epigastric pain, nausea or emesis. Will start fetal testing this week. Due to elevated BP today, patient sent to MAU with 24hr urine in hand for lab and BP monitoring.

## 2012-10-26 NOTE — H&P (Signed)
Carla Brooks is a 42 y.o. female G3P2002 @ [redacted]w[redacted]d by early ultrasound presenting for evaluation of HTN. She was seen in OB-GYN clinic this morning and her BP was > 160/100. Therefore, she was sent to the MAU. At the time of presentation to the MAU, she denies headache, changes in vision, abdominal pain, contractions, vaginal bleeding or leakage of fluids.   History OB History   Grav Para Term Preterm Abortions TAB SAB Ect Mult Living   3 2 2       2      Patient Active Problem List  Diagnosis  . Migraines  . Anemia  . GERD (gastroesophageal reflux disease)  . HTN (hypertension)  . Diabetes mellitus  . Arthritis  . Diabetes mellitus, antepartum-class B  . Benign essential hypertension antepartum  . AMA (advanced maternal age) multigravida 35+  . Unspecified high-risk pregnancy  . Pap smear abnormality of cervix with LGSIL  . GBS (group B streptococcus) UTI complicating pregnancy  . Uterine fibroids affecting pregnancy, antepartum  . Vaginal bleeding in pregnancy    Past Medical History  Diagnosis Date  . Diabetes mellitus   . Hypertension   . Bronchitis   . Arthritis   . GERD (gastroesophageal reflux disease)   . Asthma   . Migraines 02/21/2012  . Anemia 02/21/2012  . HTN (hypertension) 02/21/2012  . Diabetes mellitus 02/21/2012  . Abnormal Pap smear   . Fibroid    Past Surgical History  Procedure Laterality Date  . Cholecystectomy  09/27/2001   Family History: family history includes Cancer in her maternal grandfather, paternal grandfather, and paternal uncle; Coronary artery disease in her father; and Diabetes in her maternal grandmother. Social History:  reports that she has never smoked. She has never used smokeless tobacco. She reports that she does not drink alcohol or use illicit drugs.   Prenatal Transfer Tool  Maternal Diabetes: Yes:  Diabetes Type:  Pre-pregnancy, Insulin/Medication controlled Genetic Screening: normal Harmony test after abnormal  Maternal  Ultrasounds/Referrals: Abnormal:  Findings:   Other:large anterior LUS myoma Fetal Ultrasounds or other Referrals:  Fetal echo - normal Maternal Substance Abuse:  No Significant Maternal Medications:  Meds include: Other: labetalol, insulin, metformin Significant Maternal Lab Results:  None Other Comments:  Prenatal course complicated by DM Class B, Hypertension, AMA, GBS UTI, abnormal pap smear, uterine fibroids   ROS    Blood pressure 175/102, pulse 98, temperature 98.4 F (36.9 C), temperature source Oral, resp. rate 18, height 5\' 9"  (1.753 m), weight 89.812 kg (198 lb), last menstrual period 03/21/2012. Exam Physical Exam  Prenatal labs: ABO, Rh: --/--/A POS (02/12 0205) Antibody: NEG (02/12 0205) Rubella: 102.4 (09/30 1055) RPR: NON REAC (01/13 0930)  HBsAg: NEGATIVE, NEGATIVE (09/30 1055)  HIV: NON REACTIVE (01/13 0930)  GBS:   positive GBS UTI during pregnancy  Protein/Creat > 1.4  Assessment/Plan: 42 year old F G3P2002 @ [redacted]w[redacted]d presenting with pre-eclampsia. Admit to Antenatal  1. Pre-eclampsia: Start Mag, increase labetolol; NST BID  2. DM - Cont insulin, hold metformin since renal insufficiency  3. Betamethasone IM x 2  4. Neonatology Consult  Mat Carne 10/26/2012, 5:38 PM

## 2012-10-26 NOTE — Progress Notes (Signed)
Pt missing many of upper teeth

## 2012-10-26 NOTE — Progress Notes (Signed)
9:14am B/P= 160/110

## 2012-10-26 NOTE — Progress Notes (Signed)
Patient ID: Carla Brooks, female   DOB: [birth year], 42 y.o.   MRN: [id number]  Lab results reviewed with Dr Marice Potter  Results for orders placed during the hospital encounter of 10/26/12 (from the past 24 hour(s))  PROTEIN / CREATININE RATIO, URINE     Status: Abnormal   Collection Time    10/26/12  9:40 AM      Result Value Range   Creatinine, Urine 245.56     Total Protein, Urine 352.2     PROTEIN CREATININE RATIO 1.43 (*) 0.00 - 0.15  CBC     Status: Abnormal   Collection Time    10/26/12  9:52 AM      Result Value Range   WBC 9.2  4.0 - 10.5 K/uL   RBC 3.12 (*) 3.87 - 5.11 MIL/uL   Hemoglobin 8.6 (*) 12.0 - 15.0 g/dL   HCT 95.2 (*) 84.1 - 32.4 %   MCV 84.6  78.0 - 100.0 fL   MCH 27.6  26.0 - 34.0 pg   MCHC 32.6  30.0 - 36.0 g/dL   RDW 40.1  02.7 - 25.3 %   Platelets 354  150 - 400 K/uL  COMPREHENSIVE METABOLIC PANEL     Status: Abnormal   Collection Time    10/26/12  9:52 AM      Result Value Range   Sodium 136  135 - 145 mEq/L   Potassium 3.6  3.5 - 5.1 mEq/L   Chloride 103  96 - 112 mEq/L   CO2 22  19 - 32 mEq/L   Glucose, Bld 79  70 - 99 mg/dL   BUN 7  6 - 23 mg/dL   Creatinine, Ser 6.64  0.50 - 1.10 mg/dL   Calcium 8.6  8.4 - 40.3 mg/dL   Total Protein 6.4  6.0 - 8.3 g/dL   Albumin 2.5 (*) 3.5 - 5.2 g/dL   AST 13  0 - 37 U/L   ALT 10  0 - 35 U/L   Alkaline Phosphatase 131 (*) 39 - 117 U/L   Total Bilirubin 0.3  0.3 - 1.2 mg/dL   GFR calc non Af Amer 73 (*) >90 mL/min   GFR calc Af Amer 84 (*) >90 mL/min   Will start Magnesium Sulfate infusion.  Will repeat CBC and CMET in am and Betamethasone tomorrow.

## 2012-10-26 NOTE — Plan of Care (Signed)
Problem: Consults Goal: Birthing Suites Patient Information Press F2 to bring up selections list Outcome: Completed/Met Date Met:  10/26/12  Pt < [redacted] weeks EGA, PIH (Pregnancy induced hypertension) and Diabetic

## 2012-10-27 ENCOUNTER — Ambulatory Visit (HOSPITAL_COMMUNITY): Payer: Medicaid Other

## 2012-10-27 LAB — COMPREHENSIVE METABOLIC PANEL
ALT: 10 U/L (ref 0–35)
AST: 12 U/L (ref 0–37)
Albumin: 2.4 g/dL — ABNORMAL LOW (ref 3.5–5.2)
Chloride: 104 mEq/L (ref 96–112)
Creatinine, Ser: 0.77 mg/dL (ref 0.50–1.10)
Sodium: 135 mEq/L (ref 135–145)
Total Bilirubin: 0.2 mg/dL — ABNORMAL LOW (ref 0.3–1.2)

## 2012-10-27 LAB — MAGNESIUM: Magnesium: 5.9 mg/dL — ABNORMAL HIGH (ref 1.5–2.5)

## 2012-10-27 LAB — CBC
MCV: 84.2 fL (ref 78.0–100.0)
Platelets: 349 10*3/uL (ref 150–400)
RBC: 3.11 MIL/uL — ABNORMAL LOW (ref 3.87–5.11)
RDW: 14.8 % (ref 11.5–15.5)
WBC: 11.9 10*3/uL — ABNORMAL HIGH (ref 4.0–10.5)

## 2012-10-27 LAB — CREATININE CLEARANCE, URINE, 24 HOUR
Creatinine Clearance: 120 mL/min — ABNORMAL HIGH (ref 75–115)
Creatinine, 24H Ur: 1331 mg/d (ref 700–1800)
Creatinine: 0.77 mg/dL (ref 0.50–1.10)
Urine Total Volume-CRCL: 2300 mL

## 2012-10-27 LAB — PROTEIN, URINE, 24 HOUR
Collection Interval-UPROT: 24 hours
Protein, Urine: 31 mg/dL
Urine Total Volume-UPROT: 2300 mL

## 2012-10-27 LAB — GLUCOSE, CAPILLARY: Glucose-Capillary: 74 mg/dL (ref 70–99)

## 2012-10-27 MED ORDER — ONDANSETRON 8 MG/NS 50 ML IVPB
8.0000 mg | Freq: Once | INTRAVENOUS | Status: AC
  Administered 2012-10-27: 8 mg via INTRAVENOUS
  Filled 2012-10-27: qty 8

## 2012-10-27 MED ORDER — DEXTROMETHORPHAN POLISTIREX 30 MG/5ML PO LQCR
30.0000 mg | Freq: Every evening | ORAL | Status: DC | PRN
  Administered 2012-10-27: 30 mg via ORAL
  Filled 2012-10-27: qty 5

## 2012-10-27 NOTE — Progress Notes (Signed)
UR completed 

## 2012-10-27 NOTE — Progress Notes (Signed)
Antenatal Nutrition Assessment:  Currently  32 2/[redacted] weeks gestation, with CHTN, GDM Height  69"  Weight 198 Lbs  pre-pregnancy weight 186 Lbs .Pre-pregnancy  BMI 27.6  IBW 145 Lbs  Total weight gain 12 Lbs. Weight gain goals 15-25 Lbs.   Estimated needs: 21-2200 kcal/day, 78-88 grams protein/day, 2.4 liters fluid/day  Carbohydrate modified gestational diet .Current diet prescription will provide for increased needs.Pt is followed at [facility] and has received diet education there this pregnancy.  CBG's may become elevated s/p betamethasone.   No abnormal nutrition related labs CBG (last 3)   Recent Labs  10/26/12 1911 10/26/12 2159 10/27/12 0824  GLUCAP 77 90 74     Nutrition Dx: Increased nutrient needs r/t pregnancy and fetal growth requirements aeb [redacted] weeks gestation.  No educational needs assessed at this time.  Elisabeth Cara M.Odis Luster LDN Neonatal Nutrition Support Specialist Pager [phone]

## 2012-10-27 NOTE — Progress Notes (Signed)
Patient ID: Carla Brooks, female   DOB: 1971-04-27, 42 y.o.   MRN: [id number]  S. She has a headache that has just started. She is unsure if it will become one of her usual migraines. She has tried Imitrex in the past and says that that doesn't help. She reports good FM and denies VB, CTXs, or ROM. She denies RUQ pain.  O. BPs now 120s-140s. (much improved over the night)     FHR- reactive, no contractions     DTRs- 0 BL     Abd- benign, gravid     PLT- 349k, creatinine down to .77  A/P. 32.[redacted] weeks EGA with GDM, CHTN, and superimposed pre eclampsia. She will get her second dose of BMZ today. Expectant management.

## 2012-10-27 NOTE — Consult Note (Signed)
Neonatology Consult  Note:  At the request of the patients obstetrician Dr. Marice Potter I met with Ms. Bakos who is at 32.[redacted] weeks EGA with GDM, CHTN, and superimposed pre eclampsia. Blood pressure controlled on labetalol and magnesium.  She is s/p her second dose of BMZ today and continues expectant management.  We reviewed initial delivery room management, including CPAP, [place], and low but certainly possible need for intubation for surfactant administration.  We discussed feeding immaturity and need for full po intake with multiple days of good weight gain and no apnea or bradycardia before discharge.  We reviewed increased risk of jaundice, infection, and temperature instability.    Discussed likely length of stay.  Thank you for allowing [place] to participate in her care.  Please call with questions.  John Giovanni, DO  Neonatologist   Face to face time 20 min.

## 2012-10-28 ENCOUNTER — Ambulatory Visit (HOSPITAL_COMMUNITY): Payer: Medicaid Other

## 2012-10-28 LAB — GLUCOSE, CAPILLARY
Glucose-Capillary: 70 mg/dL (ref 70–99)
Glucose-Capillary: 92 mg/dL (ref 70–99)
Glucose-Capillary: 109 mg/dL — ABNORMAL HIGH (ref 70–99)
Glucose-Capillary: 108 mg/dL — ABNORMAL HIGH (ref 70–99)

## 2012-10-28 LAB — COMPREHENSIVE METABOLIC PANEL
ALT: 9 U/L (ref 0–35)
AST: 12 U/L (ref 0–37)
Alkaline Phosphatase: 105 U/L (ref 39–117)
CO2: 18 mEq/L — ABNORMAL LOW (ref 19–32)
Calcium: 7.5 mg/dL — ABNORMAL LOW (ref 8.4–10.5)
Chloride: 102 mEq/L (ref 96–112)
GFR calc Af Amer: >90 mL/min (ref >90)
GFR calc non Af Amer: 80 mL/min — ABNORMAL LOW (ref >90)
Glucose, Bld: 73 mg/dL (ref 70–99)
Potassium: 3.7 mEq/L (ref 3.5–5.1)
Sodium: 133 mEq/L — ABNORMAL LOW (ref 135–145)

## 2012-10-28 LAB — CBC
Hemoglobin: 8.5 g/dL — ABNORMAL LOW (ref 12.0–15.0)
MCH: 27.7 pg (ref 26.0–34.0)
MCHC: 32.4 g/dL (ref 30.0–36.0)
RDW: 15.2 % (ref 11.5–15.5)

## 2012-10-28 LAB — MAGNESIUM: Magnesium: 5.7 mg/dL — ABNORMAL HIGH (ref 1.5–2.5)

## 2012-10-28 MED ORDER — EPHEDRINE 5 MG/ML INJ: 10.0000 mg | INTRAVENOUS | Status: DC | PRN

## 2012-10-28 MED ORDER — LABETALOL HCL 5 MG/ML IV SOLN
40.0000 mg | Freq: Once | INTRAVENOUS | Status: DC
  Filled 2012-10-28 (×2): qty 8

## 2012-10-28 MED ORDER — TERBUTALINE SULFATE 1 MG/ML IJ SOLN: 0.2500 mg | Freq: Once | INTRAMUSCULAR | Status: AC | PRN

## 2012-10-28 MED ORDER — ONDANSETRON HCL 4 MG/2ML IJ SOLN
4.0000 mg | Freq: Four times a day (QID) | INTRAMUSCULAR | Status: DC | PRN
  Administered 2012-10-28: 4 mg via INTRAVENOUS
  Filled 2012-10-28: qty 2

## 2012-10-28 MED ORDER — MAGNESIUM SULFATE 40 G IN LACTATED RINGERS - SIMPLE
2.0000 g/h | INTRAVENOUS | Status: DC
  Filled 2012-10-28: qty 500

## 2012-10-28 MED ORDER — DIPHENHYDRAMINE HCL 50 MG/ML IJ SOLN: 12.5000 mg | INTRAMUSCULAR | Status: DC | PRN

## 2012-10-28 MED ORDER — HYDRALAZINE HCL 20 MG/ML IJ SOLN
10.0000 mg | INTRAMUSCULAR | Status: DC | PRN
  Administered 2012-10-28 – 2012-10-31 (×6): 10 mg via INTRAVENOUS
  Filled 2012-10-28 (×7): qty 1

## 2012-10-28 MED ORDER — INSULIN REGULAR HUMAN 100 UNIT/ML IJ SOLN
INTRAMUSCULAR | Status: DC
  Filled 2012-10-28: qty 1

## 2012-10-28 MED ORDER — INSULIN ASPART 100 UNIT/ML ~~LOC~~ SOLN: 0.0000 [IU] | SUBCUTANEOUS | Status: DC

## 2012-10-28 MED ORDER — OXYTOCIN 40 UNITS IN LACTATED RINGERS INFUSION - SIMPLE MED
62.5000 mL/h | INTRAVENOUS | Status: DC
  Filled 2012-10-28: qty 1000

## 2012-10-28 MED ORDER — MAGNESIUM SULFATE BOLUS VIA INFUSION
4.0000 g | Freq: Once | INTRAVENOUS | Status: AC
  Administered 2012-10-28: 4 g via INTRAVENOUS
  Filled 2012-10-28: qty 500

## 2012-10-28 MED ORDER — LACTATED RINGERS IV SOLN
500.0000 mL | INTRAVENOUS | Status: DC | PRN
  Administered 2012-10-28: 500 mL via INTRAVENOUS

## 2012-10-28 MED ORDER — ACETAMINOPHEN 325 MG PO TABS: 650.0000 mg | ORAL_TABLET | ORAL | Status: DC | PRN

## 2012-10-28 MED ORDER — PHENYLEPHRINE 40 MCG/ML (10ML) SYRINGE FOR IV PUSH (FOR BLOOD PRESSURE SUPPORT): 80.0000 ug | PREFILLED_SYRINGE | INTRAVENOUS | Status: DC | PRN

## 2012-10-28 MED ORDER — FENTANYL 2.5 MCG/ML BUPIVACAINE 1/10 % EPIDURAL INFUSION (WH - ANES): 14.0000 mL/h | INTRAMUSCULAR | Status: DC

## 2012-10-28 MED ORDER — BUTORPHANOL TARTRATE 1 MG/ML IJ SOLN: 1.0000 mg | INTRAMUSCULAR | Status: DC | PRN

## 2012-10-28 MED ORDER — VANCOMYCIN HCL IN DEXTROSE 1-5 GM/200ML-% IV SOLN
1000.0000 mg | Freq: Two times a day (BID) | INTRAVENOUS | Status: DC
  Administered 2012-10-29: 1000 mg via INTRAVENOUS
  Filled 2012-10-28 (×2): qty 200

## 2012-10-28 MED ORDER — OXYTOCIN BOLUS FROM INFUSION: 500.0000 mL | INTRAVENOUS | Status: DC

## 2012-10-28 MED ORDER — IBUPROFEN 600 MG PO TABS: 600.0000 mg | ORAL_TABLET | Freq: Four times a day (QID) | ORAL | Status: DC | PRN

## 2012-10-28 MED ORDER — OXYCODONE-ACETAMINOPHEN 5-325 MG PO TABS: 1.0000 | ORAL_TABLET | ORAL | Status: DC | PRN

## 2012-10-28 MED ORDER — OXYTOCIN 40 UNITS IN LACTATED RINGERS INFUSION - SIMPLE MED: 1.0000 m[IU]/min | INTRAVENOUS | Status: DC

## 2012-10-28 MED ORDER — LACTATED RINGERS IV SOLN
INTRAVENOUS | Status: DC
  Administered 2012-10-28 – 2012-10-29 (×2): via INTRAVENOUS

## 2012-10-28 MED ORDER — ALBUTEROL SULFATE HFA 108 (90 BASE) MCG/ACT IN AERS
2.0000 | INHALATION_SPRAY | Freq: Four times a day (QID) | RESPIRATORY_TRACT | Status: DC | PRN
  Administered 2012-10-28: 2 via RESPIRATORY_TRACT
  Filled 2012-10-28: qty 6.7

## 2012-10-28 MED ORDER — FUROSEMIDE 10 MG/ML IJ SOLN
20.0000 mg | Freq: Once | INTRAMUSCULAR | Status: AC
  Administered 2012-10-28: 20 mg via INTRAVENOUS
  Filled 2012-10-28: qty 2

## 2012-10-28 MED ORDER — MISOPROSTOL 25 MCG QUARTER TABLET
25.0000 ug | ORAL_TABLET | ORAL | Status: DC | PRN
  Administered 2012-10-28: 25 ug via VAGINAL
  Filled 2012-10-28 (×3): qty 0.25

## 2012-10-28 MED ORDER — CITRIC ACID-SODIUM CITRATE 334-500 MG/5ML PO SOLN
30.0000 mL | ORAL | Status: DC | PRN
  Administered 2012-10-29: 30 mL via ORAL
  Filled 2012-10-28: qty 15

## 2012-10-28 MED ORDER — DEXTROMETHORPHAN POLISTIREX 30 MG/5ML PO LQCR
30.0000 mg | Freq: Two times a day (BID) | ORAL | Status: DC | PRN
  Administered 2012-10-28: 30 mg via ORAL
  Filled 2012-10-28: qty 5

## 2012-10-28 MED ORDER — ZOLPIDEM TARTRATE 5 MG PO TABS: 5.0000 mg | ORAL_TABLET | Freq: Every evening | ORAL | Status: DC | PRN

## 2012-10-28 MED ORDER — LACTATED RINGERS IV SOLN: 500.0000 mL | Freq: Once | INTRAVENOUS | Status: DC

## 2012-10-28 NOTE — Progress Notes (Signed)
Pt GBS positive in urine during pregnancy.  PCN allergy, with resistance to clindamycin and erythromycin.  Vancomycin 1g Q12 hours ordered for GBS prophylaxis to start now.

## 2012-10-28 NOTE — Progress Notes (Signed)
Faculty Practice OB/GYN Attending Note  Discussed patient with Dr. Marjo Bicker in MFM.  Given her persistent severe range BP despite being on maximum dosage of Labetalol and magnesium sulfate, and given that she has already the two doses of betamethasone, the recommendation is to move towards delivery.  This recommendation was discussed with the patient, she agrees with plan.  Temp:  [97.8 F (36.6 C)-98.6 F (37 C)] 98.6 F (37 C) (02/26 0816) Pulse Rate:  [77-101] 86 (02/26 0816) Resp:  [18-20] 20 (02/26 0816) BP: (147-170)/(83-107) 153/83 mmHg (02/26 0816) SpO2:  [97 %-98 %] 97 % (02/25 2224) FHR tracing  reactive, no contractions Cervix 0/thick/long/vertex by Dr. Erin Fulling   Will transfer patient to L&D for IOL at [redacted]w[redacted]d for [facility] with superimposed severe preeclampsia. - Continue magnesium sulfate eclampsia prophylaxis - Continue Labetalol for now, Hydralazine as needed - Will start Glucostabiilzer for DM control - Will start induction with cervical ripening - Routine L & D orders placed  Jaynie Collins, MD, FACOG Attending Obstetrician & Gynecologist Faculty Practice, [facility] [place]

## 2012-10-28 NOTE — Progress Notes (Signed)
Pt c/o SOB. States this SOB is different than what she experiences at home daily. Lungs noted to be diminished in bases but exp/inspiratory wheezes in upper lobes. O2 sats 93-97% w/o O2. Pt refusing to wear O2 at times. BP noted to be elevated as well. CNM notified. Will come to assess pt. Will continue to monitor.

## 2012-10-28 NOTE — Progress Notes (Addendum)
Carla Brooks is a 42 y.o. G3P2002 at [redacted]w[redacted]d by ultrasound admitted for induction of labor due to chronic HTN with superimposed severe preeclampsia.  Patient Active Problem List  Diagnosis  . Migraines  . Anemia  . GERD (gastroesophageal reflux disease)  . HTN (hypertension)  . Diabetes mellitus  . Arthritis  . Diabetes mellitus, antepartum-class B  . Chronic hypertension with superimposed preeclampsia  . AMA (advanced maternal age) multigravida 35+  . Unspecified high-risk pregnancy  . Pap smear abnormality of cervix with LGSIL  . GBS (group B streptococcus) UTI complicating pregnancy  . Uterine fibroids affecting pregnancy, antepartum  . Vaginal bleeding in pregnancy     Subjective: No complaints  Objective: BP 157/84  Pulse 98  Temp(Src) 98.5 F (36.9 C) (Oral)  Resp 18  Ht 5\' 9"  (1.753 m)  Wt 198 lb (89.812 kg)  BMI 29.23 kg/m2  SpO2 97%  LMP 03/21/2012 I/O last 3 completed shifts: In: 6570 [P.O.:2020; I.V.:4500; IV Piggyback:50] Out: 3975 [Urine:3650; Emesis/NG output:325] Total I/O In: 809.6 [P.O.:150; I.V.:659.6] Out: 200 [Urine:200]  FHT:  FHR: 150 bpm, variability: moderate,  accelerations:  Abscent,  decelerations:  Present variable UC:   regular, every 5 minutes SVE:   Dilation: Fingertip Effacement (%): 50 Station: -2 Exam by:: Sanjuana Letters, cnm  Labs: Lab Results  Component Value Date   WBC 13.1* 10/28/2012   HGB 8.5* 10/28/2012   HCT 26.2* 10/28/2012   MCV 85.3 10/28/2012   PLT 347 10/28/2012    Assessment / Plan: Induction of labor for chronic HTN with superimposed severe pre-eclampsia   Labor: latent labor, s/p cytotec x 1, Foley bulb placed, membranes still intact Preeclampsia:  on magnesium sulfate and labs stable, cont labetalol  Fetal Wellbeing:  Category II Pain Control:  Epidural DM: Check CBG's and initiate insulin drip when CBG > 120 Anticipated MOD:  NSVD  WILLIAMSON, EDWARD 10/28/2012, 4:35 PM  I have seen this patient  and agree with the above resident's note.  LEFTWICH-KIRBY, Carla Brooks Certified Nurse-Midwife

## 2012-10-28 NOTE — Progress Notes (Signed)
Carla Brooks is a 42 y.o. G3P2002 at [redacted]w[redacted]d  admitted for induction of labor due to Pre-eclamptic toxemia of pregnancy..  Subjective: Breathing is better  Objective: BP 169/89  Pulse 95  Temp(Src) 98.6 F (37 C) (Oral)  Resp 18  Ht 5\' 9"  (1.753 m)  Wt 89.812 kg (198 lb)  BMI 29.23 kg/m2  SpO2 100%  LMP 03/21/2012 Total I/O In: 275 [I.V.:275] Out: 1030 [Urine:1030] Received 1 dose of IV hydralazine for severe range B/P Still wearing O2 via facemask  FHT:  FHR: 130 bpm, variability: minimal ,  accelerations:  Abscent,  decelerations:  Present occ late/occ variable UC:   irregular, every 2-6 minutes SVE:   Foley still in Results for orders placed during the hospital encounter of 10/26/12 (from the past 24 hour(s))  GLUCOSE, CAPILLARY     Status: Abnormal   Collection Time    10/27/12 10:32 PM      Result Value Range   Glucose-Capillary 59 (*) 70 - 99 mg/dL  MAGNESIUM     Status: Abnormal   Collection Time    10/27/12 10:47 PM      Result Value Range   Magnesium 5.9 (*) 1.5 - 2.5 mg/dL  OB RESULTS CONSOLE GBS     Status: None   Collection Time    10/28/12 12:00 AM      Result Value Range   GBS Positive    GLUCOSE, CAPILLARY     Status: None   Collection Time    10/28/12 12:26 AM      Result Value Range   Glucose-Capillary 77  70 - 99 mg/dL  GLUCOSE, CAPILLARY     Status: None   Collection Time    10/28/12  6:43 AM      Result Value Range   Glucose-Capillary 70  70 - 99 mg/dL  COMPREHENSIVE METABOLIC PANEL     Status: Abnormal   Collection Time    10/28/12  7:55 AM      Result Value Range   Sodium 133 (*) 135 - 145 mEq/L   Potassium 3.7  3.5 - 5.1 mEq/L   Chloride 102  96 - 112 mEq/L   CO2 18 (*) 19 - 32 mEq/L   Glucose, Bld 73  70 - 99 mg/dL   BUN 9  6 - 23 mg/dL   Creatinine, Ser 1.91  0.50 - 1.10 mg/dL   Calcium 7.5 (*) 8.4 - 10.5 mg/dL   Total Protein 6.2  6.0 - 8.3 g/dL   Albumin 2.4 (*) 3.5 - 5.2 g/dL   AST 12  0 - 37 U/L   ALT 9  0 - 35 U/L   Alkaline Phosphatase 105  39 - 117 U/L   Total Bilirubin 0.2 (*) 0.3 - 1.2 mg/dL   GFR calc non Af Amer 80 (*) >90 mL/min   GFR calc Af Amer >90  >90 mL/min  CBC     Status: Abnormal   Collection Time    10/28/12  7:55 AM      Result Value Range   WBC 13.1 (*) 4.0 - 10.5 K/uL   RBC 3.07 (*) 3.87 - 5.11 MIL/uL   Hemoglobin 8.5 (*) 12.0 - 15.0 g/dL   HCT 47.8 (*) 29.5 - 62.1 %   MCV 85.3  78.0 - 100.0 fL   MCH 27.7  26.0 - 34.0 pg   MCHC 32.4  30.0 - 36.0 g/dL   RDW 30.8  65.7 - 84.6 %   Platelets 347  150 - 400 K/uL  GLUCOSE, CAPILLARY     Status: None   Collection Time    10/28/12 11:44 AM      Result Value Range   Glucose-Capillary 92  70 - 99 mg/dL  GLUCOSE, CAPILLARY     Status: Abnormal   Collection Time    10/28/12  4:06 PM      Result Value Range   Glucose-Capillary 109 (*) 70 - 99 mg/dL  MAGNESIUM     Status: Abnormal   Collection Time    10/28/12  7:55 PM      Result Value Range   Magnesium 5.7 (*) 1.5 - 2.5 mg/dL  GLUCOSE, CAPILLARY     Status: Abnormal   Collection Time    10/28/12  8:08 PM      Result Value Range   Glucose-Capillary 108 (*) 70 - 99 mg/dL    Labs: Lab Results  Component Value Date   WBC 13.1* 10/28/2012   HGB 8.5* 10/28/2012   HCT 26.2* 10/28/2012   MCV 85.3 10/28/2012   PLT 347 10/28/2012    Assessment / Plan: IOL for preeclampsia, severe, ripeining phase Dr. Despina Hidden reviewed strip Labor: ripening phase Fetal Wellbeing:  Category II Pain Control:  Labor support without medications Anticipated MOD:  NSVD  CRESENZO-DISHMAN,Lulabelle Desta 10/28/2012, 10:17 PM

## 2012-10-28 NOTE — Progress Notes (Signed)
   Carla Brooks is a 42 y.o. G3P2002 at [redacted]w[redacted]d  admitted for induction of labor due to Pre-eclamptic toxemia of pregnancy..  Subjective: C/O SOB; Denies HA, visual changes, RUQ pain  Objective: 190/109; 95 pulse; resp 22; 02 sat 92-95% Urine output = 300cc in past 7.5 hours  Lungs:  Exp/ins bilateral wheezing FHT:  FHR: 150 bpm, variability: minimal ,  accelerations:  Abscent,  decelerations:  Present occasional late, occasional variable UC:   irregular, every 2-5 minutes SVE:   Foley still in MgSO4 at 2gm/hr   Labs: Lab Results  Component Value Date   WBC 13.1* 10/28/2012   HGB 8.5* 10/28/2012   HCT 26.2* 10/28/2012   MCV 85.3 10/28/2012   PLT 347 10/28/2012    Assessment / Plan: IOL for severe preeclampsia; SOB with wheezes  Hydralazine for B/P, goal ~ 150/90-100 Lasix 20mg  X1 STAT MgSO4 level Foley catheter Continue 02 via facemask Dr. Despina Hidden notified  Labor: ripening phase Fetal Wellbeing:  Category II Pain Control:  Labor support without medications Anticipated MOD:  NSVD  CRESENZO-DISHMAN,Ellamay Fors 10/28/2012, 7:43 PM

## 2012-10-28 NOTE — Progress Notes (Signed)
1735 leftwich-kirby, cnm reviewed tracing in rounds and called to instruct to place oxygen if not done as well as fluid bolus and position changes if not done.  Will come see pt after rounds unless needed before.

## 2012-10-28 NOTE — Progress Notes (Signed)
Patient ID: Silena Wyss, female   DOB: 15-Nov-1970, 42 y.o.   MRN: [id number] [facility] ANTEPARTUM COMPREHENSIVE PROGRESS NOTE  Katheen Aslin is a 42 y.o. G3P2002 at [redacted]w[redacted]d  who is admitted for  Elevated blood pressure.   Fetal presentation is cephalic. Length of Stay:  2  Days  Subjective: Patient denies HA, visual changes or RUQ pain Patient reports good fetal movement.  She reports no uterine contractions, no bleeding and no loss of fluid per vagina.  Vitals:  Blood pressure 150/88, pulse 87, temperature 97.8 F (36.6 C), temperature source Oral, resp. rate 20, height 5\' 9"  (1.753 m), weight 198 lb (89.812 kg), last menstrual period 03/21/2012, SpO2 97.00%. Physical Examination: General appearance - alert, well appearing, and in no distress Mental status - alert, oriented to person, place, and time, normal mood, behavior, speech, dress, motor activity, and thought processes Chest - clear to auscultation, no wheezes, rales or rhonchi, symmetric air entry Heart - normal rate, regular rhythm, normal S1, S2, no murmurs, rubs, clicks or gallops Abdomen - soft, nontender, nondistended, no masses or organomegaly Gravid, no RUQ pain Extremities - peripheral pulses normal, no pedal edema, no clubbing or cyanosis, no pedal edema noted, absent reflexes Cervical Exam: Evaluated by digital exam., Position: mid position, Dilation: 0cm, Thickness: 2 cm and Consistency: firm and found to be 3 cm/ Long/-3 and fetal presentation is cephalic. Membranes:intact  Fetal Monitoring:  Baseline: 130's bpm and Variability: Fair (1-6 bpm)  Labs:  Results for orders placed during the hospital encounter of 10/26/12 (from the past 24 hour(s))  GLUCOSE, CAPILLARY   Collection Time    10/27/12  8:24 AM      Result Value Range   Glucose-Capillary 74  70 - 99 mg/dL  GLUCOSE, CAPILLARY   Collection Time    10/27/12 12:26 PM      Result Value Range   Glucose-Capillary 72  70 - 99 mg/dL  GLUCOSE,  CAPILLARY   Collection Time    10/27/12  7:00 PM      Result Value Range   Glucose-Capillary 65 (*) 70 - 99 mg/dL  GLUCOSE, CAPILLARY   Collection Time    10/27/12 10:32 PM      Result Value Range   Glucose-Capillary 59 (*) 70 - 99 mg/dL  MAGNESIUM   Collection Time    10/27/12 10:47 PM      Result Value Range   Magnesium 5.9 (*) 1.5 - 2.5 mg/dL  GLUCOSE, CAPILLARY   Collection Time    10/28/12 12:26 AM      Result Value Range   Glucose-Capillary 77  70 - 99 mg/dL  GLUCOSE, CAPILLARY   Collection Time    10/28/12  6:43 AM      Result Value Range   Glucose-Capillary 70  70 - 99 mg/dL    Imaging Studies:    Reviewed sono from admission   Medications:  Scheduled . docusate sodium  100 mg Oral Daily  . hydrocortisone-pramoxine  1 applicator Rectal TID  . insulin aspart  12 Units Subcutaneous TID AC  . insulin NPH  12 Units Subcutaneous BH-qamhs  . labetalol  600 mg Oral Q6H  . pantoprazole  40 mg Oral Daily  . prenatal multivitamin  1 tablet Oral Daily   I have reviewed the patient's current medications.  ASSESSMENT: Patient Active Problem List  Diagnosis  . Migraines  . Anemia  . GERD (gastroesophageal reflux disease)  . HTN (hypertension)  . Diabetes mellitus  . Arthritis  .  Diabetes mellitus, antepartum-class B  . Benign essential hypertension antepartum  . AMA (advanced maternal age) multigravida 35+  . Unspecified high-risk pregnancy  . Pap smear abnormality of cervix with LGSIL  . GBS (group B streptococcus) UTI complicating pregnancy  . Uterine fibroids affecting pregnancy, antepartum  . Vaginal bleeding in pregnancy  s/p BMZ 2 doses S/p >24hours of Magnesium sulfate Superimposed preeclampsia  PLAN: Discontinue Magnesium sulfate Consider MFM consult to discuss timing of delivery Continue routine antenatal care. Repeat CBC/CMP today   HARRAWAY-SMITH, Sarahmarie Leavey 10/28/2012,8:08 AM

## 2012-10-28 NOTE — Progress Notes (Signed)
Pt. Transferred to Labor and Delivery. Report given to Mardella Layman, Charity fundraiser. Pt. Stable and all belongings are with her. Family at bedside.

## 2012-10-29 ENCOUNTER — Encounter (HOSPITAL_COMMUNITY): Payer: Self-pay | Admitting: Anesthesiology

## 2012-10-29 ENCOUNTER — Inpatient Hospital Stay (HOSPITAL_COMMUNITY): Payer: Medicaid Other | Admitting: Anesthesiology

## 2012-10-29 ENCOUNTER — Encounter (HOSPITAL_COMMUNITY)
Admission: AD | Disposition: A | Discharge status: Home / Self Care | Payer: Self-pay | Source: Ambulatory Visit | Attending: Obstetrics & Gynecology

## 2012-10-29 DIAGNOSIS — O2432 Unspecified pre-existing diabetes mellitus in childbirth: Secondary | ICD-10-CM

## 2012-10-29 DIAGNOSIS — IMO0002 Reserved for concepts with insufficient information to code with codable children: Secondary | ICD-10-CM

## 2012-10-29 LAB — COMPREHENSIVE METABOLIC PANEL
AST: 13 U/L (ref 0–37)
BUN: 14 mg/dL (ref 6–23)
CO2: 23 mEq/L (ref 19–32)
Calcium: 7.4 mg/dL — ABNORMAL LOW (ref 8.4–10.5)
Chloride: 101 mEq/L (ref 96–112)
Creatinine, Ser: 1 mg/dL (ref 0.50–1.10)
GFR calc Af Amer: 81 mL/min — ABNORMAL LOW (ref >90)
GFR calc non Af Amer: 70 mL/min — ABNORMAL LOW (ref >90)
Total Bilirubin: 0.3 mg/dL (ref 0.3–1.2)

## 2012-10-29 LAB — CBC
HCT: 24.7 % — ABNORMAL LOW (ref 36.0–46.0)
MCH: 27.7 pg (ref 26.0–34.0)
MCV: 85.5 fL (ref 78.0–100.0)
Platelets: 312 10*3/uL (ref 150–400)
RBC: 2.89 MIL/uL — ABNORMAL LOW (ref 3.87–5.11)

## 2012-10-29 LAB — GLUCOSE, CAPILLARY
Glucose-Capillary: 138 mg/dL — ABNORMAL HIGH (ref 70–99)
Glucose-Capillary: 135 mg/dL — ABNORMAL HIGH (ref 70–99)

## 2012-10-29 LAB — MAGNESIUM: Magnesium: 6.7 mg/dL (ref 1.5–2.5)

## 2012-10-29 SURGERY — Surgical Case
Anesthesia: Spinal | Site: Abdomen | Wound class: Clean Contaminated

## 2012-10-29 MED ORDER — DIPHENHYDRAMINE HCL 25 MG PO CAPS: 25.0000 mg | ORAL_CAPSULE | ORAL | Status: DC | PRN

## 2012-10-29 MED ORDER — SENNOSIDES-DOCUSATE SODIUM 8.6-50 MG PO TABS
2.0000 | ORAL_TABLET | Freq: Every day | ORAL | Status: DC
  Administered 2012-10-29 – 2012-11-01 (×4): 2 via ORAL

## 2012-10-29 MED ORDER — DIPHENHYDRAMINE HCL 50 MG/ML IJ SOLN: 25.0000 mg | INTRAMUSCULAR | Status: DC | PRN

## 2012-10-29 MED ORDER — DIPHENHYDRAMINE HCL 25 MG PO CAPS: 25.0000 mg | ORAL_CAPSULE | Freq: Four times a day (QID) | ORAL | Status: DC | PRN

## 2012-10-29 MED ORDER — KETOROLAC TROMETHAMINE 30 MG/ML IJ SOLN: 30.0000 mg | Freq: Four times a day (QID) | INTRAMUSCULAR | Status: AC | PRN

## 2012-10-29 MED ORDER — EPHEDRINE 5 MG/ML INJ
INTRAVENOUS | Status: AC
  Filled 2012-10-29: qty 10

## 2012-10-29 MED ORDER — ONDANSETRON HCL 4 MG/2ML IJ SOLN
INTRAMUSCULAR | Status: DC | PRN
  Administered 2012-10-29: 4 mg via INTRAVENOUS

## 2012-10-29 MED ORDER — METOCLOPRAMIDE HCL 5 MG/ML IJ SOLN: 10.0000 mg | Freq: Three times a day (TID) | INTRAMUSCULAR | Status: DC | PRN

## 2012-10-29 MED ORDER — MORPHINE SULFATE (PF) 0.5 MG/ML IJ SOLN
INTRAMUSCULAR | Status: DC | PRN
  Administered 2012-10-29: .15 mg via INTRATHECAL

## 2012-10-29 MED ORDER — GENTAMICIN SULFATE 40 MG/ML IJ SOLN
Freq: Once | INTRAMUSCULAR | Status: AC
  Administered 2012-10-29: 378 mL via INTRAVENOUS
  Filled 2012-10-29: qty 9.45

## 2012-10-29 MED ORDER — NALBUPHINE HCL 10 MG/ML IJ SOLN
5.0000 mg | INTRAMUSCULAR | Status: DC | PRN
  Filled 2012-10-29: qty 1

## 2012-10-29 MED ORDER — ZOLPIDEM TARTRATE 5 MG PO TABS: 5.0000 mg | ORAL_TABLET | Freq: Every evening | ORAL | Status: DC | PRN

## 2012-10-29 MED ORDER — CLINDAMYCIN PHOSPHATE 900 MG/50ML IV SOLN: 900.0000 mg | Freq: Once | INTRAVENOUS | Status: DC

## 2012-10-29 MED ORDER — MAGNESIUM SULFATE 40 G IN LACTATED RINGERS - SIMPLE
2.0000 g/h | INTRAVENOUS | Status: DC
Start: 2012-10-29 — End: 2012-10-29
  Administered 2012-10-29: 2 g/h via INTRAVENOUS
  Filled 2012-10-29 (×2): qty 500

## 2012-10-29 MED ORDER — LIDOCAINE HCL (PF) 1 % IJ SOLN
INTRAMUSCULAR | Status: AC
  Filled 2012-10-29: qty 30

## 2012-10-29 MED ORDER — IBUPROFEN 600 MG PO TABS
600.0000 mg | ORAL_TABLET | Freq: Four times a day (QID) | ORAL | Status: DC
  Administered 2012-10-29 – 2012-11-02 (×15): 600 mg via ORAL
  Filled 2012-10-29 (×17): qty 1

## 2012-10-29 MED ORDER — OXYTOCIN 40 UNITS IN LACTATED RINGERS INFUSION - SIMPLE MED: 1.0000 m[IU]/min | INTRAVENOUS | Status: DC

## 2012-10-29 MED ORDER — FENTANYL CITRATE 0.05 MG/ML IJ SOLN
INTRAMUSCULAR | Status: AC
  Filled 2012-10-29: qty 2

## 2012-10-29 MED ORDER — OXYTOCIN 40 UNITS IN LACTATED RINGERS INFUSION - SIMPLE MED: 62.5000 mL/h | INTRAVENOUS | Status: AC

## 2012-10-29 MED ORDER — TETANUS-DIPHTH-ACELL PERTUSSIS 5-2.5-18.5 LF-MCG/0.5 IM SUSP
0.5000 mL | Freq: Once | INTRAMUSCULAR | Status: AC
  Administered 2012-10-30: 0.5 mL via INTRAMUSCULAR
  Filled 2012-10-29: qty 0.5

## 2012-10-29 MED ORDER — LACTATED RINGERS IV SOLN
INTRAVENOUS | Status: DC
  Administered 2012-10-29: 01:00:00 via INTRAUTERINE

## 2012-10-29 MED ORDER — SIMETHICONE 80 MG PO CHEW
80.0000 mg | CHEWABLE_TABLET | Freq: Three times a day (TID) | ORAL | Status: DC
  Administered 2012-10-29 – 2012-11-02 (×14): 80 mg via ORAL

## 2012-10-29 MED ORDER — FENTANYL CITRATE 0.05 MG/ML IJ SOLN
INTRAMUSCULAR | Status: DC | PRN
  Administered 2012-10-29: 25 ug via INTRATHECAL

## 2012-10-29 MED ORDER — EPHEDRINE SULFATE 50 MG/ML IJ SOLN
INTRAMUSCULAR | Status: DC | PRN
  Administered 2012-10-29: 15 mg via INTRAVENOUS
  Administered 2012-10-29: 10 mg via INTRAVENOUS
  Administered 2012-10-29: 15 mg via INTRAVENOUS
  Administered 2012-10-29: 10 mg via INTRAVENOUS

## 2012-10-29 MED ORDER — DIBUCAINE 1 % RE OINT: 1.0000 "application " | TOPICAL_OINTMENT | RECTAL | Status: DC | PRN

## 2012-10-29 MED ORDER — INFLUENZA VIRUS VACC SPLIT PF IM SUSP
0.5000 mL | INTRAMUSCULAR | Status: AC
  Filled 2012-10-29: qty 0.5

## 2012-10-29 MED ORDER — MAGNESIUM SULFATE 40 G IN LACTATED RINGERS - SIMPLE
1.0000 g/h | INTRAVENOUS | Status: DC
  Administered 2012-10-29: 1 g/h via INTRAVENOUS

## 2012-10-29 MED ORDER — BUPIVACAINE LIPOSOME 1.3 % IJ SUSP
INTRAMUSCULAR | Status: DC | PRN
  Administered 2012-10-29: 20 mL

## 2012-10-29 MED ORDER — PRENATAL MULTIVITAMIN CH
1.0000 | ORAL_TABLET | Freq: Every day | ORAL | Status: DC
  Administered 2012-11-02: 1 via ORAL
  Filled 2012-10-29 (×2): qty 1

## 2012-10-29 MED ORDER — SODIUM CHLORIDE 0.9 % IJ SOLN: 3.0000 mL | INTRAMUSCULAR | Status: DC | PRN

## 2012-10-29 MED ORDER — WITCH HAZEL-GLYCERIN EX PADS: 1.0000 "application " | MEDICATED_PAD | CUTANEOUS | Status: DC | PRN

## 2012-10-29 MED ORDER — OXYTOCIN 10 UNIT/ML IJ SOLN
INTRAMUSCULAR | Status: AC
  Filled 2012-10-29: qty 4

## 2012-10-29 MED ORDER — HYDROMORPHONE HCL PF 1 MG/ML IJ SOLN: 0.2500 mg | INTRAMUSCULAR | Status: DC | PRN

## 2012-10-29 MED ORDER — KETOROLAC TROMETHAMINE 60 MG/2ML IM SOLN
60.0000 mg | Freq: Once | INTRAMUSCULAR | Status: AC | PRN
  Administered 2012-10-29: 60 mg via INTRAMUSCULAR

## 2012-10-29 MED ORDER — MORPHINE SULFATE 0.5 MG/ML IJ SOLN
INTRAMUSCULAR | Status: AC
  Filled 2012-10-29: qty 10

## 2012-10-29 MED ORDER — DIPHENHYDRAMINE HCL 50 MG/ML IJ SOLN: 12.5000 mg | INTRAMUSCULAR | Status: DC | PRN

## 2012-10-29 MED ORDER — SCOPOLAMINE 1 MG/3DAYS TD PT72
1.0000 | MEDICATED_PATCH | Freq: Once | TRANSDERMAL | Status: AC
  Administered 2012-10-29: 1.5 mg via TRANSDERMAL

## 2012-10-29 MED ORDER — OXYCODONE-ACETAMINOPHEN 5-325 MG PO TABS
1.0000 | ORAL_TABLET | ORAL | Status: DC | PRN
  Administered 2012-10-30: 2 via ORAL
  Administered 2012-11-01 – 2012-11-02 (×3): 1 via ORAL
  Filled 2012-10-29 (×2): qty 1
  Filled 2012-10-29: qty 2
  Filled 2012-10-29: qty 1

## 2012-10-29 MED ORDER — PHENYLEPHRINE HCL 10 MG/ML IJ SOLN
INTRAMUSCULAR | Status: DC | PRN
  Administered 2012-10-29 (×2): 80 ug via INTRAVENOUS
  Administered 2012-10-29 (×6): 40 ug via INTRAVENOUS

## 2012-10-29 MED ORDER — NALOXONE HCL 0.4 MG/ML IJ SOLN: 0.4000 mg | INTRAMUSCULAR | Status: DC | PRN

## 2012-10-29 MED ORDER — LACTATED RINGERS IV SOLN
INTRAVENOUS | Status: DC | PRN
  Administered 2012-10-29: 03:00:00 via INTRAVENOUS

## 2012-10-29 MED ORDER — PNEUMOCOCCAL VAC POLYVALENT 25 MCG/0.5ML IJ INJ
0.5000 mL | INJECTION | INTRAMUSCULAR | Status: AC
  Administered 2012-10-30: 0.5 mL via INTRAMUSCULAR
  Filled 2012-10-29: qty 0.5

## 2012-10-29 MED ORDER — BUPIVACAINE LIPOSOME 1.3 % IJ SUSP
20.0000 mL | Freq: Once | INTRAMUSCULAR | Status: DC
  Filled 2012-10-29: qty 20

## 2012-10-29 MED ORDER — NALOXONE HCL 1 MG/ML IJ SOLN: 1.0000 ug/kg/h | INTRAVENOUS | Status: DC | PRN

## 2012-10-29 MED ORDER — ONDANSETRON HCL 4 MG/2ML IJ SOLN
INTRAMUSCULAR | Status: AC
  Filled 2012-10-29: qty 2

## 2012-10-29 MED ORDER — SIMETHICONE 80 MG PO CHEW
80.0000 mg | CHEWABLE_TABLET | ORAL | Status: DC | PRN
  Administered 2012-11-01: 80 mg via ORAL

## 2012-10-29 MED ORDER — ONDANSETRON 8 MG/NS 50 ML IVPB
8.0000 mg | Freq: Three times a day (TID) | INTRAVENOUS | Status: DC
  Administered 2012-10-29 – 2012-10-30 (×4): 8 mg via INTRAVENOUS
  Filled 2012-10-29 (×5): qty 8

## 2012-10-29 MED ORDER — SODIUM CHLORIDE 0.9 % IV SOLN
2.0000 g | INTRAVENOUS | Status: DC | PRN
  Administered 2012-10-29: 2 g via INTRAVENOUS

## 2012-10-29 MED ORDER — LANOLIN HYDROUS EX OINT: 1.0000 "application " | TOPICAL_OINTMENT | CUTANEOUS | Status: DC | PRN

## 2012-10-29 MED ORDER — HYDRALAZINE HCL 20 MG/ML IJ SOLN
INTRAMUSCULAR | Status: AC
  Filled 2012-10-29: qty 1

## 2012-10-29 MED ORDER — FUROSEMIDE 10 MG/ML IJ SOLN
20.0000 mg | Freq: Once | INTRAMUSCULAR | Status: AC
  Administered 2012-10-29: 20 mg via INTRAVENOUS
  Filled 2012-10-29: qty 2

## 2012-10-29 MED ORDER — MENTHOL 3 MG MT LOZG: 1.0000 | LOZENGE | OROMUCOSAL | Status: DC | PRN

## 2012-10-29 MED ORDER — SODIUM CHLORIDE 0.9 % IJ SOLN
INTRAMUSCULAR | Status: DC | PRN
  Administered 2012-10-29: 50 mL via INTRAVENOUS

## 2012-10-29 MED ORDER — PHENYLEPHRINE 40 MCG/ML (10ML) SYRINGE FOR IV PUSH (FOR BLOOD PRESSURE SUPPORT)
PREFILLED_SYRINGE | INTRAVENOUS | Status: AC
  Filled 2012-10-29: qty 5

## 2012-10-29 MED ORDER — MEPERIDINE HCL 25 MG/ML IJ SOLN: 6.2500 mg | INTRAMUSCULAR | Status: DC | PRN

## 2012-10-29 MED ORDER — ONDANSETRON HCL 4 MG/2ML IJ SOLN: 4.0000 mg | Freq: Three times a day (TID) | INTRAMUSCULAR | Status: DC | PRN

## 2012-10-29 MED ORDER — GENTAMICIN IN SALINE 1-0.9 MG/ML-% IV SOLN: 100.0000 mg | Freq: Once | INTRAVENOUS | Status: DC

## 2012-10-29 MED ORDER — LACTATED RINGERS IV SOLN
INTRAVENOUS | Status: DC
  Administered 2012-10-29 – 2012-10-30 (×2): via INTRAVENOUS

## 2012-10-29 MED ORDER — IBUPROFEN 600 MG PO TABS: 600.0000 mg | ORAL_TABLET | Freq: Four times a day (QID) | ORAL | Status: DC | PRN

## 2012-10-29 MED ORDER — LACTATED RINGERS IV SOLN
40.0000 [IU] | INTRAVENOUS | Status: DC | PRN
  Administered 2012-10-29: 80 [IU] via INTRAVENOUS

## 2012-10-29 SURGICAL SUPPLY — 41 items
ADH SKN CLS APL DERMABOND .7 (GAUZE/BANDAGES/DRESSINGS) ×2
ADH SKN CLS LQ APL DERMABOND (GAUZE/BANDAGES/DRESSINGS) ×1
CLOTH BEACON ORANGE TIMEOUT ST (SAFETY) ×2 IMPLANT
DERMABOND ADHESIVE PROPEN (GAUZE/BANDAGES/DRESSINGS) ×1
DERMABOND ADVANCED (GAUZE/BANDAGES/DRESSINGS) ×2
DERMABOND ADVANCED .7 DNX12 (GAUZE/BANDAGES/DRESSINGS) ×2 IMPLANT
DERMABOND ADVANCED .7 DNX6 (GAUZE/BANDAGES/DRESSINGS) IMPLANT
DRAPE LG THREE QUARTER DISP (DRAPES) ×2 IMPLANT
DRSG OPSITE POSTOP 4X10 (GAUZE/BANDAGES/DRESSINGS) ×2 IMPLANT
DURAPREP 26ML APPLICATOR (WOUND CARE) ×6 IMPLANT
ELECT REM PT RETURN 9FT ADLT (ELECTROSURGICAL) ×2
ELECTRODE REM PT RTRN 9FT ADLT (ELECTROSURGICAL) ×1 IMPLANT
EXTRACTOR VACUUM BELL STYLE (SUCTIONS) IMPLANT
GLOVE BIOGEL PI IND STRL 8 (GLOVE) ×1 IMPLANT
GLOVE BIOGEL PI INDICATOR 8 (GLOVE) ×1
GLOVE ECLIPSE 8.0 STRL XLNG CF (GLOVE) ×2 IMPLANT
KIT ABG SYR 3ML LUER SLIP (SYRINGE) ×2 IMPLANT
NDL HYPO 18GX1.5 BLUNT FILL (NEEDLE) ×1 IMPLANT
NDL HYPO 25X5/8 SAFETYGLIDE (NEEDLE) ×1 IMPLANT
NEEDLE HYPO 18GX1.5 BLUNT FILL (NEEDLE) ×2 IMPLANT
NEEDLE HYPO 22GX1.5 SAFETY (NEEDLE) ×2 IMPLANT
NEEDLE HYPO 25X5/8 SAFETYGLIDE (NEEDLE) ×2 IMPLANT
NS IRRIG 1000ML POUR BTL (IV SOLUTION) ×2 IMPLANT
PACK C SECTION WH (CUSTOM PROCEDURE TRAY) ×2 IMPLANT
PAD OB MATERNITY 4.3X12.25 (PERSONAL CARE ITEMS) ×2 IMPLANT
RTRCTR C-SECT PINK 25CM LRG (MISCELLANEOUS) IMPLANT
SLEEVE SCD COMPRESS KNEE MED (MISCELLANEOUS) ×1 IMPLANT
STAPLER VISISTAT 35W (STAPLE) IMPLANT
SUT CHROMIC 0 CT 1 (SUTURE) ×2 IMPLANT
SUT MNCRL 0 VIOLET CTX 36 (SUTURE) ×2 IMPLANT
SUT MONOCRYL 0 CTX 36 (SUTURE) ×2
SUT PLAIN 2 0 (SUTURE)
SUT PLAIN 2 0 XLH (SUTURE) IMPLANT
SUT PLAIN ABS 2-0 CT1 27XMFL (SUTURE) IMPLANT
SUT VIC AB 0 CTX 36 (SUTURE) ×2
SUT VIC AB 0 CTX36XBRD ANBCTRL (SUTURE) ×1 IMPLANT
SUT VIC AB 4-0 KS 27 (SUTURE) IMPLANT
SYR 20CC LL (SYRINGE) ×4 IMPLANT
TOWEL OR 17X24 6PK STRL BLUE (TOWEL DISPOSABLE) ×6 IMPLANT
TRAY FOLEY CATH 14FR (SET/KITS/TRAYS/PACK) IMPLANT
WATER STERILE IRR 1000ML POUR (IV SOLUTION) ×1 IMPLANT

## 2012-10-29 NOTE — Progress Notes (Signed)
Asked patient to sit on side of bed and possibly stand at bedside with RN assistance, patient refused and said "give me 20 minutes and I will". RN told patient she would be back then and we would stand, reinforced the importance of getting her mobile.

## 2012-10-29 NOTE — Progress Notes (Signed)
I visited with Corrie Dandy, a good friend of Aramis who was in the room with her Digiulio was sleeping).  This is Carla Brooks's first experience with the NICU even though it is her third child.  She seems to have good support from her friend.  We will check in with her later.  [employer] Pager, [phone] 10:41 AM   10/29/12 1000  Clinical Encounter Type  Visited With Family;Patient not available  Visit Type Initial

## 2012-10-29 NOTE — Progress Notes (Signed)
Pt c/o SOB.  O2 sats WNL. Pt states contractions are more intense. Foley bulb out with increased bloody show noted. Pt repositioned to high fowlers. O2 via facemask continues.  Dr Ermalinda Memos notified.  Will come to assess.

## 2012-10-29 NOTE — Anesthesia Postprocedure Evaluation (Signed)
Anesthesia Post Note  Patient: Carla Brooks  Procedure(s) Performed: Procedure(s): CESAREAN SECTION (N/A)  Anesthesia type: Spinal  Patient location: Mother Baby  Post pain: Pain level controlled  Post assessment: Post-op Vital signs reviewed  Last Vitals: BP 149/79  Pulse 90  Temp(Src) 37.2 C (Oral)  Resp 24  Ht 5\' 9"  (1.753 m)  Wt 198 lb 6.4 oz (89.994 kg)  BMI 29.29 kg/m2  SpO2 95%  LMP 03/21/2012  Post vital signs: Reviewed  Level of consciousness: awake  Complications: No apparent anesthesia complications

## 2012-10-29 NOTE — Anesthesia Preprocedure Evaluation (Signed)
Anesthesia Evaluation  Patient identified by MRN, date of birth, ID band Patient awake    Reviewed: Allergy & Precautions, H&P , Patient's Chart, lab work & pertinent test results  Airway Mallampati: II TM Distance: >3 FB Neck ROM: full    Dental no notable dental hx.    Pulmonary asthma ,  breath sounds clear to auscultation  Pulmonary exam normal       Cardiovascular Exercise Tolerance: Good hypertension, On Medications Rhythm:regular Rate:Normal     Neuro/Psych    GI/Hepatic GERD-  Medicated,  Endo/Other  diabetes, Gestational, Insulin Dependent  Renal/GU      Musculoskeletal   Abdominal   Peds  Hematology  (+) anemia ,   Anesthesia Other Findings DM...Marland KitchenMarland KitchenLast BS 136 Lasix given with 1liter diuresis, No CXR; no rales on exam. Reportedly had O2 Sat's of 93% pre-lasix.  BP controlled with Labatolol,Hydralizine and Mg+2 Blood available, Nl Plts and anemic.   Reproductive/Obstetrics                           Anesthesia Physical Anesthesia Plan  ASA: III  Anesthesia Plan: Spinal   Post-op Pain Management:    Induction:   Airway Management Planned:   Additional Equipment:   Intra-op Plan:   Post-operative Plan:   Informed Consent: I have reviewed the patients History and Physical, chart, labs and discussed the procedure including the risks, benefits and alternatives for the proposed anesthesia with the patient or authorized representative who has indicated his/her understanding and acceptance.   Dental Advisory Given  Plan Discussed with: CRNA  Anesthesia Plan Comments: (Lab work confirmed with CRNA in room. Platelets okay. Discussed spinal anesthetic, and patient consents to the procedure:  included risk of possible headache,backache, failed block, allergic reaction, and nerve injury. This patient was asked if she had any questions or concerns before the procedure started. )         Anesthesia Quick Evaluation

## 2012-10-29 NOTE — Progress Notes (Signed)
Magnesium gtt stopped per MD order. Patient informed, patient still will not open her eyes but for a second.

## 2012-10-29 NOTE — Progress Notes (Signed)
Carla Brooks is a 42 y.o. G3P2002 at [redacted]w[redacted]d  admitted for induction of labor due to Pre-eclamptic toxemia of pregnancy..  Subjective: Dyspnea worsened then improved, interpreting contractiuons as mild.   Objective: BP 139/66  Pulse 97  Temp(Src) 98.6 F (37 C) (Oral)  Resp 20  Ht 5\' 9"  (1.753 m)  Wt 89.812 kg (198 lb)  BMI 29.23 kg/m2  SpO2 100%  LMP 03/21/2012 Total I/O In: 575 [P.O.:50; I.V.:525] Out: 1930 [Urine:1930] Received 2nd dose of IV hydralazine for severe range B/P Still wearing O2 via facemask  FHT:  FHR: 140 bpm, variability: minimal ,  accelerations:  Abscent,  decelerations:  Present variables and lates UC:   irregular, every 2-4 min, IUPC placed, MVU 200 SVE:   Foley bulb out Amnioinfusion started- 300 mL bolus and 144ml/hr  Dilation: 4 Effacement (%): Thick Cervical Position: Middle Station: -1 Presentation: Vertex Exam by:: DrEure   Results for orders placed during the hospital encounter of 10/26/12 (from the past 24 hour(s))  GLUCOSE, CAPILLARY     Status: None   Collection Time    10/28/12  6:43 AM      Result Value Range   Glucose-Capillary 70  70 - 99 mg/dL  COMPREHENSIVE METABOLIC PANEL     Status: Abnormal   Collection Time    10/28/12  7:55 AM      Result Value Range   Sodium 133 (*) 135 - 145 mEq/L   Potassium 3.7  3.5 - 5.1 mEq/L   Chloride 102  96 - 112 mEq/L   CO2 18 (*) 19 - 32 mEq/L   Glucose, Bld 73  70 - 99 mg/dL   BUN 9  6 - 23 mg/dL   Creatinine, Ser 1.61  0.50 - 1.10 mg/dL   Calcium 7.5 (*) 8.4 - 10.5 mg/dL   Total Protein 6.2  6.0 - 8.3 g/dL   Albumin 2.4 (*) 3.5 - 5.2 g/dL   AST 12  0 - 37 U/L   ALT 9  0 - 35 U/L   Alkaline Phosphatase 105  39 - 117 U/L   Total Bilirubin 0.2 (*) 0.3 - 1.2 mg/dL   GFR calc non Af Amer 80 (*) >90 mL/min   GFR calc Af Amer >90  >90 mL/min  CBC     Status: Abnormal   Collection Time    10/28/12  7:55 AM      Result Value Range   WBC 13.1 (*) 4.0 - 10.5 K/uL   RBC 3.07 (*) 3.87  - 5.11 MIL/uL   Hemoglobin 8.5 (*) 12.0 - 15.0 g/dL   HCT 09.6 (*) 04.5 - 40.9 %   MCV 85.3  78.0 - 100.0 fL   MCH 27.7  26.0 - 34.0 pg   MCHC 32.4  30.0 - 36.0 g/dL   RDW 81.1  91.4 - 78.2 %   Platelets 347  150 - 400 K/uL  GLUCOSE, CAPILLARY     Status: None   Collection Time    10/28/12 11:44 AM      Result Value Range   Glucose-Capillary 92  70 - 99 mg/dL  GLUCOSE, CAPILLARY     Status: Abnormal   Collection Time    10/28/12  4:06 PM      Result Value Range   Glucose-Capillary 109 (*) 70 - 99 mg/dL  MAGNESIUM     Status: Abnormal   Collection Time    10/28/12  7:55 PM      Result Value Range  Magnesium 5.7 (*) 1.5 - 2.5 mg/dL  GLUCOSE, CAPILLARY     Status: Abnormal   Collection Time    10/28/12  8:08 PM      Result Value Range   Glucose-Capillary 108 (*) 70 - 99 mg/dL    Labs: Lab Results  Component Value Date   WBC 13.1* 10/28/2012   HGB 8.5* 10/28/2012   HCT 26.2* 10/28/2012   MCV 85.3 10/28/2012   PLT 347 10/28/2012    Assessment / Plan: IOL for preeclampsia, severe, ripeining phase Dr. Despina Hidden reviewed strip Labor: active, AROM with clear fluid Fetal Wellbeing:  Category II Pain Control:  Labor support without medications Anticipated MOD:  NSVD  Kevin Fenton 10/29/2012, 12:53 AM

## 2012-10-29 NOTE — Progress Notes (Addendum)
Patient ID: Carla Brooks, female   DOB: 12-Oct-1970, 42 y.o.   MRN: [id number] Patient is now having repetitive late decelerations and has loss of variability.  The lates started about 45 minutes ago and have not improved.  As a result we are proceeding with a Caesarean section.  Patient adamantly wants a tubal ligation.  Carla Brooks H 10/29/2012 2:30 AM

## 2012-10-29 NOTE — Op Note (Addendum)
Preoperative diagnosis:  1.  Intrauterine pregnancy at 32 2/[redacted] weeks gestation                                         2.  Chronic Hypertension                                         3.  Class B Diabetes                                         4.  Superimposed pre eclampsia with severe features                                         5.  Repetitive late decelerations                                         6.  Fibroid uterus                                         7.  Desires sterilization   Postoperative diagnosis:  Same as above plus unable to do tubal ligation because of size of fibroid uterus  Procedure:  Primary cesarean section  Surgeon:  Lazaro Arms MD  Assistant:    Anesthesia: Spinal  Findings:  Patient was being induced for preeclampsia superimposing chronic hypertension with severe features namely blood pressure.  However in labor she developed cord compression and an amnioinfusion which helped to some degree but then began the baby began developing a repetitive late decelerations.  She was only 4 cm at the time and was not appropriate continue in labor at this point.  After discussing with patient resides proceed with a primary cesarean section she did want a tubal ligation but the uterus was so big because of the thyroid was a couldn't deliver it through the incision her get to her tubes any other way in order perform and she will have performed as an outpatient 6-8 weeks postpartum laparoscopically.  I am hopeful and the absence of producing hormones the uterus will involute and the fibroids were also significantly shrink.  Over a low transverse incision was delivered a viable female with Apgars of 2/7 and  weighing pending 3 lbs. 6.7 oz.   Description of operation:  Patient was taken to the operating room and placed in the sitting position where she underwent a spinal anesthetic. She was then placed in the supine position with tilt to the left side. When adequate anesthetic  level was obtained she was prepped and draped in usual sterile fashion and a Foley catheter was placed. A Pfannenstiel skin incision was made and carried down sharply to the rectus fascia which was scored in the midline extended laterally. The fascia was taken off the muscles both superiorly and without difficulty. The muscles were divided.  The peritoneal cavity was entered.  Bladder blade was placed, no  bladder flap was created.  A low transverse hysterotomy incision was made and delivered a viable female  infant at  with Apgars 2/7 of  and  Weighing 3 lbs 6.7 oz.  Cord pH was obtained and was 7.14. The uterus was exteriorized. It was closed in 2 layers, the first being a running interlocking layer and the second being an imbricating layer using 0 monocryl on a CTX needle. There was good resulting hemostasis. The uterus had  Multiple fibroids.  Peritoneal cavity was irrigated vigorously. The muscles and peritoneum were reapproximated loosely. The fascia was closed using 0 Vicryl in running fashion. Subcutaneous tissue was made hemostatic and irrigated. The skin was closed using 4-0 Vicryl on a Keith needle in a subcuticular fashion.  Dermabond was placed for additional wound integrity and to serve as a barrier. Blood loss for the procedure was 500 cc. The patient received a gentimicin and cleocin prophylactically. The patient was taken to the recovery room in good stable condition with all counts being correct x3.  EBL 500 cc  Carla Brooks,Carla Brooks 10/29/2012 3:44 AM

## 2012-10-29 NOTE — Progress Notes (Signed)
Patient refusing to get up at bedside to stand or sit on side of bed, states "I'm too dizzy I can't right now", give me 15 minutes. Awaiting Magnesium level result from lab. Friend at bedside states "She's not usually like this". Explained side effects of Magnesium.

## 2012-10-29 NOTE — Transfer of Care (Signed)
Immediate Anesthesia Transfer of Care Note  Patient: Carla Brooks  Procedure(s) Performed: Procedure(s): CESAREAN SECTION (N/A)  Patient Location: PACU  Anesthesia Type:spinal  Level of Consciousness: sedated  Airway & Oxygen Therapy: Patient Spontanous Breathing  Post-op Assessment: Report given to PACU RN and Post -op Vital signs reviewed and stable  Post vital signs: stable  Complications: No apparent anesthesia complications

## 2012-10-29 NOTE — Anesthesia Procedure Notes (Signed)
Spinal  Patient location during procedure: OR Preanesthetic Checklist Completed: patient identified, site marked, surgical consent, pre-op evaluation, timeout performed, IV checked, risks and benefits discussed and monitors and equipment checked Spinal Block Patient position: sitting Prep: DuraPrep Patient monitoring: heart rate, cardiac monitor, continuous pulse ox and blood pressure Approach: midline Location: L3-4 Injection technique: single-shot Needle Needle type: Sprotte  Needle gauge: 24 G Needle length: 9 cm Assessment Sensory level: T4 Additional Notes Spinal Dosage in OR  Bupivicaine ml       1.8 PFMS04   mcg        150 Fentanyl mcg            25    

## 2012-10-29 NOTE — Progress Notes (Signed)
Magnesium gtt IV restarted at 1 gram/hour. Attempted again to get patient to at least sit on side of bed, refused saying "I can't, give me 30 more minutes." Incentive spirometer done to 1000.

## 2012-10-29 NOTE — Progress Notes (Signed)
UR chart review completed.  

## 2012-10-29 NOTE — Anesthesia Postprocedure Evaluation (Signed)
  Anesthesia Post-op Note  Patient: Carla Brooks  Procedure(s) Performed: Procedure(s): CESAREAN SECTION (N/A)   Patient is awake, responsive, moving her legs, and has signs of resolution of her numbness. Pain and nausea are reasonably well controlled. Vital signs are stable and clinically acceptable. Oxygen saturation is clinically acceptable. There are no apparent anesthetic complications at this time. Patient is ready for discharge.

## 2012-10-30 ENCOUNTER — Encounter (HOSPITAL_COMMUNITY): Payer: Self-pay | Admitting: Obstetrics & Gynecology

## 2012-10-30 LAB — COMPREHENSIVE METABOLIC PANEL
Albumin: 1.9 g/dL — ABNORMAL LOW (ref 3.5–5.2)
BUN: 17 mg/dL (ref 6–23)
Calcium: 7.4 mg/dL — ABNORMAL LOW (ref 8.4–10.5)
Creatinine, Ser: 1.04 mg/dL (ref 0.50–1.10)
Total Protein: 5.2 g/dL — ABNORMAL LOW (ref 6.0–8.3)

## 2012-10-30 LAB — TYPE AND SCREEN
ABO/RH(D): A POS
Antibody Screen: NEGATIVE
Unit division: 0
Unit division: 0

## 2012-10-30 LAB — CBC
HCT: 21.3 % — ABNORMAL LOW (ref 36.0–46.0)
Hemoglobin: 6.9 g/dL — CL (ref 12.0–15.0)
MCV: 85.9 fL (ref 78.0–100.0)
Platelets: 279 10*3/uL (ref 150–400)
RBC: 2.48 MIL/uL — ABNORMAL LOW (ref 3.87–5.11)
WBC: 14.9 10*3/uL — ABNORMAL HIGH (ref 4.0–10.5)

## 2012-10-30 MED ORDER — HYDROCHLOROTHIAZIDE 25 MG PO TABS
25.0000 mg | ORAL_TABLET | Freq: Every day | ORAL | Status: DC
  Administered 2012-10-30 – 2012-11-01 (×3): 25 mg via ORAL
  Filled 2012-10-30 (×3): qty 1

## 2012-10-30 MED ORDER — HYDRALAZINE HCL 20 MG/ML IJ SOLN
10.0000 mg | Freq: Once | INTRAMUSCULAR | Status: AC
  Administered 2012-10-30: 10 mg via INTRAVENOUS
  Filled 2012-10-30: qty 1

## 2012-10-30 MED ORDER — AMLODIPINE BESYLATE 5 MG PO TABS
5.0000 mg | ORAL_TABLET | Freq: Every day | ORAL | Status: DC
  Administered 2012-10-30 – 2012-10-31 (×2): 5 mg via ORAL
  Filled 2012-10-30 (×3): qty 1

## 2012-10-30 NOTE — Progress Notes (Signed)
Subjective: Postpartum Day 1: Cesarean Delivery Patient reports no headache or vision changes, no RUQ pain. Mild incisional pain. Foley still in. Has taken PO fluids but not food yet. Unsure about flatus, no BM. Lochia normal per RN.   Objective: Vital signs in last 24 hours: Temp:  [97.5 F (36.4 C)-99.3 F (37.4 C)] 98.2 F (36.8 C) (02/28 0400) Pulse Rate:  [74-99] 91 (02/28 0600) Resp:  [16-25] 18 (02/28 0600) BP: (134-176)/(67-98) 134/67 mmHg (02/28 0600) SpO2:  [93 %-100 %] 98 % (02/28 0600) Weight:  [89.313 kg (196 lb 14.4 oz)-89.994 kg (198 lb 6.4 oz)] 89.313 kg (196 lb 14.4 oz) (02/28 0555)  Filed Vitals:   10/30/12 0400 10/30/12 0500 10/30/12 0555 10/30/12 0600  BP: 163/91 168/84  134/67  Pulse: 84 80 91 91  Temp: 98.2 F (36.8 C)     TempSrc: Oral     Resp: 18 16  18   Height:      Weight:   89.313 kg (196 lb 14.4 oz)   SpO2: 97% 97% 100% 98%   Physical Exam:  General: cooperative, no distress and drowsy Lochia: appropriate Uterine Fundus: firm ABD:  No RUQ tenderness Incision: healing well, no significant drainage, no dehiscence, no significant erythema DVT Evaluation: No evidence of DVT seen on physical exam. Negative Homan's sign. No cords or calf tenderness. Calf/Ankle edema is present - mild   Recent Labs  10/29/12 1156 10/30/12 0520  HGB 8.0* 6.9*  HCT 24.7* 21.3*   Results for orders placed during the hospital encounter of 10/26/12 (from the past 24 hour(s))  CBC     Status: Abnormal   Collection Time    10/29/12 11:56 AM      Result Value Range   WBC 20.2 (*) 4.0 - 10.5 K/uL   RBC 2.89 (*) 3.87 - 5.11 MIL/uL   Hemoglobin 8.0 (*) 12.0 - 15.0 g/dL   HCT 65.7 (*) 84.6 - 96.2 %   MCV 85.5  78.0 - 100.0 fL   MCH 27.7  26.0 - 34.0 pg   MCHC 32.4  30.0 - 36.0 g/dL   RDW 95.2  84.1 - 32.4 %   Platelets 312  150 - 400 K/uL  COMPREHENSIVE METABOLIC PANEL     Status: Abnormal   Collection Time    10/29/12 11:56 AM      Result Value Range   Sodium 134 (*) 135 - 145 mEq/L   Potassium 4.0  3.5 - 5.1 mEq/L   Chloride 101  96 - 112 mEq/L   CO2 23  19 - 32 mEq/L   Glucose, Bld 146 (*) 70 - 99 mg/dL   BUN 14  6 - 23 mg/dL   Creatinine, Ser 4.01  0.50 - 1.10 mg/dL   Calcium 7.4 (*) 8.4 - 10.5 mg/dL   Total Protein 6.1  6.0 - 8.3 g/dL   Albumin 2.3 (*) 3.5 - 5.2 g/dL   AST 13  0 - 37 U/L   ALT 8  0 - 35 U/L   Alkaline Phosphatase 95  39 - 117 U/L   Total Bilirubin 0.3  0.3 - 1.2 mg/dL   GFR calc non Af Amer 70 (*) >90 mL/min   GFR calc Af Amer 81 (*) >90 mL/min  MAGNESIUM     Status: Abnormal   Collection Time    10/29/12 11:56 AM      Result Value Range   Magnesium 6.7 (*) 1.5 - 2.5 mg/dL  COMPREHENSIVE METABOLIC PANEL  Status: Abnormal   Collection Time    10/30/12  5:20 AM      Result Value Range   Sodium 138  135 - 145 mEq/L   Potassium 3.9  3.5 - 5.1 mEq/L   Chloride 106  96 - 112 mEq/L   CO2 24  19 - 32 mEq/L   Glucose, Bld 91  70 - 99 mg/dL   BUN 17  6 - 23 mg/dL   Creatinine, Ser 1.61  0.50 - 1.10 mg/dL   Calcium 7.4 (*) 8.4 - 10.5 mg/dL   Total Protein 5.2 (*) 6.0 - 8.3 g/dL   Albumin 1.9 (*) 3.5 - 5.2 g/dL   AST 15  0 - 37 U/L   ALT 8  0 - 35 U/L   Alkaline Phosphatase 76  39 - 117 U/L   Total Bilirubin 0.3  0.3 - 1.2 mg/dL   GFR calc non Af Amer 67 (*) >90 mL/min   GFR calc Af Amer 77 (*) >90 mL/min  CBC     Status: Abnormal   Collection Time    10/30/12  5:20 AM      Result Value Range   WBC 14.9 (*) 4.0 - 10.5 K/uL   RBC 2.48 (*) 3.87 - 5.11 MIL/uL   Hemoglobin 6.9 (*) 12.0 - 15.0 g/dL   HCT 09.6 (*) 04.5 - 40.9 %   MCV 85.9  78.0 - 100.0 fL   MCH 27.8  26.0 - 34.0 pg   MCHC 32.4  30.0 - 36.0 g/dL   RDW 81.1  91.4 - 78.2 %   Platelets 279  150 - 400 K/uL    Assessment/Plan: 1.  Status post Cesarean section. Doing well postoperatively.  2.   Preeclampsia   - Mag off this morning.    -  BP still elevated in 160s/90s.    -  Received IV Hydralazine --> 134/67.   -  Still on Labetalol 600  PO q 6 hours. Dose at 4 AM  -  Started HCTZ 25 mg PO.   -  Will monitor BP closely and cut back on labetalol as indicated.  -  CMP/platelets normal today. 3.  Anemia:  Hgb 8 -->6.9. Has not been out of bed yet. Pulse normal. No lightheadedness at rest.  4.  Bottlefeeding, plans BTL outpt (unable to do at c-section due to large fibroids).  5.  Transfer to floor this morning if BP stable. Routine post-op care. Baby in NICU.  Napoleon Form 10/30/2012, 6:09 AM

## 2012-10-30 NOTE — Progress Notes (Signed)
   I met Carla Brooks in the NICU where she was seeing her baby for the first time.  I introduced myself and let her know that we are available for extra support, then left so that she could spend time with her daughter.    Please page as needs arise, [phone]  Kathleen Argue 3:42 PM   10/30/12 1500  Clinical Encounter Type  Visited With Patient;Patient and family together  Visit Type Initial

## 2012-10-31 MED ORDER — HYDRALAZINE HCL 10 MG PO TABS
10.0000 mg | ORAL_TABLET | Freq: Four times a day (QID) | ORAL | Status: DC | PRN
  Administered 2012-10-31 – 2012-11-01 (×2): 10 mg via ORAL
  Filled 2012-10-31 (×2): qty 1

## 2012-10-31 MED ORDER — METFORMIN HCL 500 MG PO TABS
1000.0000 mg | ORAL_TABLET | Freq: Two times a day (BID) | ORAL | Status: DC
  Administered 2012-10-31 – 2012-11-02 (×5): 1000 mg via ORAL
  Filled 2012-10-31 (×6): qty 2

## 2012-10-31 MED ORDER — AMLODIPINE BESYLATE 5 MG PO TABS
5.0000 mg | ORAL_TABLET | Freq: Once | ORAL | Status: AC
  Administered 2012-10-31: 5 mg via ORAL
  Filled 2012-10-31: qty 1

## 2012-10-31 MED ORDER — LISINOPRIL 10 MG PO TABS
10.0000 mg | ORAL_TABLET | Freq: Every day | ORAL | Status: DC
  Administered 2012-10-31 – 2012-11-01 (×2): 10 mg via ORAL
  Filled 2012-10-31 (×2): qty 1

## 2012-10-31 MED ORDER — AMLODIPINE BESYLATE 10 MG PO TABS
10.0000 mg | ORAL_TABLET | Freq: Every day | ORAL | Status: DC
  Filled 2012-10-31: qty 1

## 2012-10-31 NOTE — Progress Notes (Signed)
Subjective: Postpartum Day #2: Cesarean Delivery Patient reports tolerating PO, + flatus and no problems voiding; infant doing well in NICU- plans to bottlefeed; has signed papers for interval tubal  Objective: Vital signs in last 24 hours: Temp:  [98.1 F (36.7 C)-98.8 F (37.1 C)] 98.8 F (37.1 C) (03/01 0615) Pulse Rate:  [88-102] 97 (03/01 0615) Resp:  [18-20] 18 (03/01 0615) BP: (151-165)/(72-81) 154/80 mmHg (03/01 0615) SpO2:  [93 %-100 %] 98 % (03/01 0615)  Physical Exam:  General: alert, cooperative and mild distress Lochia: appropriate Uterine Fundus: firm Incision: healing well, no significant drainage, no dehiscence DVT Evaluation: No evidence of DVT seen on physical exam.   Recent Labs  10/29/12 1156 10/30/12 0520  HGB 8.0* 6.9*  HCT 24.7* 21.3*    Assessment/Plan: Status post Cesarean section. Doing well postoperatively.  Continue current care. At rounds will review BPs and determine any med changes  Cam Hai 10/31/2012, 7:36 AM

## 2012-11-01 MED ORDER — LISINOPRIL 20 MG PO TABS
20.0000 mg | ORAL_TABLET | Freq: Every day | ORAL | Status: DC
  Filled 2012-11-01: qty 1

## 2012-11-01 MED ORDER — FERROUS SULFATE 325 (65 FE) MG PO TABS
325.0000 mg | ORAL_TABLET | Freq: Two times a day (BID) | ORAL | Status: DC
  Administered 2012-11-01 – 2012-11-02 (×3): 325 mg via ORAL
  Filled 2012-11-01 (×3): qty 1

## 2012-11-01 MED ORDER — LISINOPRIL 10 MG PO TABS
10.0000 mg | ORAL_TABLET | Freq: Once | ORAL | Status: AC
  Administered 2012-11-01: 10 mg via ORAL
  Filled 2012-11-01: qty 1

## 2012-11-01 MED ORDER — DOCUSATE SODIUM 100 MG PO CAPS
100.0000 mg | ORAL_CAPSULE | Freq: Two times a day (BID) | ORAL | Status: DC
  Administered 2012-11-01 – 2012-11-02 (×3): 100 mg via ORAL
  Filled 2012-11-01 (×4): qty 1

## 2012-11-01 MED ORDER — HYDROCHLOROTHIAZIDE 50 MG PO TABS
50.0000 mg | ORAL_TABLET | Freq: Every day | ORAL | Status: DC
  Administered 2012-11-02: 50 mg via ORAL
  Filled 2012-11-01 (×2): qty 1

## 2012-11-01 MED ORDER — DEXTROMETHORPHAN POLISTIREX 30 MG/5ML PO LQCR
15.0000 mg | Freq: Two times a day (BID) | ORAL | Status: DC
  Administered 2012-11-01: 15 mg via ORAL
  Filled 2012-11-01 (×4): qty 5

## 2012-11-01 MED ORDER — LISINOPRIL 40 MG PO TABS
40.0000 mg | ORAL_TABLET | Freq: Every day | ORAL | Status: DC
  Administered 2012-11-02: 40 mg via ORAL
  Filled 2012-11-01 (×2): qty 1

## 2012-11-01 MED ORDER — HYDROCHLOROTHIAZIDE 25 MG PO TABS
25.0000 mg | ORAL_TABLET | Freq: Once | ORAL | Status: AC
  Administered 2012-11-01: 25 mg via ORAL
  Filled 2012-11-01: qty 1

## 2012-11-01 MED ORDER — LISINOPRIL 20 MG PO TABS
20.0000 mg | ORAL_TABLET | ORAL | Status: AC
  Administered 2012-11-01: 20 mg via ORAL
  Filled 2012-11-01: qty 1

## 2012-11-01 NOTE — Progress Notes (Signed)
Subjective: Postpartum Day 3: Cesarean Delivery Patient reports tolerating PO, + flatus and no problems voiding; infant doing well in NICU- plans to bottlefeed; has signed papers for interval tubal sterilization. Denies any preeclampsia symptoms.  She had several sever range BP and required a two doses of hydralazine and adjustment of her antihypertensive regimen since yesterday.  Currently on Lisinopril 20 mg and HCTZ 50 mg daily.    Objective: Vital signs in last 24 hours: Temp:  [98.1 F (36.7 C)-98.7 F (37.1 C)] 98.1 F (36.7 C) (03/02 0837) Pulse Rate:  [89-100] 94 (03/02 0837) Resp:  [16-19] 16 (03/02 0745) BP: (150-176)/(76-100) 157/98 mmHg (03/02 0837) SpO2:  [97 %-100 %] 100 % (03/02 0745)  Physical Exam:  General: alert and no distress Lochia: appropriate Uterine Fundus: firm Incision: healing well, no significant drainage, no dehiscence, no significant erythema DVT Evaluation: No evidence of DVT seen on physical exam. Negative Homan's sign.   Recent Labs  10/29/12 1156 10/30/12 0520  HGB 8.0* 6.9*  HCT 24.7* 21.3*    Assessment/Plan: Status post Cesarean section for CHTN and superimposed preeclampsia, Class B DM at [redacted] weeks GA.  Baby doing well in NICU.  WIll continue BP control; if remains stable, she may be discharged later today.  For now, will continue current postpartum care.  Tereso Newcomer, M.D. 11/01/2012, 9:09 AM
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
## 2012-11-02 ENCOUNTER — Encounter: Payer: Medicaid Other | Admitting: Advanced Practice Midwife

## 2012-11-02 ENCOUNTER — Encounter: Payer: Self-pay | Admitting: *Deleted

## 2012-11-02 MED ORDER — OXYCODONE-ACETAMINOPHEN 5-325 MG PO TABS: 1.0000 | ORAL_TABLET | ORAL | Status: DC | PRN

## 2012-11-02 MED ORDER — LISINOPRIL-HYDROCHLOROTHIAZIDE 20-25 MG PO TABS: 2.0000 | ORAL_TABLET | Freq: Every day | ORAL | Status: DC

## 2012-11-02 MED ORDER — METFORMIN HCL 1000 MG PO TABS: 1000.0000 mg | ORAL_TABLET | Freq: Two times a day (BID) | ORAL | Status: DC

## 2012-11-02 MED ORDER — IBUPROFEN 600 MG PO TABS: 600.0000 mg | ORAL_TABLET | Freq: Four times a day (QID) | ORAL | Status: DC

## 2012-11-02 MED ORDER — DOCUSATE SODIUM 100 MG PO CAPS: 100.0000 mg | ORAL_CAPSULE | Freq: Two times a day (BID) | ORAL | Status: DC | PRN

## 2012-11-02 MED ORDER — FERROUS SULFATE 325 (65 FE) MG PO TABS: 325.0000 mg | ORAL_TABLET | Freq: Two times a day (BID) | ORAL | Status: DC

## 2012-11-02 NOTE — Progress Notes (Signed)
Subjective: Postpartum Day 4: Cesarean Delivery Patient reports incisional pain, tolerating PO, + BM and no problems voiding.  Denies headache, vision changes, RUQ pain.  Objective: Vital signs in last 24 hours: Temp:  [98.1 F (36.7 C)-98.6 F (37 C)] 98.4 F (36.9 C) (03/03 0609) Pulse Rate:  [85-103] 92 (03/03 0609) Resp:  [16] 16 (03/03 0609) BP: (153-186)/(80-110) 156/89 mmHg (03/03 0609) SpO2:  [98 %-100 %] 98 % (03/03 0609)  Filed Vitals:   11/01/12 1851 11/01/12 2107 11/02/12 0204 11/02/12 0609  BP: 167/88 161/93 166/92 156/89  Pulse: 89 89 85 92  Temp:  98.4 F (36.9 C) 98.1 F (36.7 C) 98.4 F (36.9 C)  TempSrc:  Oral Oral Oral  Resp:  16 16 16   Height:      Weight:      SpO2: 100% 98% 99% 98%      Physical Exam:  General: alert, cooperative and no distress Lochia: appropriate Uterine Fundus: firm Incision: healing well, no significant drainage, no dehiscence, no significant erythema DVT Evaluation: No evidence of DVT seen on physical exam. Negative Homan's sign. No cords or calf tenderness. No significant calf/ankle edema.  No results found for this basename: HGB, HCT,  in the last 72 hours  Assessment/Plan: 1. Status post Cesarean section. Doing well postoperatively. Routine postop care. 2.  CHTN with superimposed preeclampsia. BP difficult to control. Remains in 150-160s/80-90s on 40 lisinopril + 50 HCTZ. Pt states her BP was 160-190s pre-pregnancy. Will consider adding third med vs home on 2 meds with BP check in 3-4 days. 3.  Class A2/B DM.  On metformin. Fasting BP are fairly well controlled. 4.  Likely d/c home today. Baby in NICU.  Napoleon Form 11/02/2012, 6:30 AM

## 2012-11-02 NOTE — Progress Notes (Signed)
Carla Brooks was feeling much better physically today than during our previous visits.  She was in good spirits even though she is feeling sad to leave her baby here.  She has a 40 year old and 43 year old at home and she admitted that  balancing being at home with them and spending time here will be difficult, though manageable with the help of her sisters.     We will continue to follow up with her in NICU as we see her.  [employer] Pager, [phone] 11:24 AM.   11/02/12 1100  Clinical Encounter Type  Visited With Patient  Visit Type Follow-up

## 2012-11-02 NOTE — Progress Notes (Signed)
Pt is discharged in the care of Sister. Downstairs per ambulatory.Denies any pain or discomfort. Stable. Abdominal incision is clean and dry. Steri strips are in place. Denies any pain or discomfort. Infant to remain in nicu. Pt allow to verbalize fears about leaving infant..Understood all instructions well Questions were asked and answered.

## 2012-11-02 NOTE — Discharge Summary (Signed)
Obstetric Discharge Summary  Carla Brooks is a 42 y.o. [phone] admitted at 32w 1d for pre-pregnancy (Class B) Type II Diabetes and chronic hypertension with superimposed preeclampsia.  She received antenatal corticosteroids and magnesium sulfate for fetal neuroprotection. Blood pressures continued to increase and decision was made to deliver baby at 32w 3d. Induction of labor was started, but due to fetal intolerance of labor, primary low transverse cesarean section was performed. Patient desired BTL but due to large uterine fibroid, BTL was not performed at time of cesarean section.  Patient was continued on magnesium for 24 hours after delivery. Her blood pressure remained high despite multiple medications. On the day of discharge, her BP was in the 160s/90s. Her postpartum course was otherwise uncomplicated. Baby continues in NICU. Mother is not breastfeeding and will plan for interval laparoscopic BTL.    Filed Vitals:   11/01/12 1851 11/01/12 2107 11/02/12 0204 11/02/12 0609  BP: 167/88 161/93 166/92 156/89  Pulse: 89 89 85 92  Temp:  98.4 F (36.9 C) 98.1 F (36.7 C) 98.4 F (36.9 C)  TempSrc:  Oral Oral Oral  Resp:  16 16 16   Height:      Weight:      SpO2: 100% 98% 99% 98%     Reason for Admission: Chronic HTN, superimposed preeclampsia Class B Diabetes Prenatal Procedures: Induction of labor, fetal monitoring, MFM consult Intrapartum Procedures: Primary low transverse cesarean section Postpartum Procedures: Magnesium for preeclampsia Complications-Operative and Postpartum: Elevated blood pressures  Hemoglobin  Date Value Range Status  10/30/2012 6.9* 12.0 - 15.0 g/dL Final     REPEATED TO VERIFY     CRITICAL RESULT CALLED TO, READ BACK BY AND VERIFIED WITH:     KEYS, B AT 0602 ON 2.28.14 BY RHODESK     HCT  Date Value Range Status  10/30/2012 21.3* 36.0 - 46.0 % Final    Physical Exam:  General: Alert, cooperative, no distress Lochia: appropriate Uterine Fundus:  firm Incision: clean, dry, intact - no erythema or drainage. DVT Evaluation: no edema or tenderness, no signs DVT  Discharge Diagnoses: s/p PLTCS, Chronic hypertension, Type II diabetes, anemia  Discharge Information: Date: 11/02/2012 Activity: pelvic rest Diet: routine Medications: Metformin 1000 bid, lisinopril/HCTZ 40mg /50mg  daily, ibuprofen, FeSO4, percocet, colace Condition: Stable Instructions: refer to practice specific booklet Discharge to: home,  Baby Love nurse to visit in 2-3 days for BP check  Follow-up Information   Follow up with [facility] In 1 week. (for blood pressure check)    Contact information:   [address] [place] [address] [phone]      Follow up with [facility] In 6 weeks. (Postpartum visit)    Contact information:   [address] [place] [address] [phone]     F/U at [facility] in 6 weeks for continued diabetes and hypertension care.  Newborn Data: Live born female  Birth Weight: 3 lb 6.7 oz (1550 g) APGAR: 2, 7  Baby remains in NICU.  Napoleon Form 11/02/2012, 9:15 AM

## 2012-11-02 NOTE — Progress Notes (Signed)
Ur chart review completed.  

## 2012-11-05 ENCOUNTER — Ambulatory Visit: Payer: Medicaid Other

## 2012-12-02 ENCOUNTER — Ambulatory Visit: Payer: Medicaid Other | Admitting: Medical

## 2012-12-02 ENCOUNTER — Ambulatory Visit: Payer: Medicaid Other | Admitting: Obstetrics & Gynecology

## 2012-12-17 ENCOUNTER — Ambulatory Visit: Payer: Medicaid Other | Admitting: Obstetrics & Gynecology

## 2013-01-29 IMAGING — CT CT ANGIO CHEST
2 of 6 series · 19 of 46 positions shown · IV contrast (APPLIED)
Comparison: 02/21/2012 radiograph

CLINICAL DATA: Chest pain, shortness of breath.

CT ANGIOGRAPHY CHEST
TECHNIQUE: Multidetector CT imaging of the chest using the
standard protocol during bolus administration of intravenous
contrast. Multiplanar reconstructed images including MIPs were
obtained and reviewed to evaluate the vascular anatomy.
Contrast: 60mL OMNIPAQUE IOHEXOL 350 MG/ML SOLN

[Series 6: pulm embolism 1.0 b25f thin · axial · 0.70mm/px · z∈[+1040,+1272]mm · 16 of 256 slices shown]
[im 12/256  lung]
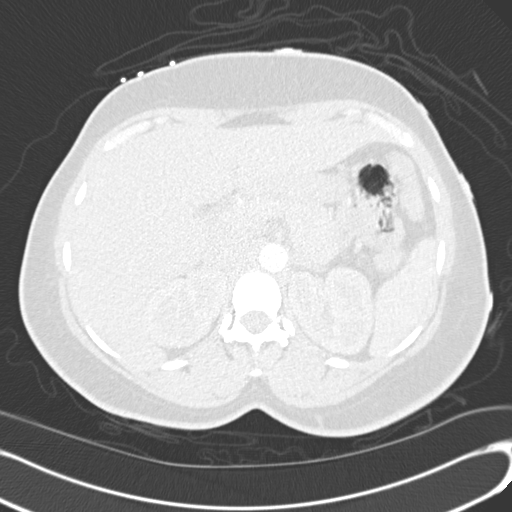
[im 34/256  soft-tissue]
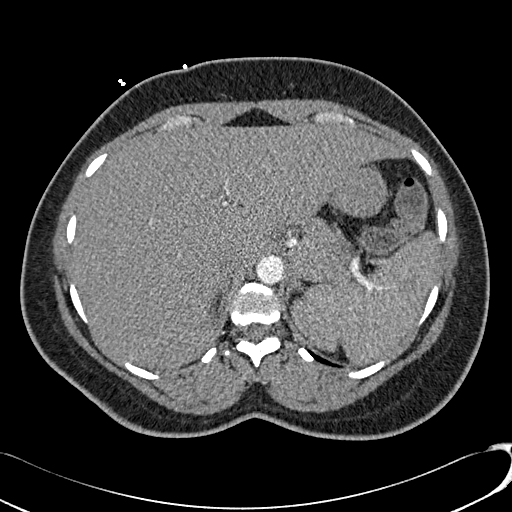
[im 45/256  lung]
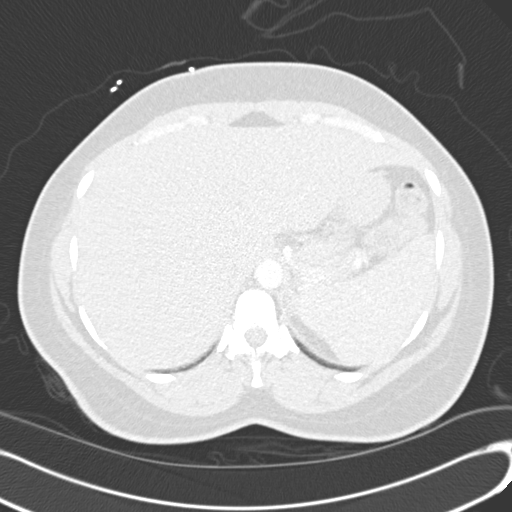
[im 56/256  soft-tissue]
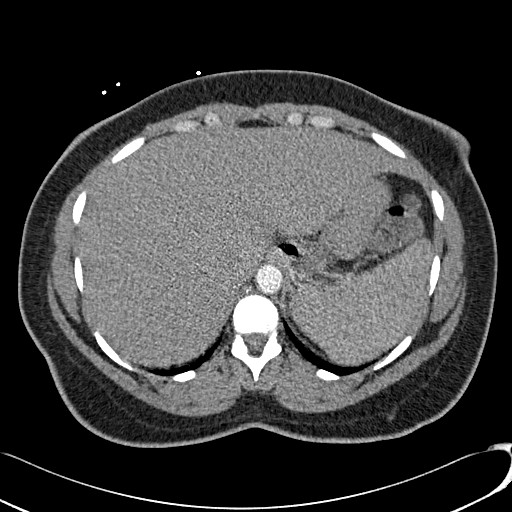
[im 78/256  lung]
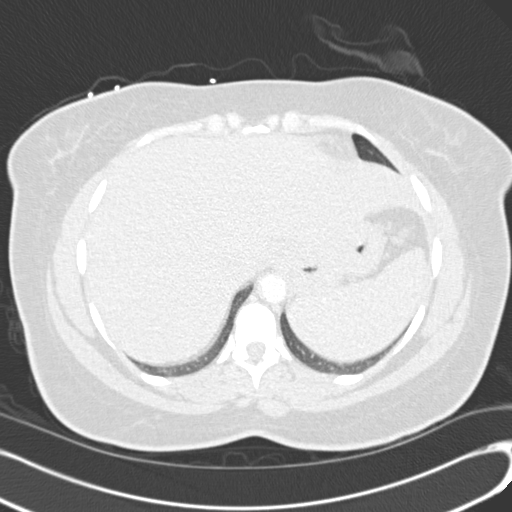
[im 89/256  soft-tissue]
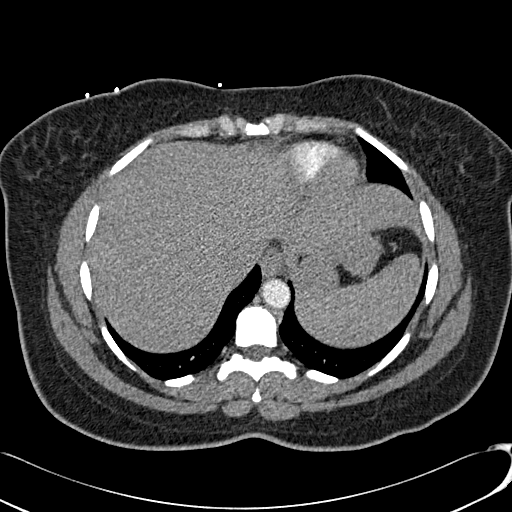
[im 100/256  lung]
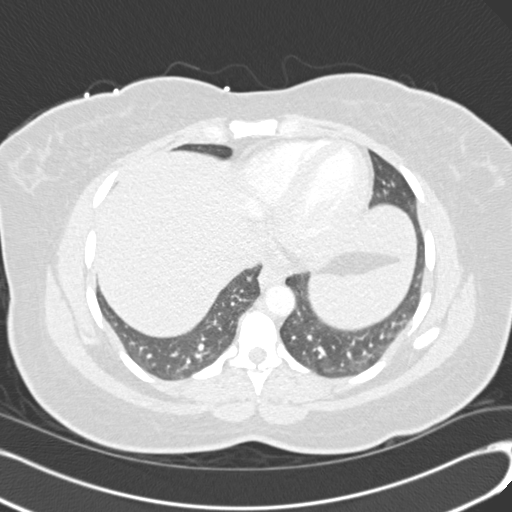
[im 122/256  soft-tissue]
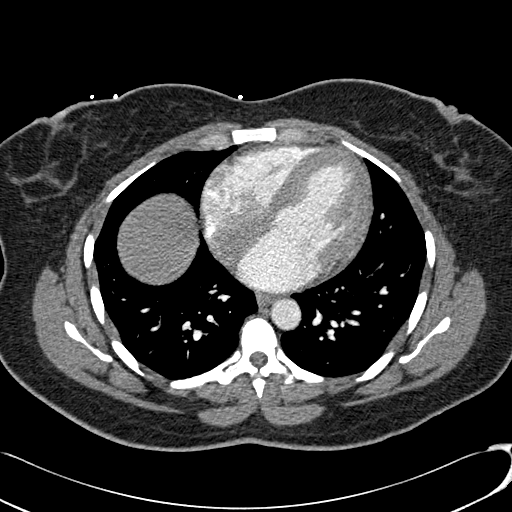
[im 134/256  lung]
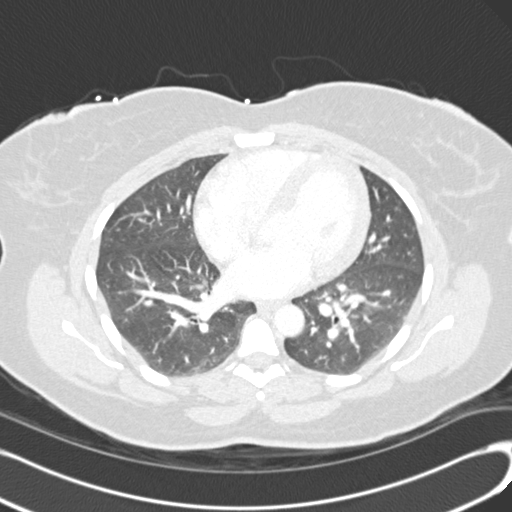
[im 156/256  soft-tissue]
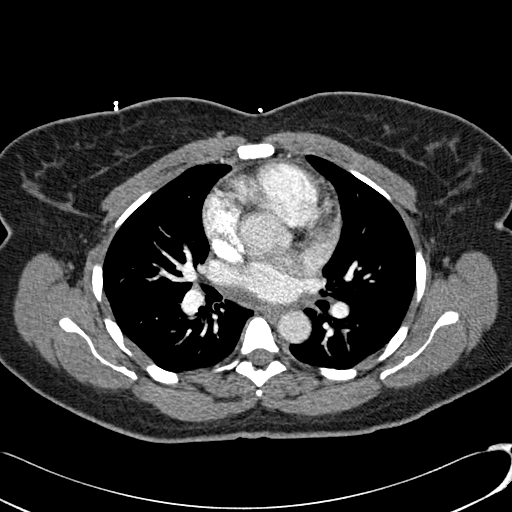
[im 167/256  lung]
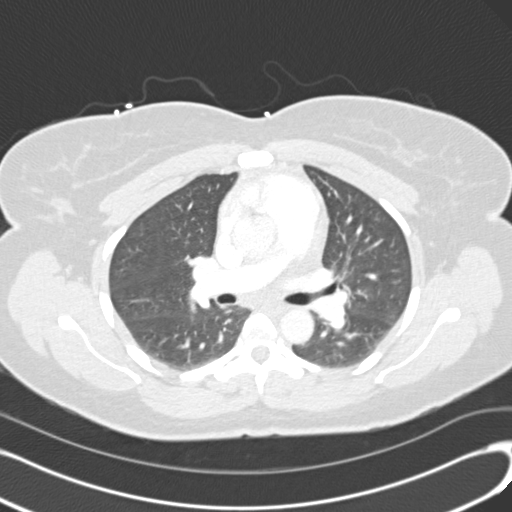
[im 178/256  soft-tissue]
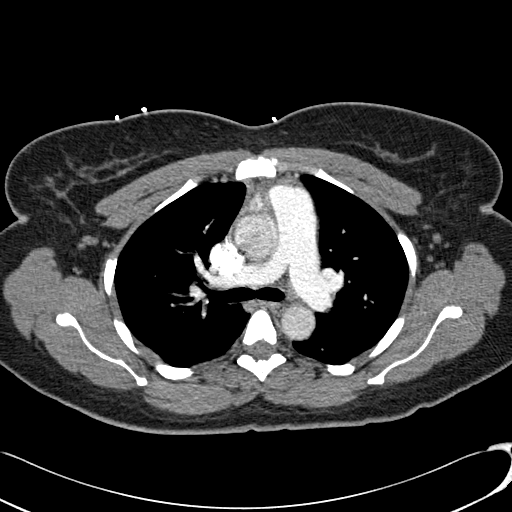
[im 200/256  lung]
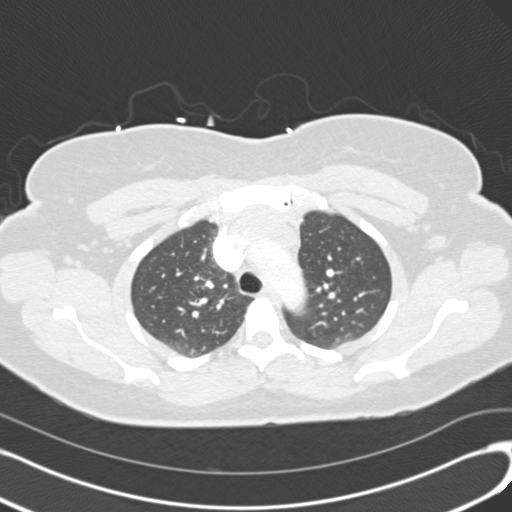
[im 211/256  soft-tissue]
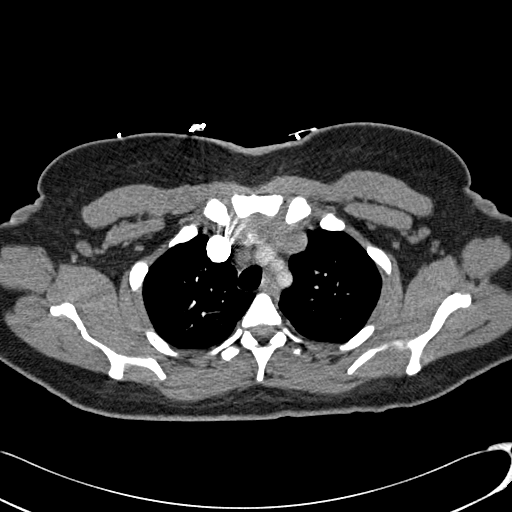
[im 222/256  lung]
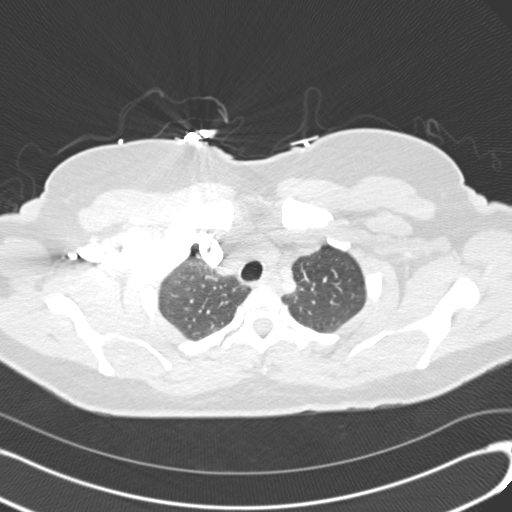
[im 244/256  soft-tissue]
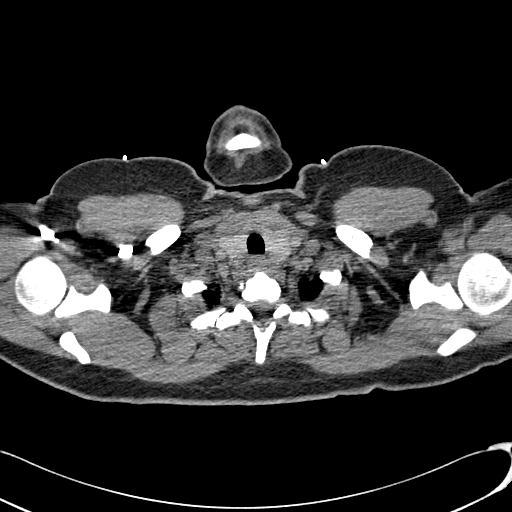

[Series 8: coronals · coronal · 0.68mm/px · 3 of 107 slices shown]
[im 27/107  soft-tissue]
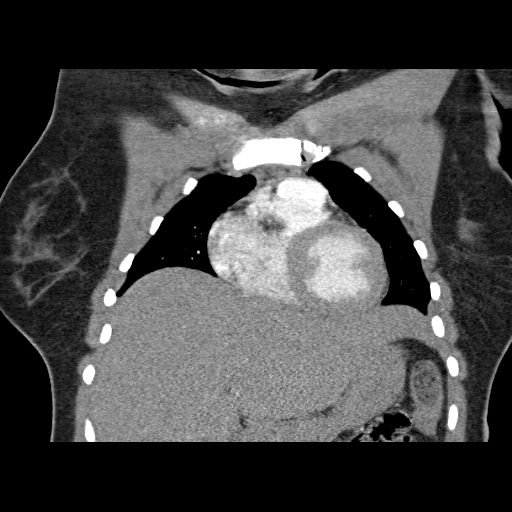
[im 54/107  soft-tissue]
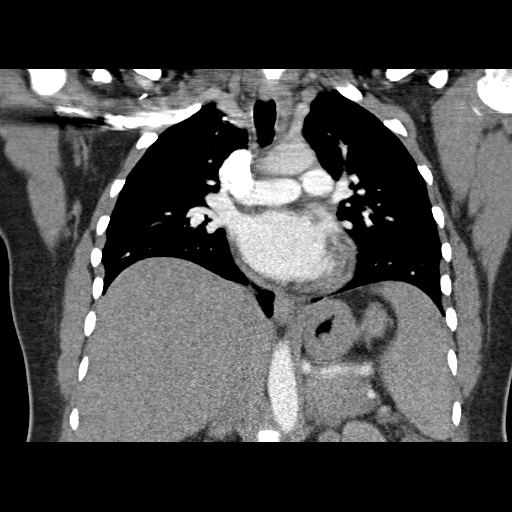
[im 80/107  soft-tissue]
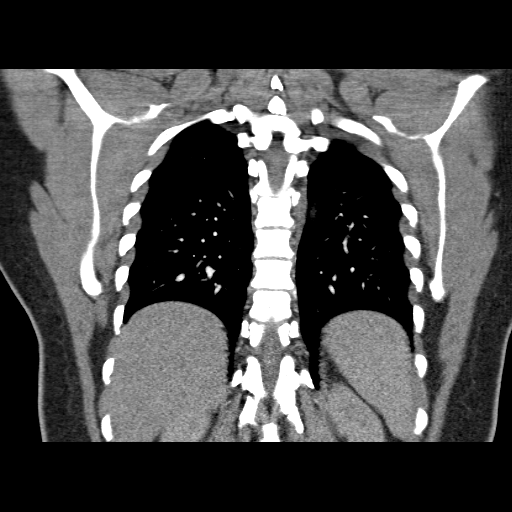

[19 of 46 positions shown; findings below may reference images not displayed]

FINDINGS: Respiratory motion degrades detailed evaluation of the
lower lobe branches.  Allowing for this, no pulmonary arterial
branch filling defect.  Normal caliber aorta.  Heart size upper
normal limits.  No pleural or pericardial effusion.  No
intrathoracic lymphadenopathy.

Limited images through the upper abdomen show no acute abnormality.
Status post cholecystectomy.

Respiratory motion degrades detailed evaluation of the lung bases.
Mild mosaic attenuation at the bases may reflect atelectasis and/or
air trapping.  No focal consolidation.} no pneumothorax.  Central
airways are patent.
IMPRESSION: Respiratory motion degrades detailed evaluation of the lower lobe
branches.  Allowing for this, no pulmonary embolism identified.

Mild mosaic attenuation at the bases may reflect atelectasis and/or
air trapping.  Otherwise, no acute intrathoracic process
identified.

## 2013-05-18 IMAGING — US US OB NUCHAL TRANSLUCENCY 1ST GEST
1 series · 13 of 28 positions shown · non-contrast
Comparison: none

[Series 1: us ob nuchal translucency 1st gest · 0.18mm/px · 13 of 46 slices shown]
[im 2/46]
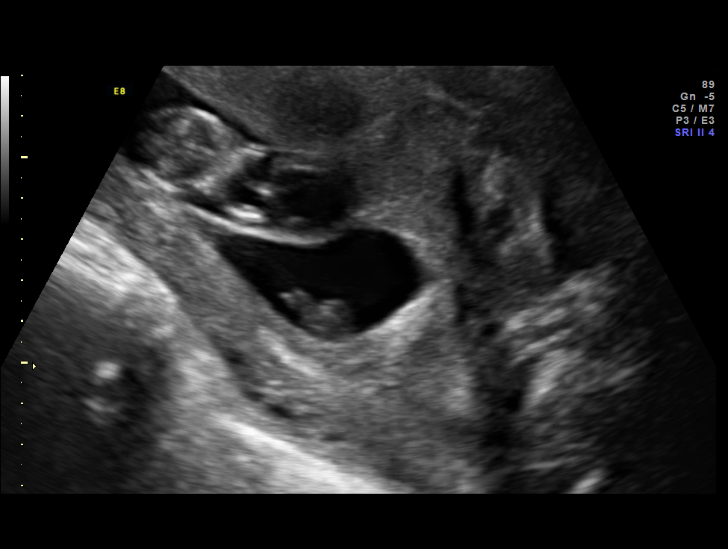
[im 6/46]
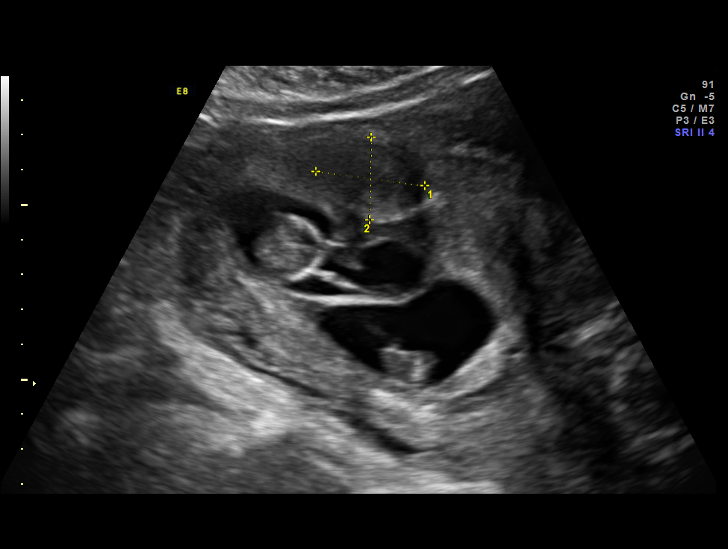
[im 9/46]
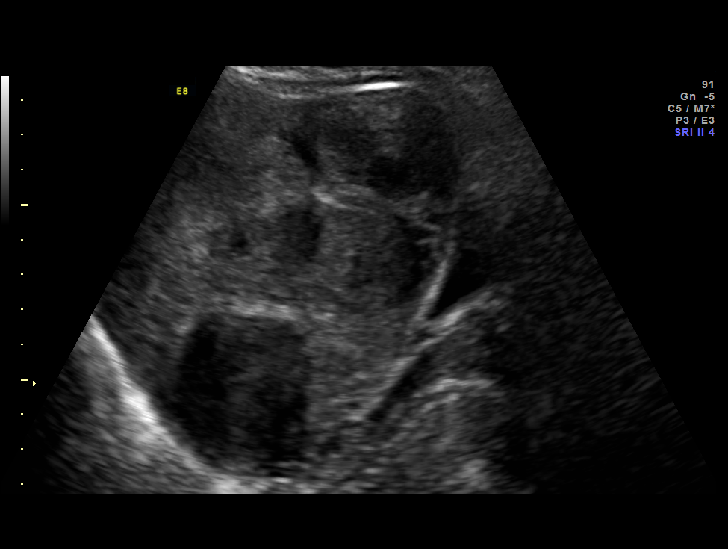
[im 12/46]
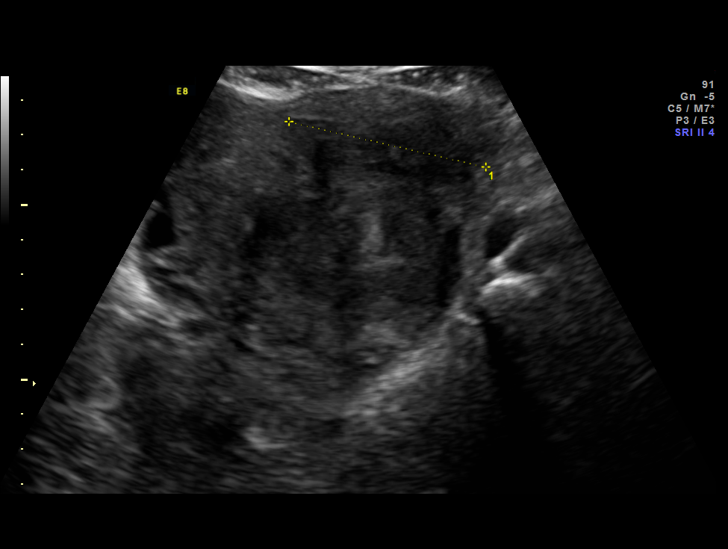
[im 16/46]
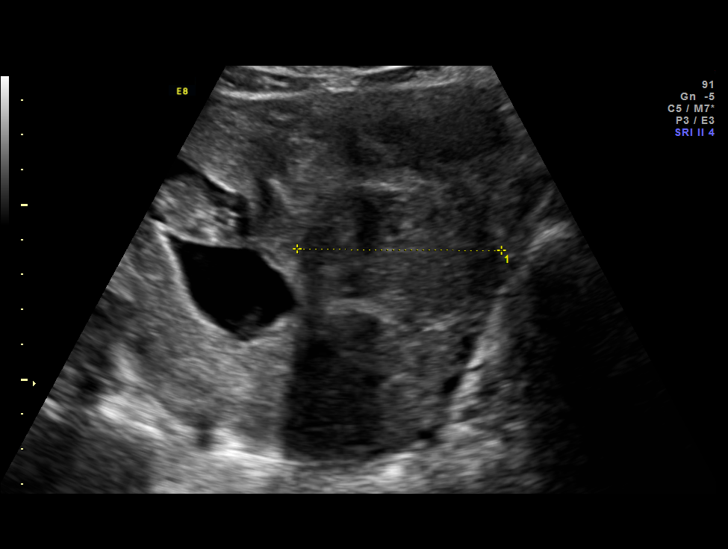
[im 19/46]
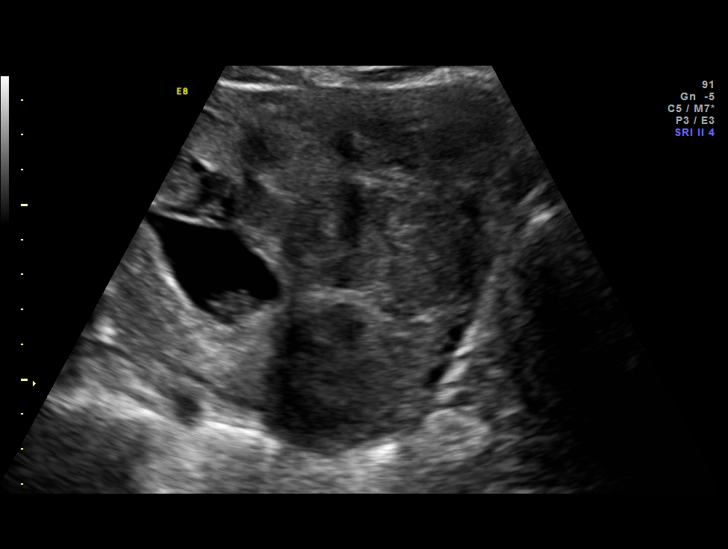
[im 24/46]
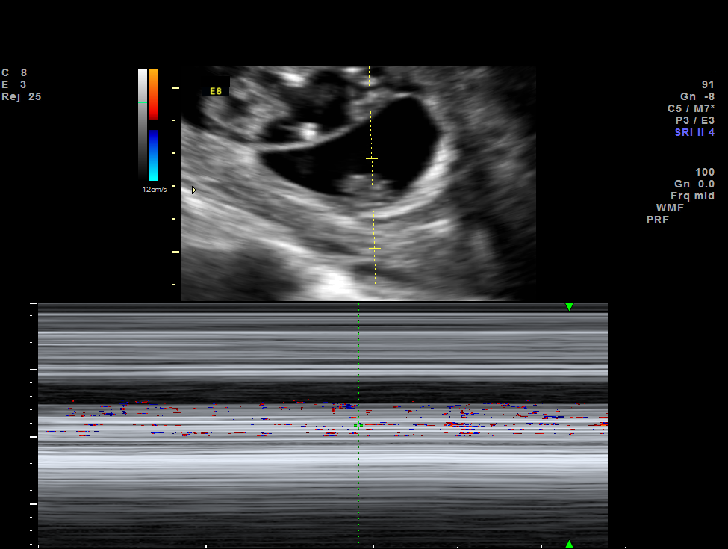
[im 27/46]
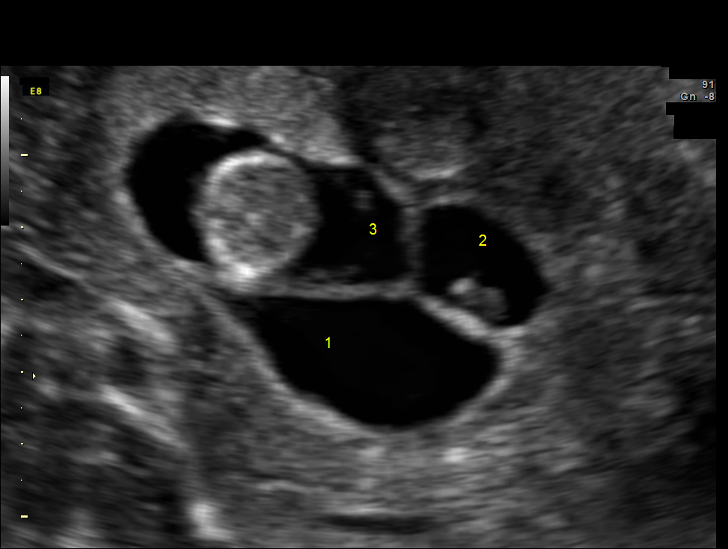
[im 31/46]
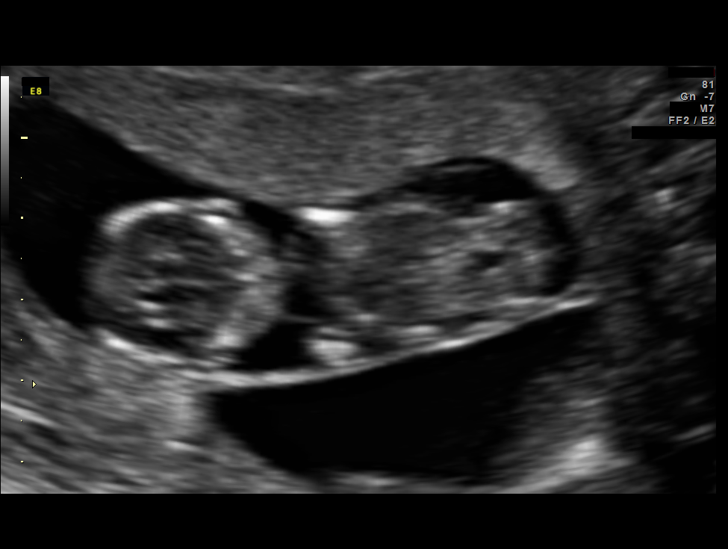
[im 34/46]
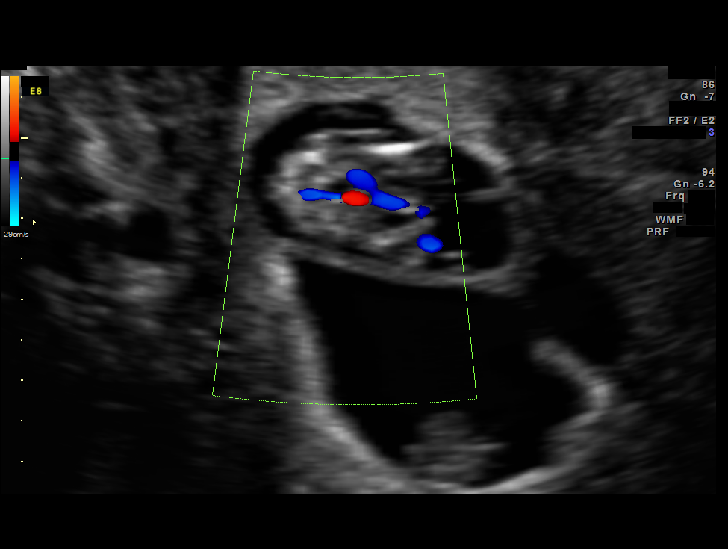
[im 37/46]
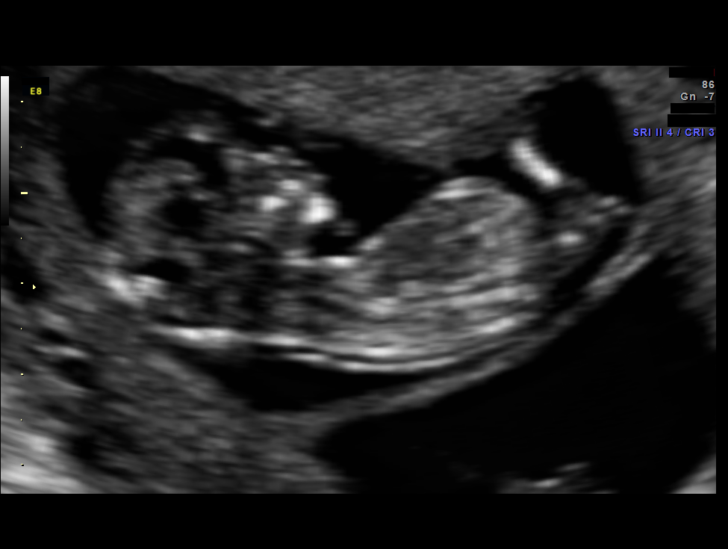
[im 41/46]
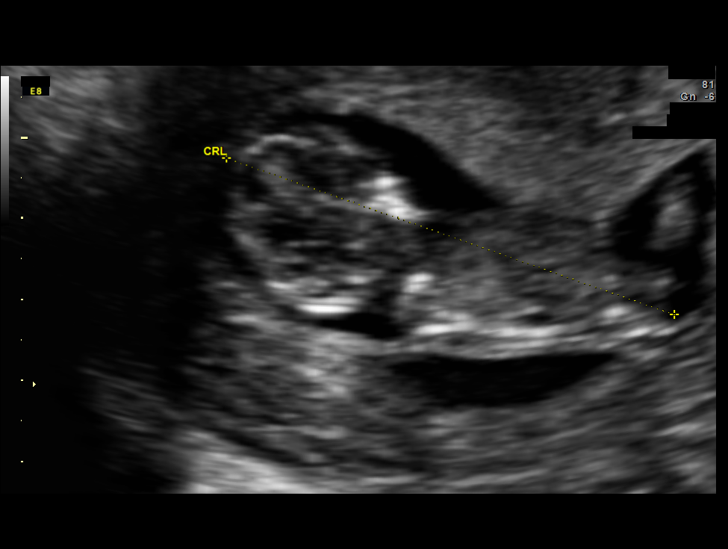
[im 44/46]
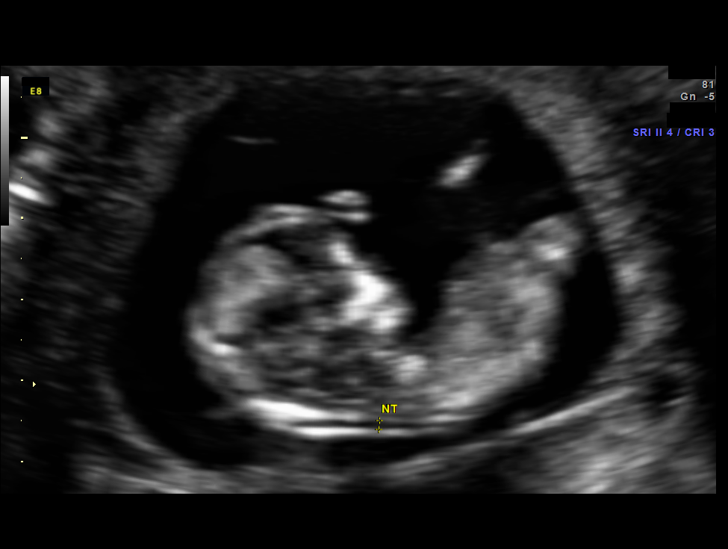

[13 of 28 positions shown; findings below may reference images not displayed]

OBSTETRICS REPORT
                      (Signed Final 06/09/2012 [DATE])

Service(s) Provided

 US FETAL NUCHAL TRANSLUCENCY                          76813.0
 MEASUREMENT
Indications

 First trimester aneuploidy screen (NT)
 Advanced maternal age (AMA), Multigravida
 Diabetes - Pregestational, Class B
 Hypertension - Chronic/Pre-existing
 Uterine fibroids
 Triplet gestation demise of 2
Fetal Evaluation

 Num Of Fetuses:    1
 Preg. Location:    Intrauterine
 Fetal Heart Rate:  165                          bpm
 Cardiac Activity:  Observed
Biometry

 CRL:     58.7  mm     G. Age:  12w 2d                 EDD:    12/20/12

 NT:       1.1  mm
Gestational Age

 Best:          12w 2d     Det. By:  Early Ultrasound         EDD:   12/20/12
                                     (05/18/12)
Cervix Uterus Adnexa

 Cervix:       Normal appearance by transabdominal scan.
 Left Ovary:    Not visualized. No adnexal mass visualized.
 Right Ovary:   Not visualized. No adnexal mass visualized.
Myomas

 Site                     L(cm)      W(cm)       D(cm)      Location
 Anterior
 Fundus
 Left
 LUS
 Blood Flow                  RI       PI       Comments

Impression

 Tri/Tri triplet gestation with best dates of 12 [DATE] weeks - s/p
 demise of baby A and B
 Baby C - CRL consistent with gestationa age
 Multiple uterine fibroids noted as above
 NT of 1.1 mm noted.  A nasal bone was visualized

 After counseling, the patient elected to undergo cell free fetal
 DNA testing ([facility])
Recommendations

 Recommend follow up in 6 weeks for detailed anatomy
 ultrasound.
 Fetal echo in 8 weeks (scheduled) due to history of diabetes.

## 2013-11-19 ENCOUNTER — Encounter (HOSPITAL_COMMUNITY): Payer: Self-pay | Admitting: Emergency Medicine

## 2013-11-19 ENCOUNTER — Emergency Department (INDEPENDENT_AMBULATORY_CARE_PROVIDER_SITE_OTHER)
Admission: EM | Admit: 2013-11-19 | Discharge: 2013-11-19 | Disposition: A | Discharge status: Home / Self Care | Payer: Medicaid Other | Source: Home / Self Care | Attending: Family Medicine | Admitting: Family Medicine

## 2013-11-19 DIAGNOSIS — G5600 Carpal tunnel syndrome, unspecified upper limb: Secondary | ICD-10-CM

## 2013-11-19 DIAGNOSIS — G5601 Carpal tunnel syndrome, right upper limb: Secondary | ICD-10-CM

## 2013-11-19 MED ORDER — DICLOFENAC SODIUM 1 % TD GEL: 4.0000 g | Freq: Four times a day (QID) | TRANSDERMAL | Status: DC

## 2013-11-19 NOTE — ED Provider Notes (Signed)
CSN: [id number]     Arrival date & time 11/19/13  1016 History   First MD Initiated Contact with Patient 11/19/13 1110     Chief Complaint  Patient presents with  . Extremity Pain   (Consider location/radiation/quality/duration/timing/severity/associated sxs/prior Treatment) Patient is a 43 y.o. female presenting with extremity pain. The history is provided by the patient.  Extremity Pain This is a new problem. Episode onset: 3 weeks of sx. The problem has been gradually worsening.    Past Medical History  Diagnosis Date  . Diabetes mellitus   . Hypertension   . Bronchitis   . Arthritis   . GERD (gastroesophageal reflux disease)   . Asthma   . Migraines 02/21/2012  . Anemia 02/21/2012  . HTN (hypertension) 02/21/2012  . Diabetes mellitus 02/21/2012  . Abnormal Pap smear   . Fibroid    Past Surgical History  Procedure Laterality Date  . Cholecystectomy  09/27/2001  . Cesarean section N/A 10/29/2012    Procedure: CESAREAN SECTION;  Surgeon: Florian Buff, MD;  Location: [facility];  Service: Obstetrics;  Laterality: N/A;   Family History  Problem Relation Age of Onset  . Coronary artery disease Father   . Cancer Paternal Uncle     throat  . Diabetes Maternal Grandmother   . Cancer Maternal Grandfather     prostate  . Cancer Paternal Grandfather     lung   History  Substance Use Topics  . Smoking status: Never Smoker   . Smokeless tobacco: Never Used  . Alcohol Use: No   OB History   Grav Para Term Preterm Abortions TAB SAB Ect Mult Living   3 3 2 1      3      Review of Systems  Constitutional: Negative.   Musculoskeletal: Positive for joint swelling and myalgias.  Skin: Negative.     Allergies  Penicillins; Pork-derived products; and Shrimp  Home Medications   Current Outpatient Rx  Name  Route  Sig  Dispense  Refill  . ACCU-CHEK FASTCLIX LANCETS MISC   Percutaneous   4 Units by Percutaneous route 4 (four) times daily.   100 each   12   . albuterol  (PROVENTIL HFA;VENTOLIN HFA) 108 (90 BASE) MCG/ACT inhaler   Inhalation   Inhale 2 puffs into the lungs every 4 (four) hours as needed for wheezing or shortness of breath. For wheezing         . diclofenac sodium (VOLTAREN) 1 % GEL   Topical   Apply 4 g topically 4 (four) times daily. To wrist   1 Tube   1   . docusate sodium (COLACE) 100 MG capsule   Oral   Take 1 capsule (100 mg total) by mouth 2 (two) times daily as needed for constipation.   30 capsule   2   . EPINEPHrine (EPIPEN) 0.3 mg/0.3 mL DEVI   Intramuscular   Inject 0.3 mLs (0.3 mg total) into the muscle as needed. Use as directed in the event of a severe allergic reaction   1 Device   3   . ferrous sulfate 325 (65 FE) MG tablet   Oral   Take 1 tablet (325 mg total) by mouth 2 (two) times daily with a meal.   60 tablet   3   . glucose blood (ACCU-CHEK SMARTVIEW) test strip      Use as instructed   100 each   12     4 per day   . ibuprofen (ADVIL,MOTRIN)  600 MG tablet   Oral   Take 1 tablet (600 mg total) by mouth every 6 (six) hours.   30 tablet   1   . lisinopril-hydrochlorothiazide (PRINZIDE,ZESTORETIC) 20-25 MG per tablet   Oral   Take 2 tablets by mouth daily.   60 tablet   3   . metFORMIN (GLUCOPHAGE) 1000 MG tablet   Oral   Take 1,000 mg by mouth 2 (two) times daily with a meal.         . metFORMIN (GLUCOPHAGE) 1000 MG tablet   Oral   Take 1 tablet (1,000 mg total) by mouth 2 (two) times daily with a meal.   60 tablet   3   . omeprazole (PRILOSEC) 20 MG capsule   Oral   Take 20 mg by mouth 2 (two) times daily.         Marland Kitchen oxyCODONE-acetaminophen (PERCOCET/ROXICET) 5-325 MG per tablet   Oral   Take 1-2 tablets by mouth every 4 (four) hours as needed.   30 tablet   0   . pramoxine-hydrocortisone (PROCTOCREAM-HC) 1-1 % rectal cream   Rectal   Place rectally 3 (three) times daily.   30 g   1    BP 192/104  Pulse 90  Temp(Src) 97.5 F (36.4 C) (Oral)  Resp 20  SpO2  99% Physical Exam  Nursing note and vitals reviewed. Constitutional: She is oriented to person, place, and time. She appears well-developed and well-nourished.  Musculoskeletal: Normal range of motion.       Right wrist: She exhibits tenderness. She exhibits normal range of motion and no swelling.       Arms: Neurological: She is alert and oriented to person, place, and time.  Skin: Skin is warm and dry.    ED Course  Procedures (including critical care time) Labs Review Labs Reviewed - No data to display Imaging Review No results found.   MDM   1. Carpal tunnel syndrome of right wrist        Billy Fischer, MD 11/19/13 1147

## 2013-11-19 NOTE — ED Notes (Signed)
Pt  Reports  Pain r  Arm  X  approx  3  Weeks        denys  Any  Injury      -  Pt  Is  A  Diabetic  Who  Has  Been  No  Compliant and  Not taking  meds

## 2013-12-16 ENCOUNTER — Ambulatory Visit: Payer: Medicaid Other | Attending: Orthopedic Surgery | Admitting: Occupational Therapy

## 2013-12-16 DIAGNOSIS — M25579 Pain in unspecified ankle and joints of unspecified foot: Secondary | ICD-10-CM | POA: Insufficient documentation

## 2013-12-16 DIAGNOSIS — IMO0001 Reserved for inherently not codable concepts without codable children: Secondary | ICD-10-CM | POA: Insufficient documentation

## 2013-12-16 DIAGNOSIS — M7710 Lateral epicondylitis, unspecified elbow: Secondary | ICD-10-CM | POA: Insufficient documentation

## 2013-12-16 DIAGNOSIS — M6281 Muscle weakness (generalized): Secondary | ICD-10-CM | POA: Diagnosis not present

## 2014-02-08 ENCOUNTER — Emergency Department (HOSPITAL_COMMUNITY)
Admission: EM | Admit: 2014-02-08 | Discharge: 2014-02-08 | Disposition: A | Discharge status: Home / Self Care | Payer: Medicaid Other | Attending: Emergency Medicine | Admitting: Emergency Medicine

## 2014-02-08 ENCOUNTER — Encounter (HOSPITAL_COMMUNITY): Payer: Self-pay | Admitting: Emergency Medicine

## 2014-02-08 ENCOUNTER — Emergency Department (HOSPITAL_COMMUNITY): Payer: Medicaid Other

## 2014-02-08 DIAGNOSIS — Z8742 Personal history of other diseases of the female genital tract: Secondary | ICD-10-CM | POA: Insufficient documentation

## 2014-02-08 DIAGNOSIS — E119 Type 2 diabetes mellitus without complications: Secondary | ICD-10-CM | POA: Insufficient documentation

## 2014-02-08 DIAGNOSIS — K219 Gastro-esophageal reflux disease without esophagitis: Secondary | ICD-10-CM | POA: Insufficient documentation

## 2014-02-08 DIAGNOSIS — I10 Essential (primary) hypertension: Secondary | ICD-10-CM | POA: Insufficient documentation

## 2014-02-08 DIAGNOSIS — Y929 Unspecified place or not applicable: Secondary | ICD-10-CM | POA: Insufficient documentation

## 2014-02-08 DIAGNOSIS — Y93K1 Activity, walking an animal: Secondary | ICD-10-CM | POA: Insufficient documentation

## 2014-02-08 DIAGNOSIS — X500XXA Overexertion from strenuous movement or load, initial encounter: Secondary | ICD-10-CM | POA: Insufficient documentation

## 2014-02-08 DIAGNOSIS — D649 Anemia, unspecified: Secondary | ICD-10-CM | POA: Insufficient documentation

## 2014-02-08 DIAGNOSIS — S8263XA Displaced fracture of lateral malleolus of unspecified fibula, initial encounter for closed fracture: Secondary | ICD-10-CM

## 2014-02-08 DIAGNOSIS — Z880 Allergy status to penicillin: Secondary | ICD-10-CM | POA: Insufficient documentation

## 2014-02-08 DIAGNOSIS — Z79899 Other long term (current) drug therapy: Secondary | ICD-10-CM | POA: Insufficient documentation

## 2014-02-08 DIAGNOSIS — J45909 Unspecified asthma, uncomplicated: Secondary | ICD-10-CM | POA: Insufficient documentation

## 2014-02-08 DIAGNOSIS — M129 Arthropathy, unspecified: Secondary | ICD-10-CM | POA: Insufficient documentation

## 2014-02-08 MED ORDER — ONDANSETRON HCL 4 MG PO TABS: 4.0000 mg | ORAL_TABLET | Freq: Four times a day (QID) | ORAL | Status: DC

## 2014-02-08 MED ORDER — OXYCODONE-ACETAMINOPHEN 5-325 MG PO TABS: 2.0000 | ORAL_TABLET | Freq: Four times a day (QID) | ORAL | Status: DC | PRN
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
## 2014-02-08 NOTE — ED Provider Notes (Signed)
Medical screening examination/treatment/procedure(s) were performed by non-physician practitioner and as supervising physician I was immediately available for consultation/collaboration.   Hoy Morn, MD 02/08/14 [phone]

## 2014-02-08 NOTE — ED Provider Notes (Signed)
CSN: [id number]     Arrival date & time 02/08/14  1342 History  This chart was scribed for non-physician practitioner Cleatrice Burke, PA-C working with Hoy Morn, MD by Eston Mould, ED Scribe. This patient was seen in room WTR6/WTR6 and the patient's care was started at 1:53 PM    Chief Complaint  Patient presents with  . Ankle Pain    left   The history is provided by the patient. No language interpreter was used.   HPI Comments: Carla Brooks is a 43 y.o. female who presents to the Emergency Department complaining of L ankle pain with associated swelling that began last night. Pt states she was walking her dog, he seen a cat, and dog ran the opposite direction she was standing in; she reports hearing a popping sound. Has had a previous injury to R ankle while in high school; states R ankle was sprain and used crutches for 4-6 weeks. States "it does not hurt" and was able to walk after incident occurred with a mild limp; was able to walk into ED without any trouble. Denies LOC, hitting head or other injuries.  Past Medical History  Diagnosis Date  . Diabetes mellitus   . Hypertension   . Bronchitis   . Arthritis   . GERD (gastroesophageal reflux disease)   . Asthma   . Migraines 02/21/2012  . Anemia 02/21/2012  . HTN (hypertension) 02/21/2012  . Diabetes mellitus 02/21/2012  . Abnormal Pap smear   . Fibroid    Past Surgical History  Procedure Laterality Date  . Cholecystectomy  09/27/2001  . Cesarean section N/A 10/29/2012    Procedure: CESAREAN SECTION;  Surgeon: Florian Buff, MD;  Location: [facility];  Service: Obstetrics;  Laterality: N/A;   Family History  Problem Relation Age of Onset  . Coronary artery disease Father   . Cancer Paternal Uncle     throat  . Diabetes Maternal Grandmother   . Cancer Maternal Grandfather     prostate  . Cancer Paternal Grandfather     lung   History  Substance Use Topics  . Smoking status: Never Smoker   . Smokeless  tobacco: Never Used  . Alcohol Use: No   OB History   Grav Para Term Preterm Abortions TAB SAB Ect Mult Living   3 3 2 1      3      Review of Systems  Musculoskeletal: Positive for joint swelling. Negative for gait problem.  Skin: Negative for color change and wound.  All other systems reviewed and are negative.  Allergies  Penicillins; Pork-derived products; and Shrimp  Home Medications   Prior to Admission medications   Medication Sig Start Date End Date Taking? Authorizing Provider  ACCU-CHEK FASTCLIX LANCETS MISC 4 Units by Percutaneous route 4 (four) times daily. 06/29/12   Seabron Spates, CNM  albuterol (PROVENTIL HFA;VENTOLIN HFA) 108 (90 BASE) MCG/ACT inhaler Inhale 2 puffs into the lungs every 4 (four) hours as needed for wheezing or shortness of breath. For wheezing    Historical Provider, MD  diclofenac sodium (VOLTAREN) 1 % GEL Apply 4 g topically 4 (four) times daily. To wrist 11/19/13   Billy Fischer, MD  docusate sodium (COLACE) 100 MG capsule Take 1 capsule (100 mg total) by mouth 2 (two) times daily as needed for constipation. 11/02/12   Martha Clan, MD  EPINEPHrine (EPIPEN) 0.3 mg/0.3 mL DEVI Inject 0.3 mLs (0.3 mg total) into the muscle as needed. Use as directed in  the event of a severe allergic reaction 03/02/12   Mirna Mires, MD  ferrous sulfate 325 (65 FE) MG tablet Take 1 tablet (325 mg total) by mouth 2 (two) times daily with a meal. 11/02/12   Martha Clan, MD  glucose blood (ACCU-CHEK SMARTVIEW) test strip Use as instructed 06/29/12   Seabron Spates, CNM  ibuprofen (ADVIL,MOTRIN) 600 MG tablet Take 1 tablet (600 mg total) by mouth every 6 (six) hours. 11/02/12   Martha Clan, MD  lisinopril-hydrochlorothiazide (PRINZIDE,ZESTORETIC) 20-25 MG per tablet Take 2 tablets by mouth daily. 11/02/12   Martha Clan, MD  metFORMIN (GLUCOPHAGE) 1000 MG tablet Take 1,000 mg by mouth 2 (two) times daily with a meal.    Historical Provider, MD  metFORMIN (GLUCOPHAGE) 1000 MG  tablet Take 1 tablet (1,000 mg total) by mouth 2 (two) times daily with a meal. 11/02/12   Martha Clan, MD  omeprazole (PRILOSEC) 20 MG capsule Take 20 mg by mouth 2 (two) times daily.    Historical Provider, MD  oxyCODONE-acetaminophen (PERCOCET/ROXICET) 5-325 MG per tablet Take 1-2 tablets by mouth every 4 (four) hours as needed. 11/02/12   Martha Clan, MD  pramoxine-hydrocortisone (PROCTOCREAM-HC) 1-1 % rectal cream Place rectally 3 (three) times daily. 10/14/12   Myrtis Ser, CNM   BP 159/98  Pulse 98  Temp(Src) 98 F (36.7 C) (Oral)  Resp 19  SpO2 99%  LMP 01/11/2014  Physical Exam  Nursing note and vitals reviewed. Constitutional: She is oriented to person, place, and time. She appears well-developed and well-nourished. No distress.  HENT:  Head: Normocephalic and atraumatic.  Right Ear: External ear normal.  Left Ear: External ear normal.  Nose: Nose normal.  Mouth/Throat: Oropharynx is clear and moist.  Eyes: Conjunctivae and EOM are normal.  Neck: Normal range of motion. Neck supple. No tracheal deviation present.  Cardiovascular: Normal rate, regular rhythm and normal heart sounds.   Pulmonary/Chest: Effort normal and breath sounds normal. No stridor. No respiratory distress. She has no wheezes. She has no rales.  Abdominal: Soft. She exhibits no distension.  Musculoskeletal: Normal range of motion.  Significant amount of swelling to left lateral ankle. No tenderness to palpation. ROM of ankle limited due to swelling. No erythema or bruising at this time. Compartment soft. Neurovascularly intact.  Squeeze test negative.   Neurological: She is alert and oriented to person, place, and time. She has normal strength.  Skin: Skin is warm and dry. She is not diaphoretic. No erythema.  Psychiatric: She has a normal mood and affect. Her behavior is normal.    ED Course  Procedures  DIAGNOSTIC STUDIES: Oxygen Saturation is 96% on RA, normal by my interpretation.     COORDINATION OF CARE: 1:56 PM-Discussed treatment plan which includes ankle X-ray. Pain medication offered but patient declined at this time. Pt agreed to plan.   Labs Review Labs Reviewed - No data to display  Imaging Review Dg Ankle Complete Left  02/08/2014   CLINICAL DATA:  Pain post trauma  EXAM: LEFT ANKLE COMPLETE - 3+ VIEW  COMPARISON:  None.  FINDINGS: Frontal, oblique, and lateral views were obtained. There is marked swelling laterally. There is a subtle avulsion off the lateral malleolus. No other evidence of fracture. No appreciable effusion. Ankle mortise appears intact.  IMPRESSION: Rather marked swelling laterally with a subtle avulsion arising from the lateral malleolus. No other evidence of fracture. Mortise intact.   Electronically Signed   By: Lowella Grip M.D.   On: 02/08/2014  14:21     EKG Interpretation None     MDM   Final diagnoses:  Avulsion fracture of lateral malleolus    Patient presents to ED for evaluation of left ankle pain after a fall. There is significant swelling over a subtle avulsion fx of lateral malleolus. Given the amount of swelling and concern for ligamentous injury I will place patient in a posterior splint and have patient follow up with orthopedics. She is neurovascularly intact. Compartment is soft. Discussed reasons to return to ED immediately. Currently patient is refusing pain medications, but I will give her a script for home. Vital signs stable for discharge. Discussed case with Dr. Venora Maples who agrees with plan. Patient / Family / Caregiver informed of clinical course, understand medical decision-making process, and agree with plan.   I personally performed the services described in this documentation, which was scribed in my presence. The recorded information has been reviewed and is accurate.    Elwyn Lade, PA-C 02/08/14 1505

## 2014-02-08 NOTE — Discharge Instructions (Signed)
Ankle Fracture A fracture is a break in a bone. A cast or splint may be used to protect the ankle and heal the break. Sometimes, surgery is needed. HOME CARE  Use crutches as told by your doctor. It is very important that you use your crutches correctly.  Do not put weight or pressure on the injured ankle until told by your doctor.  Keep your ankle raised (elevated) when sitting or lying down.  Apply ice to the ankle:  Put ice in a plastic bag.  Place a towel between your cast and the bag.  Leave the ice on for 20 minutes, 2 3 times a day.  If you have a plaster or fiberglass cast:  Do not try to scratch under the cast with any objects.  Check the skin around the cast every day. You may put lotion on red or sore areas.  Keep your cast dry and clean.  If you have a plaster splint:  Wear the splint as told by your doctor.  You can loosen the elastic around the splint if your toes get numb, tingle, or turn cold or blue.  Do not put pressure on any part of your cast or splint. It may break. Rest your plaster splint or cast only on a pillow the first 24 hours until it is fully hardened.  Cover your cast or splint with a plastic bag during showers.  Do not lower your cast or splint into water.  Take medicine as told by your doctor.  Do not drive until your doctor says it is safe.  Follow-up with your doctor as told. It is very important that you go to your follow-up visits. GET HELP IF: The swelling and discomfort gets worse.  GET HELP RIGHT AWAY IF:   Your splint or cast breaks.  You continue to have very bad pain.  You have new pain or swelling after your splint or cast was put on.  Your skin or toes below the injured ankle:  Turn blue or gray.  Feel cold, numb, or you cannot feel them.  There is a bad smell or yellowish white fluid (pus) coming from under the splint or cast. MAKE SURE YOU:   Understand these instructions.  Will watch your  condition.  Will get help right away if you are not doing well or get worse. Document Released: 06/16/2009 Document Revised: 06/09/2013 Document Reviewed: 03/18/2013 [facility] Patient Information 2014 [place], [place].  Cast or Splint Care Casts and splints support injured limbs and keep bones from moving while they heal.  HOME CARE  Keep the cast or splint uncovered during the drying period.  A plaster cast can take 24 to 48 hours to dry.  A fiberglass cast will dry in less than 1 hour.  Do not rest the cast on anything harder than a pillow for 24 hours.  Do not put weight on your injured limb. Do not put pressure on the cast. Wait for your doctor's approval.  Keep the cast or splint dry.  Cover the cast or splint with a plastic bag during baths or wet weather.  If you have a cast over your chest and belly (trunk), take sponge baths until the cast is taken off.  If your cast gets wet, dry it with a towel or blow dryer. Use the cool setting on the blow dryer.  Keep your cast or splint clean. Wash a dirty cast with a damp cloth.  Do not put any objects under your cast or  splint.  Do not scratch the skin under the cast with an object. If itching is a problem, use a blow dryer on a cool setting over the itchy area.  Do not trim or cut your cast.  Do not take out the padding from inside your cast.  Exercise your joints near the cast as told by your doctor.  Raise (elevate) your injured limb on 1 or 2 pillows for the first 1 to 3 days. GET HELP IF:  Your cast or splint cracks.  Your cast or splint is too tight or too loose.  You itch badly under the cast.  Your cast gets wet or has a soft spot.  You have a bad smell coming from the cast.  You get an object stuck under the cast.  Your skin around the cast becomes red or sore.  You have new or more pain after the cast is put on. GET HELP RIGHT AWAY IF:  You have fluid leaking through the cast.  You cannot move  your fingers or toes.  Your fingers or toes turn blue or white or are cool, painful, or puffy (swollen).  You have tingling or lose feeling (numbness) around the injured area.  You have bad pain or pressure under the cast.  You have trouble breathing or have shortness of breath.  You have chest pain. Document Released: 12/19/2010 Document Revised: 04/21/2013 Document Reviewed: 02/25/2013 [facility] Patient Information 2014 [place].

## 2014-07-04 ENCOUNTER — Encounter (HOSPITAL_COMMUNITY): Payer: Self-pay | Admitting: Emergency Medicine

## 2015-08-13 ENCOUNTER — Encounter (HOSPITAL_COMMUNITY): Payer: Self-pay

## 2015-08-13 ENCOUNTER — Emergency Department (HOSPITAL_COMMUNITY)
Admission: EM | Admit: 2015-08-13 | Discharge: 2015-08-13 | Disposition: A | Discharge status: Home / Self Care | Payer: Medicaid Other | Attending: Emergency Medicine | Admitting: Emergency Medicine

## 2015-08-13 DIAGNOSIS — K219 Gastro-esophageal reflux disease without esophagitis: Secondary | ICD-10-CM | POA: Insufficient documentation

## 2015-08-13 DIAGNOSIS — Z9119 Patient's noncompliance with other medical treatment and regimen: Secondary | ICD-10-CM | POA: Insufficient documentation

## 2015-08-13 DIAGNOSIS — D849 Immunodeficiency, unspecified: Secondary | ICD-10-CM | POA: Insufficient documentation

## 2015-08-13 DIAGNOSIS — Z86018 Personal history of other benign neoplasm: Secondary | ICD-10-CM | POA: Insufficient documentation

## 2015-08-13 DIAGNOSIS — Z7984 Long term (current) use of oral hypoglycemic drugs: Secondary | ICD-10-CM | POA: Diagnosis not present

## 2015-08-13 DIAGNOSIS — D649 Anemia, unspecified: Secondary | ICD-10-CM | POA: Insufficient documentation

## 2015-08-13 DIAGNOSIS — Z791 Long term (current) use of non-steroidal anti-inflammatories (NSAID): Secondary | ICD-10-CM | POA: Insufficient documentation

## 2015-08-13 DIAGNOSIS — H66002 Acute suppurative otitis media without spontaneous rupture of ear drum, left ear: Secondary | ICD-10-CM | POA: Insufficient documentation

## 2015-08-13 DIAGNOSIS — Z880 Allergy status to penicillin: Secondary | ICD-10-CM | POA: Insufficient documentation

## 2015-08-13 DIAGNOSIS — E119 Type 2 diabetes mellitus without complications: Secondary | ICD-10-CM | POA: Diagnosis not present

## 2015-08-13 DIAGNOSIS — M1990 Unspecified osteoarthritis, unspecified site: Secondary | ICD-10-CM | POA: Insufficient documentation

## 2015-08-13 DIAGNOSIS — Z79899 Other long term (current) drug therapy: Secondary | ICD-10-CM | POA: Diagnosis not present

## 2015-08-13 DIAGNOSIS — Z7952 Long term (current) use of systemic steroids: Secondary | ICD-10-CM | POA: Insufficient documentation

## 2015-08-13 DIAGNOSIS — I10 Essential (primary) hypertension: Secondary | ICD-10-CM | POA: Diagnosis present

## 2015-08-13 DIAGNOSIS — J45909 Unspecified asthma, uncomplicated: Secondary | ICD-10-CM | POA: Insufficient documentation

## 2015-08-13 DIAGNOSIS — G43909 Migraine, unspecified, not intractable, without status migrainosus: Secondary | ICD-10-CM | POA: Insufficient documentation

## 2015-08-13 DIAGNOSIS — H938X2 Other specified disorders of left ear: Secondary | ICD-10-CM

## 2015-08-13 MED ORDER — AZITHROMYCIN 250 MG PO TABS: ORAL_TABLET | ORAL | Status: DC

## 2015-08-13 NOTE — ED Notes (Addendum)
BIB EMS. Originally called [facility] for L ear pain x3 days. States "feels like fluid." Upon arrival pt was hypertensive. [facility] called EMS. Pt has hx of HTN and diabetes and takes no meds. Also complaining of lower back pain x8 days.  EMS vitals BP 210/126 HR100 RR16.

## 2015-08-13 NOTE — ED Notes (Signed)
INITIAL ASSESSMENT COMPLETED. PT C/O LEFT EAR ACHE X3 DAYS. PT ALSO STATES SHE HAS BEEN OFF OF HER BP MEDICATION X1 YEAR, AND WHEN THE EMS CHECKED IT TODAY, IT WAS IN THE 200'S. DENIES HEADACHE OR BLURRED VISION. AWAITING FURTHER ORDERS.

## 2015-08-13 NOTE — ED Provider Notes (Signed)
CSN: [id number]     Arrival date & time 08/13/15  1921 History   First MD Initiated Contact with Patient 08/13/15 2151     Chief Complaint  Patient presents with  . Hypertension     (Consider location/radiation/quality/duration/timing/severity/associated sxs/prior Treatment) HPI Comments: Carla Brooks is a 44 y.o. female with a PMHx of DM2, HTN, noncompliance with medications, GERD, asthma, migraines, anemia, and uterine fibroids, who presents to the ED with complaints of left ear fullness 3 days. Patient denies any pain, but states that her ear feels full and that she has somewhat diminished hearing in that ear. Patient states she called EMS with complaint, and her blood pressure was noted to be 210/126. Patient states she has not been on any blood pressure medication over a year and a half. Upon arrival her blood pressure was 157/101. Patient denies any recent travel, sick contacts, swimming, loud noise exposure, tinnitus, lightheadedness, vision changes, headache, ear drainage, fevers, chills, rhinorrhea, sore throat, eye pain or drainage, chest pain, shortness breath, cough, leg swelling, abdominal pain, nausea, vomiting, diarrhea, constipation, dysuria, hematuria, numbness, tingling, or weakness. She recently got her medicaid back and is waiting on the card, and has a PCP but can't schedule the appointment with them until she gets her medicaid card.  She has allergies to PCN, can't recall the reaction, listed as hives in the chart but pt can't recall how severe it was.  Patient is a 44 y.o. female presenting with hypertension and plugged ear sensation. The history is provided by the patient. No language interpreter was used.  Hypertension This is a chronic problem. Pertinent negatives include no abdominal pain, arthralgias, chest pain, chills, coughing, fever, headaches, myalgias, nausea, numbness, sore throat, visual change, vomiting or weakness.  Ear Fullness This is a new problem. The  current episode started in the past 7 days. The problem occurs constantly. The problem has been unchanged. Pertinent negatives include no abdominal pain, arthralgias, chest pain, chills, coughing, fever, headaches, myalgias, nausea, numbness, sore throat, visual change, vomiting or weakness. Nothing aggravates the symptoms. She has tried nothing for the symptoms. The treatment provided no relief.    Past Medical History  Diagnosis Date  . Diabetes mellitus   . Hypertension   . Bronchitis   . Arthritis   . GERD (gastroesophageal reflux disease)   . Asthma   . Migraines 02/21/2012  . Anemia 02/21/2012  . HTN (hypertension) 02/21/2012  . Diabetes mellitus ([place]) 02/21/2012  . Abnormal Pap smear   . Fibroid    Past Surgical History  Procedure Laterality Date  . Cholecystectomy  09/27/2001  . Cesarean section N/A 10/29/2012    Procedure: CESAREAN SECTION;  Surgeon: Florian Buff, MD;  Location: [facility];  Service: Obstetrics;  Laterality: N/A;   Family History  Problem Relation Age of Onset  . Coronary artery disease Father   . Cancer Paternal Uncle     throat  . Diabetes Maternal Grandmother   . Cancer Maternal Grandfather     prostate  . Cancer Paternal Grandfather     lung   Social History  Substance Use Topics  . Smoking status: Never Smoker   . Smokeless tobacco: Never Used  . Alcohol Use: No   OB History    Gravida Para Term Preterm AB TAB SAB Ectopic Multiple Living   3 3 2 1      3      Review of Systems  Constitutional: Negative for fever and chills.  HENT: Negative for ear  discharge, ear pain, rhinorrhea and sore throat.        +ear fullness  Eyes: Negative for pain and visual disturbance.  Respiratory: Negative for cough and shortness of breath.   Cardiovascular: Negative for chest pain and leg swelling.  Gastrointestinal: Negative for nausea, vomiting, abdominal pain, diarrhea and constipation.  Genitourinary: Negative for dysuria and hematuria.  Musculoskeletal:  Negative for myalgias and arthralgias.  Skin: Negative for color change.  Allergic/Immunologic: Positive for immunocompromised state (diabetic).  Neurological: Negative for dizziness, syncope, weakness, light-headedness, numbness and headaches.  Psychiatric/Behavioral: Negative for confusion.   10 Systems reviewed and are negative for acute change except as noted in the HPI.    Allergies  Penicillins; Pork-derived products; and Shrimp  Home Medications   Prior to Admission medications   Medication Sig Start Date End Date Taking? Authorizing Provider  ACCU-CHEK FASTCLIX LANCETS MISC 4 Units by Percutaneous route 4 (four) times daily. 06/29/12   Seabron Spates, CNM  albuterol (PROVENTIL HFA;VENTOLIN HFA) 108 (90 BASE) MCG/ACT inhaler Inhale 2 puffs into the lungs every 4 (four) hours as needed for wheezing or shortness of breath. For wheezing    Historical Provider, MD  diclofenac sodium (VOLTAREN) 1 % GEL Apply 4 g topically 4 (four) times daily. To wrist 11/19/13   Billy Fischer, MD  docusate sodium (COLACE) 100 MG capsule Take 1 capsule (100 mg total) by mouth 2 (two) times daily as needed for constipation. 11/02/12   Martha Clan, MD  EPINEPHrine (EPIPEN) 0.3 mg/0.3 mL DEVI Inject 0.3 mLs (0.3 mg total) into the muscle as needed. Use as directed in the event of a severe allergic reaction 03/02/12   Lajean Saver, MD  ferrous sulfate 325 (65 FE) MG tablet Take 1 tablet (325 mg total) by mouth 2 (two) times daily with a meal. 11/02/12   Martha Clan, MD  glucose blood (ACCU-CHEK SMARTVIEW) test strip Use as instructed 06/29/12   Seabron Spates, CNM  ibuprofen (ADVIL,MOTRIN) 600 MG tablet Take 1 tablet (600 mg total) by mouth every 6 (six) hours. 11/02/12   Martha Clan, MD  lisinopril-hydrochlorothiazide (PRINZIDE,ZESTORETIC) 20-25 MG per tablet Take 2 tablets by mouth daily. 11/02/12   Martha Clan, MD  metFORMIN (GLUCOPHAGE) 1000 MG tablet Take 1,000 mg by mouth 2 (two) times daily with a meal.     Historical Provider, MD  metFORMIN (GLUCOPHAGE) 1000 MG tablet Take 1 tablet (1,000 mg total) by mouth 2 (two) times daily with a meal. 11/02/12   Martha Clan, MD  omeprazole (PRILOSEC) 20 MG capsule Take 20 mg by mouth 2 (two) times daily.    Historical Provider, MD  ondansetron (ZOFRAN) 4 MG tablet Take 1 tablet (4 mg total) by mouth every 6 (six) hours. 02/08/14   Cleatrice Burke, PA-C  oxyCODONE-acetaminophen (PERCOCET/ROXICET) 5-325 MG per tablet Take 1-2 tablets by mouth every 4 (four) hours as needed. 11/02/12   Martha Clan, MD  oxyCODONE-acetaminophen (PERCOCET/ROXICET) 5-325 MG per tablet Take 2 tablets by mouth every 6 (six) hours as needed for moderate pain or severe pain. 02/08/14   Cleatrice Burke, PA-C  pramoxine-hydrocortisone (PROCTOCREAM-HC) 1-1 % rectal cream Place rectally 3 (three) times daily. 10/14/12   Myrtis Ser, CNM   BP 157/101 mmHg  Pulse 100  Temp(Src) 97.6 F (36.4 C) (Oral)  Resp 20  Ht 5\' 9"  (1.753 m)  Wt 102.059 kg  BMI 33.21 kg/m2  SpO2 100% Physical Exam  Constitutional: She is oriented to person, place, and time. Vital signs are  normal. She appears well-developed and well-nourished.  Non-toxic appearance. No distress.  Afebrile, nontoxic, NAD, BP 157/101  HENT:  Head: Normocephalic and atraumatic.  Right Ear: Hearing, tympanic membrane, external ear and ear canal normal.  Left Ear: Hearing, external ear and ear canal normal. Tympanic membrane is erythematous and bulging. A middle ear effusion is present.  Nose: Nose normal.  Mouth/Throat: Uvula is midline, oropharynx is clear and moist and mucous membranes are normal. No trismus in the jaw. No uvula swelling.  L ear with erythematous bulging TM with effusion, canal clear, no mastoid tenderness, no pain with pinna retraction. R ear clear. Nose clear. Oropharynx clear and moist, without uvular swelling or deviation, no trismus or drooling, no tonsillar swelling or erythema, no exudates.    Eyes: Conjunctivae  and EOM are normal. Pupils are equal, round, and reactive to light. Right eye exhibits no discharge. Left eye exhibits no discharge.  Neck: Normal range of motion. Neck supple.  Cardiovascular: Normal rate, regular rhythm, normal heart sounds and intact distal pulses.  Exam reveals no gallop and no friction rub.   No murmur heard. Pulmonary/Chest: Effort normal and breath sounds normal. No respiratory distress. She has no decreased breath sounds. She has no wheezes. She has no rhonchi. She has no rales.  Abdominal: Soft. Normal appearance and bowel sounds are normal. She exhibits no distension. There is no tenderness. There is no rigidity, no rebound and no guarding.  Musculoskeletal: Normal range of motion.  MAE x4 Strength and sensation grossly intact Distal pulses intact No pedal edema Gait steady  Neurological: She is alert and oriented to person, place, and time. She has normal strength. No sensory deficit.  No focal neuro deficits noted  Skin: Skin is warm, dry and intact. No rash noted.  Psychiatric: She has a normal mood and affect.  Nursing note and vitals reviewed.   ED Course  Procedures (including critical care time) Labs Review Labs Reviewed - No data to display  Imaging Review No results found. I have personally reviewed and evaluated these images and lab results as part of my medical decision-making.   EKG Interpretation None      MDM   Final diagnoses:  Acute suppurative otitis media of left ear without spontaneous rupture of tympanic membrane, recurrence not specified  Essential hypertension  Ear pressure, left    44 y.o. female here with L ear fullness and slightly diminished hearing x3 days. Called EMS for this and her BP was noted to be in the 123456 systolic. Upon arrival, BP 157/101. Pt hasn't been on BP meds for over 39yr. Currently asymptomatic, no exam findings concerning for HTN urgency, and BP not severely deranged here. Doubt need for further work up  for this. As for her ear, TM erythematous and bulging with effusion, will treat for AOM, will use azithromycin since pt is allergic to PCN and can't recall the severity of allergy. Discussed f/up with PCP in 1wk, who will likely restart her BP meds. Discussed DASH diet. I explained the diagnosis and have given explicit precautions to return to the ER including for any other new or worsening symptoms. The patient understands and accepts the medical plan as it's been dictated and I have answered their questions. Discharge instructions concerning home care and prescriptions have been given. The patient is STABLE and is discharged to home in good condition.   BP 157/101 mmHg  Pulse 100  Temp(Src) 97.6 F (36.4 C) (Oral)  Resp 20  Ht 5\' 9"  (  1.753 m)  Wt 102.059 kg  BMI 33.21 kg/m2  SpO2 100%  Meds ordered this encounter  Medications  . azithromycin (ZITHROMAX Z-PAK) 250 MG tablet    Sig: 2 po day one, then 1 daily x 4 days    Dispense:  5 tablet    Refill:  0    Order Specific Question:  Supervising Provider    Answer:  Barnett Hatter, PA-C 08/13/15 2219  Sharlett Iles, MD 08/14/15 [phone]

## 2015-08-13 NOTE — ED Notes (Signed)
PT DISCHARGED. INSTRUCTIONS AND PRESCRIPTION GIVEN. AAOX3. PT IN NO APPARENT DISTRESS OR PAIN. THE OPPORTUNITY TO ASK QUESTIONS WAS PROVIDED. 

## 2015-08-13 NOTE — Discharge Instructions (Signed)
You have an ear infection, take the antibiotics as directed. Use tylenol or motrin as needed for pain. Your blood pressure was noted to be elevated, you will need to follow up with your regular doctor for ongoing management of this. You should minimize the salt in your diet. Follow up with the doctor on your medicaid card in 1 week for recheck and ongoing management of your blood pressure. Return to the ER for changes or worsening symptoms.   Otitis Media, Adult Otitis media is redness, soreness, and inflammation of the middle ear. Otitis media may be caused by allergies or, most commonly, by infection. Often it occurs as a complication of the common cold. SIGNS AND SYMPTOMS Symptoms of otitis media may include:  Earache.  Fever.  Ringing in your ear.  Headache.  Leakage of fluid from the ear. DIAGNOSIS To diagnose otitis media, your health care provider will examine your ear with an otoscope. This is an instrument that allows your health care provider to see into your ear in order to examine your eardrum. Your health care provider also will ask you questions about your symptoms. TREATMENT  Typically, otitis media resolves on its own within 3-5 days. Your health care provider may prescribe medicine to ease your symptoms of pain. If otitis media does not resolve within 5 days or is recurrent, your health care provider may prescribe antibiotic medicines if he or she suspects that a bacterial infection is the cause. HOME CARE INSTRUCTIONS   If you were prescribed an antibiotic medicine, finish it all even if you start to feel better.  Take medicines only as directed by your health care provider.  Keep all follow-up visits as directed by your health care provider. SEEK MEDICAL CARE IF:  You have otitis media only in one ear, or bleeding from your nose, or both.  You notice a lump on your neck.  You are not getting better in 3-5 days.  You feel worse instead of better. SEEK IMMEDIATE  MEDICAL CARE IF:   You have pain that is not controlled with medicine.  You have swelling, redness, or pain around your ear or stiffness in your neck.  You notice that part of your face is paralyzed.  You notice that the bone behind your ear (mastoid) is tender when you touch it. MAKE SURE YOU:   Understand these instructions.  Will watch your condition.  Will get help right away if you are not doing well or get worse.   This information is not intended to replace advice given to you by your health care provider. Make sure you discuss any questions you have with your health care provider.   Document Released: 05/24/2004 Document Revised: 09/09/2014 Document Reviewed: 03/16/2013 Elsevier Interactive Patient Education 2016 [employer].  Hypertension Hypertension, commonly called high blood pressure, is when the force of blood pumping through your arteries is too strong. Your arteries are the blood vessels that carry blood from your heart throughout your body. A blood pressure reading consists of a higher number over a lower number, such as 110/72. The higher number (systolic) is the pressure inside your arteries when your heart pumps. The lower number (diastolic) is the pressure inside your arteries when your heart relaxes. Ideally you want your blood pressure below 120/80. Hypertension forces your heart to work harder to pump blood. Your arteries may become narrow or stiff. Having untreated or uncontrolled hypertension can cause heart attack, stroke, kidney disease, and other problems. RISK FACTORS Some risk factors for  high blood pressure are controllable. Others are not.  Risk factors you cannot control include:   Race. You may be at higher risk if you are African American.  Age. Risk increases with age.  Gender. Men are at higher risk than women before age 32 years. After age 34, women are at higher risk than men. Risk factors you can control include:  Not getting enough  exercise or physical activity.  Being overweight.  Getting too much fat, sugar, calories, or salt in your diet.  Drinking too much alcohol. SIGNS AND SYMPTOMS Hypertension does not usually cause signs or symptoms. Extremely high blood pressure (hypertensive crisis) may cause headache, anxiety, shortness of breath, and nosebleed. DIAGNOSIS To check if you have hypertension, your health care provider will measure your blood pressure while you are seated, with your arm held at the level of your heart. It should be measured at least twice using the same arm. Certain conditions can cause a difference in blood pressure between your right and left arms. A blood pressure reading that is higher than normal on one occasion does not mean that you need treatment. If it is not clear whether you have high blood pressure, you may be asked to return on a different day to have your blood pressure checked again. Or, you may be asked to monitor your blood pressure at home for 1 or more weeks. TREATMENT Treating high blood pressure includes making lifestyle changes and possibly taking medicine. Living a healthy lifestyle can help lower high blood pressure. You may need to change some of your habits. Lifestyle changes may include:  Following the DASH diet. This diet is high in fruits, vegetables, and whole grains. It is low in salt, red meat, and added sugars.  Keep your sodium intake below 2,300 mg per day.  Getting at least 30-45 minutes of aerobic exercise at least 4 times per week.  Losing weight if necessary.  Not smoking.  Limiting alcoholic beverages.  Learning ways to reduce stress. Your health care provider may prescribe medicine if lifestyle changes are not enough to get your blood pressure under control, and if one of the following is true:  You are 64-46 years of age and your systolic blood pressure is above 140.  You are 74 years of age or older, and your systolic blood pressure is above  150.  Your diastolic blood pressure is above 90.  You have diabetes, and your systolic blood pressure is over [id number] or your diastolic blood pressure is over 90.  You have kidney disease and your blood pressure is above 140/90.  You have heart disease and your blood pressure is above 140/90. Your personal target blood pressure may vary depending on your medical conditions, your age, and other factors. HOME CARE INSTRUCTIONS  Have your blood pressure rechecked as directed by your health care provider.   Take medicines only as directed by your health care provider. Follow the directions carefully. Blood pressure medicines must be taken as prescribed. The medicine does not work as well when you skip doses. Skipping doses also puts you at risk for problems.  Do not smoke.   Monitor your blood pressure at home as directed by your health care provider. SEEK MEDICAL CARE IF:   You think you are having a reaction to medicines taken.  You have recurrent headaches or feel dizzy.  You have swelling in your ankles.  You have trouble with your vision. SEEK IMMEDIATE MEDICAL CARE IF:  You develop a severe  headache or confusion.  You have unusual weakness, numbness, or feel faint.  You have severe chest or abdominal pain.  You vomit repeatedly.  You have trouble breathing. MAKE SURE YOU:   Understand these instructions.  Will watch your condition.  Will get help right away if you are not doing well or get worse.   This information is not intended to replace advice given to you by your health care provider. Make sure you discuss any questions you have with your health care provider.   Document Released: 08/19/2005 Document Revised: 01/03/2015 Document Reviewed: 06/11/2013 Elsevier Interactive Patient Education 2016 [place] Your High Blood Pressure Blood pressure is a measurement of how forceful your blood is pressing against the walls of the arteries. Arteries  are muscular tubes within the circulatory system. Blood pressure does not stay the same. Blood pressure rises when you are active, excited, or nervous; and it lowers during sleep and relaxation. If the numbers measuring your blood pressure stay above normal most of the time, you are at risk for health problems. High blood pressure (hypertension) is a long-term (chronic) condition in which blood pressure is elevated. A blood pressure reading is recorded as two numbers, such as 120 over 80 (or 120/80). The first, higher number is called the systolic pressure. It is a measure of the pressure in your arteries as the heart beats. The second, lower number is called the diastolic pressure. It is a measure of the pressure in your arteries as the heart relaxes between beats.  Keeping your blood pressure in a normal range is important to your overall health and prevention of health problems, such as heart disease and stroke. When your blood pressure is uncontrolled, your heart has to work harder than normal. High blood pressure is a very common condition in adults because blood pressure tends to rise with age. Men and women are equally likely to have hypertension but at different times in life. Before age 50, men are more likely to have hypertension. After 42 years of age, women are more likely to have it. Hypertension is especially common in African Americans. This condition often has no signs or symptoms. The cause of the condition is usually not known. Your caregiver can help you come up with a plan to keep your blood pressure in a normal, healthy range. BLOOD PRESSURE STAGES Blood pressure is classified into four stages: normal, prehypertension, stage 1, and stage 2. Your blood pressure reading will be used to determine what type of treatment, if any, is necessary. Appropriate treatment options are tied to these four stages:  Normal  Systolic pressure (mm Hg): below 120.  Diastolic pressure (mm Hg): below  80. Prehypertension  Systolic pressure (mm Hg): 120 to 139.  Diastolic pressure (mm Hg): 80 to 89. Stage1  Systolic pressure (mm Hg): 140 to 159.  Diastolic pressure (mm Hg): 90 to 99. Stage2  Systolic pressure (mm Hg): 160 or above.  Diastolic pressure (mm Hg): 100 or above. RISKS RELATED TO HIGH BLOOD PRESSURE Managing your blood pressure is an important responsibility. Uncontrolled high blood pressure can lead to:  A heart attack.  A stroke.  A weakened blood vessel (aneurysm).  Heart failure.  Kidney damage.  Eye damage.  Metabolic syndrome.  Memory and concentration problems. HOW TO MANAGE YOUR BLOOD PRESSURE Blood pressure can be managed effectively with lifestyle changes and medicines (if needed). Your caregiver will help you come up with a plan to bring your blood pressure within a  normal range. Your plan should include the following: Education  Read all information provided by your caregivers about how to control blood pressure.  Educate yourself on the latest guidelines and treatment recommendations. New research is always being done to further define the risks and treatments for high blood pressure. Lifestylechanges  Control your weight.  Avoid smoking.  Stay physically active.  Reduce the amount of salt in your diet.  Reduce stress.  Control any chronic conditions, such as high cholesterol or diabetes.  Reduce your alcohol intake. Medicines  Several medicines (antihypertensive medicines) are available, if needed, to bring blood pressure within a normal range. Communication  Review all the medicines you take with your caregiver because there may be side effects or interactions.  Talk with your caregiver about your diet, exercise habits, and other lifestyle factors that may be contributing to high blood pressure.  See your caregiver regularly. Your caregiver can help you create and adjust your plan for managing high blood  pressure. RECOMMENDATIONS FOR TREATMENT AND FOLLOW-UP  The following recommendations are based on current guidelines for managing high blood pressure in nonpregnant adults. Use these recommendations to identify the proper follow-up period or treatment option based on your blood pressure reading. You can discuss these options with your caregiver.  Systolic pressure of 123456 to [id number] or diastolic pressure of 80 to 89: Follow up with your caregiver as directed.  Systolic pressure of [id number] to 0000000 or diastolic pressure of 90 to 100: Follow up with your caregiver within 2 months.  Systolic pressure above 0000000 or diastolic pressure above [id number]: Follow up with your caregiver within 1 month.  Systolic pressure above [id number] or diastolic pressure above [id number]: Consider antihypertensive therapy; follow up with your caregiver within 1 week.  Systolic pressure above [id number] or diastolic pressure above 123456: Begin antihypertensive therapy; follow up with your caregiver within 1 week.   This information is not intended to replace advice given to you by your health care provider. Make sure you discuss any questions you have with your health care provider.   Document Released: 05/13/2012 Document Reviewed: 05/13/2012 Elsevier Interactive Patient Education [employer].  How to Take Your Blood Pressure HOW DO I GET A BLOOD PRESSURE MACHINE?  You can buy an electronic home blood pressure machine at your local pharmacy. Insurance will sometimes cover the cost if you have a prescription.  Ask your doctor what type of machine is best for you. There are different machines for your arm and your wrist.  If you decide to buy a machine to check your blood pressure on your arm, first check the size of your arm so you can buy the right size cuff. To check the size of your arm:   Use a measuring tape that shows both inches and centimeters.   Wrap the measuring tape around the upper-middle part of your arm. You may need someone  to help you measure.   Write down your arm measurement in both inches and centimeters.   To measure your blood pressure correctly, it is important to have the right size cuff.   If your arm is up to 13 inches (up to 34 centimeters), get an adult cuff size.  If your arm is 13 to 17 inches (35 to 44 centimeters), get a large adult cuff size.    If your arm is 17 to 20 inches (45 to 52 centimeters), get an adult thigh cuff.  WHAT DO THE NUMBERS MEAN?   There are two numbers  that make up your blood pressure. For example: 120/80.  The first number (120 in our example) is called the "systolic pressure." It is a measure of the pressure in your blood vessels when your heart is pumping blood.  The second number (80 in our example) is called the "diastolic pressure." It is a measure of the pressure in your blood vessels when your heart is resting between beats.  Your doctor will tell you what your blood pressure should be. WHAT SHOULD I DO BEFORE I CHECK MY BLOOD PRESSURE?   Try to rest or relax for at least 30 minutes before you check your blood pressure.  Do not smoke.  Do not have any drinks with caffeine, such as:  Soda.  Coffee.  Tea.  Check your blood pressure in a quiet room.  Sit down and stretch out your arm on a table. Keep your arm at about the level of your heart. Let your arm relax.  Make sure that your legs are not crossed. HOW DO I CHECK MY BLOOD PRESSURE?  Follow the directions that came with your machine.  Make sure you remove any tight-fitting clothing from your arm or wrist. Wrap the cuff around your upper arm or wrist. You should be able to fit a finger between the cuff and your arm. If you cannot fit a finger between the cuff and your arm, it is too tight and should be removed and rewrapped.  Some units require you to manually pump up the arm cuff.  Automatic units inflate the cuff when you press a button.  Cuff deflation is automatic in both  models.  After the cuff is inflated, the unit measures your blood pressure and pulse. The readings are shown on a monitor. Hold still and breathe normally while the cuff is inflated.  Getting a reading takes less than a minute.  Some models store readings in a memory. Some provide a printout of readings. If your machine does not store your readings, keep a written record.  Take readings with you to your next visit with your doctor.   This information is not intended to replace advice given to you by your health care provider. Make sure you discuss any questions you have with your health care provider.   Document Released: 08/01/2008 Document Revised: 09/09/2014 Document Reviewed: 10/14/2013 Elsevier Interactive Patient Education 2016 [place] DASH stands for "Dietary Approaches to Stop Hypertension." The DASH eating plan is a healthy eating plan that has been shown to reduce high blood pressure (hypertension). Additional health benefits may include reducing the risk of type 2 diabetes mellitus, heart disease, and stroke. The DASH eating plan may also help with weight loss. WHAT DO I NEED TO KNOW ABOUT THE DASH EATING PLAN? For the DASH eating plan, you will follow these general guidelines:  Choose foods with a percent daily value for sodium of less than 5% (as listed on the food label).  Use salt-free seasonings or herbs instead of table salt or sea salt.  Check with your health care provider or pharmacist before using salt substitutes.  Eat lower-sodium products, often labeled as "lower sodium" or "no salt added."  Eat fresh foods.  Eat more vegetables, fruits, and low-fat dairy products.  Choose whole grains. Look for the word "whole" as the first word in the ingredient list.  Choose fish and skinless chicken or [place] more often than red meat. Limit fish, poultry, and meat to 6 oz (170 g) each day.  Limit sweets, desserts, sugars, and sugary  drinks.  Choose heart-healthy fats.  Limit cheese to 1 oz (28 g) per day.  Eat more home-cooked food and less restaurant, buffet, and fast food.  Limit fried foods.  Cook foods using methods other than frying.  Limit canned vegetables. If you do use them, rinse them well to decrease the sodium.  When eating at a restaurant, ask that your food be prepared with less salt, or no salt if possible. WHAT FOODS CAN I EAT? Seek help from a dietitian for individual calorie needs. Grains Whole grain or whole wheat bread. Brown rice. Whole grain or whole wheat pasta. Quinoa, bulgur, and whole grain cereals. Low-sodium cereals. Corn or whole wheat flour tortillas. Whole grain cornbread. Whole grain crackers. Low-sodium crackers. Vegetables Fresh or frozen vegetables (raw, steamed, roasted, or grilled). Low-sodium or reduced-sodium tomato and vegetable juices. Low-sodium or reduced-sodium tomato sauce and paste. Low-sodium or reduced-sodium canned vegetables.  Fruits All fresh, canned (in natural juice), or frozen fruits. Meat and Other Protein Products Ground beef (85% or leaner), grass-fed beef, or beef trimmed of fat. Skinless chicken or [place]. Ground chicken or [place]. Pork trimmed of fat. All fish and seafood. Eggs. Dried beans, peas, or lentils. Unsalted nuts and seeds. Unsalted canned beans. Dairy Low-fat dairy products, such as skim or 1% milk, 2% or reduced-fat cheeses, low-fat ricotta or cottage cheese, or plain low-fat yogurt. Low-sodium or reduced-sodium cheeses. Fats and Oils Tub margarines without trans fats. Light or reduced-fat mayonnaise and salad dressings (reduced sodium). Avocado. Safflower, olive, or canola oils. Natural peanut or almond butter. Other Unsalted popcorn and pretzels. The items listed above may not be a complete list of recommended foods or beverages. Contact your dietitian for more options. WHAT FOODS ARE NOT RECOMMENDED? Grains White bread. White pasta.  White rice. Refined cornbread. Bagels and croissants. Crackers that contain trans fat. Vegetables Creamed or fried vegetables. Vegetables in a cheese sauce. Regular canned vegetables. Regular canned tomato sauce and paste. Regular tomato and vegetable juices. Fruits Dried fruits. Canned fruit in light or heavy syrup. Fruit juice. Meat and Other Protein Products Fatty cuts of meat. Ribs, chicken wings, bacon, sausage, bologna, salami, chitterlings, fatback, hot dogs, bratwurst, and packaged luncheon meats. Salted nuts and seeds. Canned beans with salt. Dairy Whole or 2% milk, cream, half-and-half, and cream cheese. Whole-fat or sweetened yogurt. Full-fat cheeses or blue cheese. Nondairy creamers and whipped toppings. Processed cheese, cheese spreads, or cheese curds. Condiments Onion and garlic salt, seasoned salt, table salt, and sea salt. Canned and packaged gravies. Worcestershire sauce. Tartar sauce. Barbecue sauce. Teriyaki sauce. Soy sauce, including reduced sodium. Steak sauce. Fish sauce. Oyster sauce. Cocktail sauce. Horseradish. Ketchup and mustard. Meat flavorings and tenderizers. Bouillon cubes. Hot sauce. Tabasco sauce. Marinades. Taco seasonings. Relishes. Fats and Oils Butter, stick margarine, lard, shortening, ghee, and bacon fat. Coconut, palm kernel, or palm oils. Regular salad dressings. Other Pickles and olives. Salted popcorn and pretzels. The items listed above may not be a complete list of foods and beverages to avoid. Contact your dietitian for more information. WHERE CAN I FIND MORE INFORMATION? National Heart, Lung, and Blood Institute: travelstabloid.com   This information is not intended to replace advice given to you by your health care provider. Make sure you discuss any questions you have with your health care provider.   Document Released: 08/08/2011 Document Revised: 09/09/2014 Document Reviewed: 06/23/2013 Elsevier Interactive  Patient Education [employer].

## 2015-09-08 ENCOUNTER — Emergency Department (HOSPITAL_COMMUNITY)
Admission: EM | Admit: 2015-09-08 | Discharge: 2015-09-08 | Disposition: A | Discharge status: Home / Self Care | Payer: Medicaid Other | Attending: Emergency Medicine | Admitting: Emergency Medicine

## 2015-09-08 ENCOUNTER — Encounter (HOSPITAL_COMMUNITY): Payer: Self-pay

## 2015-09-08 ENCOUNTER — Emergency Department (HOSPITAL_COMMUNITY): Payer: Medicaid Other

## 2015-09-08 DIAGNOSIS — S39012A Strain of muscle, fascia and tendon of lower back, initial encounter: Secondary | ICD-10-CM

## 2015-09-08 DIAGNOSIS — I10 Essential (primary) hypertension: Secondary | ICD-10-CM | POA: Insufficient documentation

## 2015-09-08 DIAGNOSIS — Z7952 Long term (current) use of systemic steroids: Secondary | ICD-10-CM | POA: Insufficient documentation

## 2015-09-08 DIAGNOSIS — Y998 Other external cause status: Secondary | ICD-10-CM | POA: Diagnosis not present

## 2015-09-08 DIAGNOSIS — Z86018 Personal history of other benign neoplasm: Secondary | ICD-10-CM | POA: Diagnosis not present

## 2015-09-08 DIAGNOSIS — W1839XA Other fall on same level, initial encounter: Secondary | ICD-10-CM | POA: Insufficient documentation

## 2015-09-08 DIAGNOSIS — E119 Type 2 diabetes mellitus without complications: Secondary | ICD-10-CM | POA: Insufficient documentation

## 2015-09-08 DIAGNOSIS — D649 Anemia, unspecified: Secondary | ICD-10-CM | POA: Insufficient documentation

## 2015-09-08 DIAGNOSIS — Z791 Long term (current) use of non-steroidal anti-inflammatories (NSAID): Secondary | ICD-10-CM | POA: Diagnosis not present

## 2015-09-08 DIAGNOSIS — Z880 Allergy status to penicillin: Secondary | ICD-10-CM | POA: Insufficient documentation

## 2015-09-08 DIAGNOSIS — K219 Gastro-esophageal reflux disease without esophagitis: Secondary | ICD-10-CM | POA: Diagnosis not present

## 2015-09-08 DIAGNOSIS — Y9389 Activity, other specified: Secondary | ICD-10-CM | POA: Insufficient documentation

## 2015-09-08 DIAGNOSIS — M1990 Unspecified osteoarthritis, unspecified site: Secondary | ICD-10-CM | POA: Diagnosis not present

## 2015-09-08 DIAGNOSIS — Z79899 Other long term (current) drug therapy: Secondary | ICD-10-CM | POA: Insufficient documentation

## 2015-09-08 DIAGNOSIS — J45909 Unspecified asthma, uncomplicated: Secondary | ICD-10-CM | POA: Diagnosis not present

## 2015-09-08 DIAGNOSIS — Y9289 Other specified places as the place of occurrence of the external cause: Secondary | ICD-10-CM | POA: Diagnosis not present

## 2015-09-08 DIAGNOSIS — S3992XA Unspecified injury of lower back, initial encounter: Secondary | ICD-10-CM | POA: Diagnosis present

## 2015-09-08 LAB — POC URINE PREG, ED: Preg Test, Ur: NEGATIVE

## 2015-09-08 MED ORDER — CYCLOBENZAPRINE HCL 10 MG PO TABS: 10.0000 mg | ORAL_TABLET | Freq: Two times a day (BID) | ORAL | Status: DC | PRN

## 2015-09-08 MED ORDER — NAPROXEN 500 MG PO TABS: 500.0000 mg | ORAL_TABLET | Freq: Two times a day (BID) | ORAL | Status: DC

## 2015-09-08 NOTE — ED Notes (Signed)
Pt presents with c/o back pain. Pt reports her back has been giving her problems for several months but she also fell 2 days ago in the shower and the pain has been worse since then. Pt is ambulatory to triage. Reports the pain runs down her hip and leg as well.

## 2015-09-08 NOTE — Discharge Instructions (Signed)
Cryotherapy °Cryotherapy means treatment with cold. Ice or gel packs can be used to reduce both pain and swelling. Ice is the most helpful within the first 24 to 48 hours after an injury or flare-up from overusing a muscle or joint. Sprains, strains, spasms, burning pain, shooting pain, and aches can all be eased with ice. Ice can also be used when recovering from surgery. Ice is effective, has very few side effects, and is safe for most people to use. °PRECAUTIONS  °Ice is not a safe treatment option for people with: °· Raynaud phenomenon. This is a condition affecting small blood vessels in the extremities. Exposure to cold may cause your problems to return. °· Cold hypersensitivity. There are many forms of cold hypersensitivity, including: °· Cold urticaria. Red, itchy hives appear on the skin when the tissues begin to warm after being iced. °· Cold erythema. This is a red, itchy rash caused by exposure to cold. °· Cold hemoglobinuria. Red blood cells break down when the tissues begin to warm after being iced. The hemoglobin that carry oxygen are passed into the urine because they cannot combine with blood proteins fast enough. °· Numbness or altered sensitivity in the area being iced. °If you have any of the following conditions, do not use ice until you have discussed cryotherapy with your caregiver: °· Heart conditions, such as arrhythmia, angina, or chronic heart disease. °· High blood pressure. °· Healing wounds or open skin in the area being iced. °· Current infections. °· Rheumatoid arthritis. °· Poor circulation. °· Diabetes. °Ice slows the blood flow in the region it is applied. This is beneficial when trying to stop inflamed tissues from spreading irritating chemicals to surrounding tissues. However, if you expose your skin to cold temperatures for too long or without the proper protection, you can damage your skin or nerves. Watch for signs of skin damage due to cold. °HOME CARE INSTRUCTIONS °Follow  these tips to use ice and cold packs safely. °· Place a dry or damp towel between the ice and skin. A damp towel will cool the skin more quickly, so you may need to shorten the time that the ice is used. °· For a more rapid response, add gentle compression to the ice. °· Ice for no more than 10 to 20 minutes at a time. The bonier the area you are icing, the less time it will take to get the benefits of ice. °· Check your skin after 5 minutes to make sure there are no signs of a poor response to cold or skin damage. °· Rest 20 minutes or more between uses. °· Once your skin is numb, you can end your treatment. You can test numbness by very lightly touching your skin. The touch should be so light that you do not see the skin dimple from the pressure of your fingertip. When using ice, most people will feel these normal sensations in this order: cold, burning, aching, and numbness. °· Do not use ice on someone who cannot communicate their responses to pain, such as small children or people with dementia. °HOW TO MAKE AN ICE PACK °Ice packs are the most common way to use ice therapy. Other methods include ice massage, ice baths, and cryosprays. Muscle creams that cause a cold, tingly feeling do not offer the same benefits that ice offers and should not be used as a substitute unless recommended by your caregiver. °To make an ice pack, do one of the following: °· Place crushed ice or a   bag of frozen vegetables in a sealable plastic bag. Squeeze out the excess air. Place this bag inside another plastic bag. Slide the bag into a pillowcase or place a damp towel between your skin and the bag.  Mix 3 parts water with 1 part rubbing alcohol. Freeze the mixture in a sealable plastic bag. When you remove the mixture from the freezer, it will be slushy. Squeeze out the excess air. Place this bag inside another plastic bag. Slide the bag into a pillowcase or place a damp towel between your skin and the bag. SEEK MEDICAL CARE  IF:  You develop white spots on your skin. This may give the skin a blotchy (mottled) appearance.  Your skin turns blue or pale.  Your skin becomes waxy or hard.  Your swelling gets worse. MAKE SURE YOU:   Understand these instructions.  Will watch your condition.  Will get help right away if you are not doing well or get worse.   This information is not intended to replace advice given to you by your health care provider. Make sure you discuss any questions you have with your health care provider.   Document Released: 04/15/2011 Document Revised: 09/09/2014 Document Reviewed: 04/15/2011 [employer] Interactive Patient Education 2016 [employer] Strain With Rehab A strain is an injury in which a tendon or muscle is torn. The muscles and tendons of the lower back are vulnerable to strains. However, these muscles and tendons are very strong and require a great force to be injured. Strains are classified into three categories. Grade 1 strains cause pain, but the tendon is not lengthened. Grade 2 strains include a lengthened ligament, due to the ligament being stretched or partially ruptured. With grade 2 strains there is still function, although the function may be decreased. Grade 3 strains involve a complete tear of the tendon or muscle, and function is usually impaired. SYMPTOMS   Pain in the lower back.  Pain that affects one side more than the other.  Pain that gets worse with movement and may be felt in the hip, buttocks, or back of the thigh.  Muscle spasms of the muscles in the back.  Swelling along the muscles of the back.  Loss of strength of the back muscles.  Crackling sound (crepitation) when the muscles are touched. CAUSES  Lower back strains occur when a force is placed on the muscles or tendons that is greater than they can handle. Common causes of injury include:  Prolonged overuse of the muscle-tendon units in the lower back, usually from incorrect  posture.  A single violent injury or force applied to the back. RISK INCREASES WITH:  Sports that involve twisting forces on the spine or a lot of bending at the waist (football, rugby, weightlifting, bowling, golf, tennis, speed skating, racquetball, swimming, running, gymnastics, diving).  Poor strength and flexibility.  Failure to warm up properly before activity.  Family history of lower back pain or disk disorders.  Previous back injury or surgery (especially fusion).  Poor posture with lifting, especially heavy objects.  Prolonged sitting, especially with poor posture. PREVENTION   Learn and use proper posture when sitting or lifting (maintain proper posture when sitting, lift using the knees and legs, not at the waist).  Warm up and stretch properly before activity.  Allow for adequate recovery between workouts.  Maintain physical fitness:  Strength, flexibility, and endurance.  Cardiovascular fitness. PROGNOSIS  If treated properly, lower back strains usually heal within 6 weeks. RELATED  COMPLICATIONS   Recurring symptoms, resulting in a chronic problem.  Chronic inflammation, scarring, and partial muscle-tendon tear.  Delayed healing or resolution of symptoms.  Prolonged disability. TREATMENT  Treatment first involves the use of ice and medicine, to reduce pain and inflammation. The use of strengthening and stretching exercises may help reduce pain with activity. These exercises may be performed at home or with a therapist. Severe injuries may require referral to a therapist for further evaluation and treatment, such as ultrasound. Your caregiver may advise that you wear a back brace or corset, to help reduce pain and discomfort. Often, prolonged bed rest results in greater harm then benefit. Corticosteroid injections may be recommended. However, these should be reserved for the most serious cases. It is important to avoid using your back when lifting objects. At  night, sleep on your back on a firm mattress with a pillow placed under your knees. If non-surgical treatment is unsuccessful, surgery may be needed.  MEDICATION   If pain medicine is needed, nonsteroidal anti-inflammatory medicines (aspirin and ibuprofen), or other minor pain relievers (acetaminophen), are often advised.  Do not take pain medicine for 7 days before surgery.  Prescription pain relievers may be given, if your caregiver thinks they are needed. Use only as directed and only as much as you need.  Ointments applied to the skin may be helpful.  Corticosteroid injections may be given by your caregiver. These injections should be reserved for the most serious cases, because they may only be given a certain number of times. HEAT AND COLD  Cold treatment (icing) should be applied for 10 to 15 minutes every 2 to 3 hours for inflammation and pain, and immediately after activity that aggravates your symptoms. Use ice packs or an ice massage.  Heat treatment may be used before performing stretching and strengthening activities prescribed by your caregiver, physical therapist, or athletic trainer. Use a heat pack or a warm water soak. SEEK MEDICAL CARE IF:   Symptoms get worse or do not improve in 2 to 4 weeks, despite treatment.  You develop numbness, weakness, or loss of bowel or bladder function.  New, unexplained symptoms develop. (Drugs used in treatment may produce side effects.) EXERCISES  RANGE OF MOTION (ROM) AND STRETCHING EXERCISES - Low Back Strain Most people with lower back pain will find that their symptoms get worse with excessive bending forward (flexion) or arching at the lower back (extension). The exercises which will help resolve your symptoms will focus on the opposite motion.  Your physician, physical therapist or athletic trainer will help you determine which exercises will be most helpful to resolve your lower back pain. Do not complete any exercises without  first consulting with your caregiver. Discontinue any exercises which make your symptoms worse until you speak to your caregiver.  If you have pain, numbness or tingling which travels down into your buttocks, leg or foot, the goal of the therapy is for these symptoms to move closer to your back and eventually resolve. Sometimes, these leg symptoms will get better, but your lower back pain may worsen. This is typically an indication of progress in your rehabilitation. Be very alert to any changes in your symptoms and the activities in which you participated in the 24 hours prior to the change. Sharing this information with your caregiver will allow him/her to most efficiently treat your condition.  These exercises may help you when beginning to rehabilitate your injury. Your symptoms may resolve with or without further involvement from  your physician, physical therapist or athletic trainer. While completing these exercises, remember:  Restoring tissue flexibility helps normal motion to return to the joints. This allows healthier, less painful movement and activity.  An effective stretch should be held for at least 30 seconds.  A stretch should never be painful. You should only feel a gentle lengthening or release in the stretched tissue. FLEXION RANGE OF MOTION AND STRETCHING EXERCISES: STRETCH - Flexion, Single Knee to Chest   Lie on a firm bed or floor with both legs extended in front of you.  Keeping one leg in contact with the floor, bring your opposite knee to your chest. Hold your leg in place by either grabbing behind your thigh or at your knee.  Pull until you feel a gentle stretch in your lower back. Hold __________ seconds.  Slowly release your grasp and repeat the exercise with the opposite side. Repeat __________ times. Complete this exercise __________ times per day.  STRETCH - Flexion, Double Knee to Chest   Lie on a firm bed or floor with both legs extended in front of  you.  Keeping one leg in contact with the floor, bring your opposite knee to your chest.  Tense your stomach muscles to support your back and then lift your other knee to your chest. Hold your legs in place by either grabbing behind your thighs or at your knees.  Pull both knees toward your chest until you feel a gentle stretch in your lower back. Hold __________ seconds.  Tense your stomach muscles and slowly return one leg at a time to the floor. Repeat __________ times. Complete this exercise __________ times per day.  STRETCH - Low Trunk Rotation  Lie on a firm bed or floor. Keeping your legs in front of you, bend your knees so they are both pointed toward the ceiling and your feet are flat on the floor.  Extend your arms out to the side. This will stabilize your upper body by keeping your shoulders in contact with the floor.  Gently and slowly drop both knees together to one side until you feel a gentle stretch in your lower back. Hold for __________ seconds.  Tense your stomach muscles to support your lower back as you bring your knees back to the starting position. Repeat the exercise to the other side. Repeat __________ times. Complete this exercise __________ times per day  EXTENSION RANGE OF MOTION AND FLEXIBILITY EXERCISES: STRETCH - Extension, Prone on Elbows   Lie on your stomach on the floor, a bed will be too soft. Place your palms about shoulder width apart and at the height of your head.  Place your elbows under your shoulders. If this is too painful, stack pillows under your chest.  Allow your body to relax so that your hips drop lower and make contact more completely with the floor.  Hold this position for __________ seconds.  Slowly return to lying flat on the floor. Repeat __________ times. Complete this exercise __________ times per day.  RANGE OF MOTION - Extension, Prone Press Ups  Lie on your stomach on the floor, a bed will be too soft. Place your palms  about shoulder width apart and at the height of your head.  Keeping your back as relaxed as possible, slowly straighten your elbows while keeping your hips on the floor. You may adjust the placement of your hands to maximize your comfort. As you gain motion, your hands will come more underneath your shoulders.  Hold this  position __________ seconds.  Slowly return to lying flat on the floor. Repeat __________ times. Complete this exercise __________ times per day.  RANGE OF MOTION- Quadruped, Neutral Spine   Assume a hands and knees position on a firm surface. Keep your hands under your shoulders and your knees under your hips. You may place padding under your knees for comfort.  Drop your head and point your tail bone toward the ground below you. This will round out your lower back like an angry cat. Hold this position for __________ seconds.  Slowly lift your head and release your tail bone so that your back sags into a large arch, like an old horse.  Hold this position for __________ seconds.  Repeat this until you feel limber in your lower back.  Now, find your "sweet spot." This will be the most comfortable position somewhere between the two previous positions. This is your neutral spine. Once you have found this position, tense your stomach muscles to support your lower back.  Hold this position for __________ seconds. Repeat __________ times. Complete this exercise __________ times per day.  STRENGTHENING EXERCISES - Low Back Strain These exercises may help you when beginning to rehabilitate your injury. These exercises should be done near your "sweet spot." This is the neutral, low-back arch, somewhere between fully rounded and fully arched, that is your least painful position. When performed in this safe range of motion, these exercises can be used for people who have either a flexion or extension based injury. These exercises may resolve your symptoms with or without further  involvement from your physician, physical therapist or athletic trainer. While completing these exercises, remember:   Muscles can gain both the endurance and the strength needed for everyday activities through controlled exercises.  Complete these exercises as instructed by your physician, physical therapist or athletic trainer. Increase the resistance and repetitions only as guided.  You may experience muscle soreness or fatigue, but the pain or discomfort you are trying to eliminate should never worsen during these exercises. If this pain does worsen, stop and make certain you are following the directions exactly. If the pain is still present after adjustments, discontinue the exercise until you can discuss the trouble with your caregiver. STRENGTHENING - Deep Abdominals, Pelvic Tilt  Lie on a firm bed or floor. Keeping your legs in front of you, bend your knees so they are both pointed toward the ceiling and your feet are flat on the floor.  Tense your lower abdominal muscles to press your lower back into the floor. This motion will rotate your pelvis so that your tail bone is scooping upwards rather than pointing at your feet or into the floor.  With a gentle tension and even breathing, hold this position for __________ seconds. Repeat __________ times. Complete this exercise __________ times per day.  STRENGTHENING - Abdominals, Crunches   Lie on a firm bed or floor. Keeping your legs in front of you, bend your knees so they are both pointed toward the ceiling and your feet are flat on the floor. Cross your arms over your chest.  Slightly tip your chin down without bending your neck.  Tense your abdominals and slowly lift your trunk high enough to just clear your shoulder blades. Lifting higher can put excessive stress on the lower back and does not further strengthen your abdominal muscles.  Control your return to the starting position. Repeat __________ times. Complete this exercise  __________ times per day.  STRENGTHENING - Quadruped,  Opposite UE/LE Lift   Assume a hands and knees position on a firm surface. Keep your hands under your shoulders and your knees under your hips. You may place padding under your knees for comfort.  Find your neutral spine and gently tense your abdominal muscles so that you can maintain this position. Your shoulders and hips should form a rectangle that is parallel with the floor and is not twisted.  Keeping your trunk steady, lift your right hand no higher than your shoulder and then your left leg no higher than your hip. Make sure you are not holding your breath. Hold this position __________ seconds.  Continuing to keep your abdominal muscles tense and your back steady, slowly return to your starting position. Repeat with the opposite arm and leg. Repeat __________ times. Complete this exercise __________ times per day.  STRENGTHENING - Lower Abdominals, Double Knee Lift  Lie on a firm bed or floor. Keeping your legs in front of you, bend your knees so they are both pointed toward the ceiling and your feet are flat on the floor.  Tense your abdominal muscles to brace your lower back and slowly lift both of your knees until they come over your hips. Be certain not to hold your breath.  Hold __________ seconds. Using your abdominal muscles, return to the starting position in a slow and controlled manner. Repeat __________ times. Complete this exercise __________ times per day.  POSTURE AND BODY MECHANICS CONSIDERATIONS - Low Back Strain Keeping correct posture when sitting, standing or completing your activities will reduce the stress put on different body tissues, allowing injured tissues a chance to heal and limiting painful experiences. The following are general guidelines for improved posture. Your physician or physical therapist will provide you with any instructions specific to your needs. While reading these guidelines, remember:  The  exercises prescribed by your provider will help you have the flexibility and strength to maintain correct postures.  The correct posture provides the best environment for your joints to work. All of your joints have less wear and tear when properly supported by a spine with good posture. This means you will experience a healthier, less painful body.  Correct posture must be practiced with all of your activities, especially prolonged sitting and standing. Correct posture is as important when doing repetitive low-stress activities (typing) as it is when doing a single heavy-load activity (lifting). RESTING POSITIONS Consider which positions are most painful for you when choosing a resting position. If you have pain with flexion-based activities (sitting, bending, stooping, squatting), choose a position that allows you to rest in a less flexed posture. You would want to avoid curling into a fetal position on your side. If your pain worsens with extension-based activities (prolonged standing, working overhead), avoid resting in an extended position such as sleeping on your stomach. Most people will find more comfort when they rest with their spine in a more neutral position, neither too rounded nor too arched. Lying on a non-sagging bed on your side with a pillow between your knees, or on your back with a pillow under your knees will often provide some relief. Keep in mind, being in any one position for a prolonged period of time, no matter how correct your posture, can still lead to stiffness. PROPER SITTING POSTURE In order to minimize stress and discomfort on your spine, you must sit with correct posture. Sitting with good posture should be effortless for a healthy body. Returning to good posture is a gradual  process. Many people can work toward this most comfortably by using various supports until they have the flexibility and strength to maintain this posture on their own. When sitting with proper posture,  your ears will fall over your shoulders and your shoulders will fall over your hips. You should use the back of the chair to support your upper back. Your lower back will be in a neutral position, just slightly arched. You may place a small pillow or folded towel at the base of your lower back for support.  When working at a desk, create an environment that supports good, upright posture. Without extra support, muscles tire, which leads to excessive strain on joints and other tissues. Keep these recommendations in mind: CHAIR:  A chair should be able to slide under your desk when your back makes contact with the back of the chair. This allows you to work closely.  The chair's height should allow your eyes to be level with the upper part of your monitor and your hands to be slightly lower than your elbows. BODY POSITION  Your feet should make contact with the floor. If this is not possible, use a foot rest.  Keep your ears over your shoulders. This will reduce stress on your neck and lower back. INCORRECT SITTING POSTURES  If you are feeling tired and unable to assume a healthy sitting posture, do not slouch or slump. This puts excessive strain on your back tissues, causing more damage and pain. Healthier options include:  Using more support, like a lumbar pillow.  Switching tasks to something that requires you to be upright or walking.  Talking a brief walk.  Lying down to rest in a neutral-spine position. PROLONGED STANDING WHILE SLIGHTLY LEANING FORWARD  When completing a task that requires you to lean forward while standing in one place for a long time, place either foot up on a stationary 2-4 inch high object to help maintain the best posture. When both feet are on the ground, the lower back tends to lose its slight inward curve. If this curve flattens (or becomes too large), then the back and your other joints will experience too much stress, tire more quickly, and can cause  pain. CORRECT STANDING POSTURES Proper standing posture should be assumed with all daily activities, even if they only take a few moments, like when brushing your teeth. As in sitting, your ears should fall over your shoulders and your shoulders should fall over your hips. You should keep a slight tension in your abdominal muscles to brace your spine. Your tailbone should point down to the ground, not behind your body, resulting in an over-extended swayback posture.  INCORRECT STANDING POSTURES  Common incorrect standing postures include a forward head, locked knees and/or an excessive swayback. WALKING Walk with an upright posture. Your ears, shoulders and hips should all line-up. PROLONGED ACTIVITY IN A FLEXED POSITION When completing a task that requires you to bend forward at your waist or lean over a low surface, try to find a way to stabilize 3 out of 4 of your limbs. You can place a hand or elbow on your thigh or rest a knee on the surface you are reaching across. This will provide you more stability so that your muscles do not fatigue as quickly. By keeping your knees relaxed, or slightly bent, you will also reduce stress across your lower back. CORRECT LIFTING TECHNIQUES DO :   Assume a wide stance. This will provide you more stability and the  opportunity to get as close as possible to the object which you are lifting.  Tense your abdominals to brace your spine. Bend at the knees and hips. Keeping your back locked in a neutral-spine position, lift using your leg muscles. Lift with your legs, keeping your back straight.  Test the weight of unknown objects before attempting to lift them.  Try to keep your elbows locked down at your sides in order get the best strength from your shoulders when carrying an object.  Always ask for help when lifting heavy or awkward objects. INCORRECT LIFTING TECHNIQUES DO NOT:   Lock your knees when lifting, even if it is a small object.  Bend and  twist. Pivot at your feet or move your feet when needing to change directions.  Assume that you can safely pick up even a paper clip without proper posture.   This information is not intended to replace advice given to you by your health care provider. Make sure you discuss any questions you have with your health care provider.  Follow up with your primary care provider if symptoms do not improve. Apply ice to affected area. Return to the ED if you experience worsening of your symptoms, numbness in both legs, bowel/bladder incontinence.

## 2015-09-08 NOTE — ED Provider Notes (Signed)
CSN: [id number]     Arrival date & time 09/08/15  1108 History  By signing my name below, I, Eustaquio Maize, attest that this documentation has been prepared under the direction and in the presence of [employer], PA-C. Electronically Signed: Eustaquio Maize, ED Scribe. 09/08/2015. 12:24 PM.   Chief Complaint  Patient presents with  . Back Pain   The history is provided by the patient. No language interpreter was used.     HPI Comments: Carla Brooks is a 45 y.o. female who presents to the Emergency Department complaining of gradual onset, constant, left lower back pain with numbness in left leg x 2 months. No known injury to the back. She has been taking Ibuprofen and applying Salonpas patches without relief. Pt mentions that 2 days ago she fell in the shower and landed onto her back, causing exacerbated pain to the back. No head injury or LOC. The worsening pain lasted for about 1 hour until it subsided to her usual back pain. Denies urinary or bowel incontinence, fever, weakness, tingling, or any other associated symptoms. Pt is able to ambulate without difficulty.   Past Medical History  Diagnosis Date  . Diabetes mellitus   . Hypertension   . Bronchitis   . Arthritis   . GERD (gastroesophageal reflux disease)   . Asthma   . Migraines 02/21/2012  . Anemia 02/21/2012  . HTN (hypertension) 02/21/2012  . Diabetes mellitus ([place]) 02/21/2012  . Abnormal Pap smear   . Fibroid    Past Surgical History  Procedure Laterality Date  . Cholecystectomy  09/27/2001  . Cesarean section N/A 10/29/2012    Procedure: CESAREAN SECTION;  Surgeon: Florian Buff, MD;  Location: [facility];  Service: Obstetrics;  Laterality: N/A;   Family History  Problem Relation Age of Onset  . Coronary artery disease Father   . Cancer Paternal Uncle     throat  . Diabetes Maternal Grandmother   . Cancer Maternal Grandfather     prostate  . Cancer Paternal Grandfather     lung   Social History  Substance Use  Topics  . Smoking status: Never Smoker   . Smokeless tobacco: Never Used  . Alcohol Use: No   OB History    Gravida Para Term Preterm AB TAB SAB Ectopic Multiple Living   3 3 2 1      3      Review of Systems  Gastrointestinal:       Negative for bowel incontinence  Genitourinary:       Negative for urinary incontinence  Musculoskeletal: Positive for back pain. Negative for gait problem.  Neurological: Positive for numbness (Left leg). Negative for weakness.  All other systems reviewed and are negative.   Allergies  Penicillins; Pork-derived products; and Shrimp  Home Medications   Prior to Admission medications   Medication Sig Start Date End Date Taking? Authorizing Provider  ACCU-CHEK FASTCLIX LANCETS MISC 4 Units by Percutaneous route 4 (four) times daily. 06/29/12   Seabron Spates, CNM  albuterol (PROVENTIL HFA;VENTOLIN HFA) 108 (90 BASE) MCG/ACT inhaler Inhale 2 puffs into the lungs every 4 (four) hours as needed for wheezing or shortness of breath. For wheezing    Historical Provider, MD  azithromycin (ZITHROMAX Z-PAK) 250 MG tablet 2 po day one, then 1 daily x 4 days 08/13/15   Mercedes Camprubi-Soms, PA-C  diclofenac sodium (VOLTAREN) 1 % GEL Apply 4 g topically 4 (four) times daily. To wrist 11/19/13   Billy Fischer, MD  docusate sodium (COLACE) 100 MG capsule Take 1 capsule (100 mg total) by mouth 2 (two) times daily as needed for constipation. 11/02/12   Martha Clan, MD  EPINEPHrine (EPIPEN) 0.3 mg/0.3 mL DEVI Inject 0.3 mLs (0.3 mg total) into the muscle as needed. Use as directed in the event of a severe allergic reaction 03/02/12   Lajean Saver, MD  ferrous sulfate 325 (65 FE) MG tablet Take 1 tablet (325 mg total) by mouth 2 (two) times daily with a meal. 11/02/12   Martha Clan, MD  glucose blood (ACCU-CHEK SMARTVIEW) test strip Use as instructed 06/29/12   Seabron Spates, CNM  ibuprofen (ADVIL,MOTRIN) 600 MG tablet Take 1 tablet (600 mg total) by mouth every 6 (six)  hours. 11/02/12   Martha Clan, MD  lisinopril-hydrochlorothiazide (PRINZIDE,ZESTORETIC) 20-25 MG per tablet Take 2 tablets by mouth daily. 11/02/12   Martha Clan, MD  metFORMIN (GLUCOPHAGE) 1000 MG tablet Take 1,000 mg by mouth 2 (two) times daily with a meal.    Historical Provider, MD  metFORMIN (GLUCOPHAGE) 1000 MG tablet Take 1 tablet (1,000 mg total) by mouth 2 (two) times daily with a meal. 11/02/12   Martha Clan, MD  omeprazole (PRILOSEC) 20 MG capsule Take 20 mg by mouth 2 (two) times daily.    Historical Provider, MD  ondansetron (ZOFRAN) 4 MG tablet Take 1 tablet (4 mg total) by mouth every 6 (six) hours. 02/08/14   Cleatrice Burke, PA-C  oxyCODONE-acetaminophen (PERCOCET/ROXICET) 5-325 MG per tablet Take 1-2 tablets by mouth every 4 (four) hours as needed. 11/02/12   Martha Clan, MD  oxyCODONE-acetaminophen (PERCOCET/ROXICET) 5-325 MG per tablet Take 2 tablets by mouth every 6 (six) hours as needed for moderate pain or severe pain. 02/08/14   Cleatrice Burke, PA-C  pramoxine-hydrocortisone (PROCTOCREAM-HC) 1-1 % rectal cream Place rectally 3 (three) times daily. 10/14/12   Myrtis Ser, CNM   Triage Vitals:  BP 180/113 mmHg  Pulse 90  Temp(Src) 98.3 F (36.8 C) (Oral)  Resp 19  SpO2 97%  LMP 08/25/2015 (Approximate)   Physical Exam  Constitutional: She is oriented to person, place, and time. She appears well-developed and well-nourished. No distress.  HENT:  Head: Normocephalic and atraumatic.  Eyes: Conjunctivae are normal. Right eye exhibits no discharge. Left eye exhibits no discharge. No scleral icterus.  Cardiovascular: Normal rate.   Pulmonary/Chest: Effort normal.  Musculoskeletal: She exhibits tenderness.  Left lumbar paraspinal muscle tenderness No decreased ROM No midline spinal tenderness  Neurological: She is alert and oriented to person, place, and time. Coordination normal.  Strength 5/5 throughout No sensory deficits No gait abnormality  Skin: Skin is warm and dry.  No rash noted. She is not diaphoretic. No erythema. No pallor.  Psychiatric: She has a normal mood and affect. Her behavior is normal.  Nursing note and vitals reviewed.   ED Course  Procedures (including critical care time)  DIAGNOSTIC STUDIES: Oxygen Saturation is 97% on RA, normal by my interpretation.    COORDINATION OF CARE: 12:20 PM-Discussed treatment plan which includes DG L Spine with pt at bedside and pt agreed to plan.   Labs Review Labs Reviewed - No data to display  Imaging Review Dg Lumbar Spine Complete  09/08/2015  CLINICAL DATA:  Low back pain for 1 month. No known trauma. Pain radiating down to left thigh. EXAM: LUMBAR SPINE - COMPLETE 4+ VIEW COMPARISON:  None. FINDINGS: There is minimal levoscoliosis of the lumbar spine, which may be merely positional in nature. Osseous alignment  is otherwise normal. Bone mineralization is normal. No fracture line or displaced fracture fragment seen. No acute- appearing cortical irregularity or osseous lesion. Disc spaces appear well preserved throughout. No degenerative change seen. No evidence of pars interarticularis defect. Upper sacrum appears intact and well aligned. Paravertebral soft tissues are unremarkable. Cholecystectomy clips noted within the right upper quadrant. Scattered calcifications within the lower pelvis are most likely benign vascular phleboliths. IMPRESSION: No acute findings.  No degenerative change. Electronically Signed   By: Franki Cabot M.D.   On: 09/08/2015 13:54   I have personally reviewed and evaluated these images as part of my medical decision-making.   EKG Interpretation None      MDM   Final diagnoses:  Lumbar strain, initial encounter   Patient with back pain.  No neurological deficits and normal neuro exam.  Patient is ambulatory.  No loss of bowel or bladder control.  No concern for cauda equina.  No fever, night sweats, weight loss, h/o cancer, IVDA, no recent procedure to back. No urinary  symptoms suggestive of UTI. X-ray negative for acute fracture. Suspect lumbar strain.  Supportive care and return precaution discussed. Appears safe for discharge at this time. Follow up as indicated in discharge paperwork.    I personally performed the services described in this documentation, which was scribed in my presence. The recorded information has been reviewed and is accurate.       [place], PA-C 09/09/15 1914  Forde Dandy, MD 09/10/15 1759

## 2016-12-10 ENCOUNTER — Encounter (HOSPITAL_COMMUNITY): Payer: Self-pay | Admitting: Emergency Medicine

## 2016-12-10 ENCOUNTER — Emergency Department (HOSPITAL_COMMUNITY): Payer: Medicaid Other

## 2016-12-10 ENCOUNTER — Emergency Department (HOSPITAL_COMMUNITY)
Admission: EM | Admit: 2016-12-10 | Discharge: 2016-12-11 | Disposition: A | Discharge status: Home / Self Care | Payer: Medicaid Other | Attending: Emergency Medicine | Admitting: Emergency Medicine

## 2016-12-10 DIAGNOSIS — J45909 Unspecified asthma, uncomplicated: Secondary | ICD-10-CM | POA: Insufficient documentation

## 2016-12-10 DIAGNOSIS — Z7982 Long term (current) use of aspirin: Secondary | ICD-10-CM | POA: Insufficient documentation

## 2016-12-10 DIAGNOSIS — Z79899 Other long term (current) drug therapy: Secondary | ICD-10-CM | POA: Insufficient documentation

## 2016-12-10 DIAGNOSIS — E119 Type 2 diabetes mellitus without complications: Secondary | ICD-10-CM | POA: Insufficient documentation

## 2016-12-10 DIAGNOSIS — R072 Precordial pain: Secondary | ICD-10-CM | POA: Diagnosis not present

## 2016-12-10 DIAGNOSIS — M546 Pain in thoracic spine: Secondary | ICD-10-CM | POA: Insufficient documentation

## 2016-12-10 DIAGNOSIS — I10 Essential (primary) hypertension: Secondary | ICD-10-CM | POA: Insufficient documentation

## 2016-12-10 LAB — I-STAT BETA HCG BLOOD, ED (MC, WL, AP ONLY): I-stat hCG, quantitative: <5 m[IU]/mL (ref <5)

## 2016-12-10 MED ORDER — LISINOPRIL 20 MG PO TABS
20.0000 mg | ORAL_TABLET | Freq: Once | ORAL | Status: AC
  Administered 2016-12-11: 20 mg via ORAL
  Filled 2016-12-10: qty 1

## 2016-12-10 NOTE — ED Notes (Signed)
Bed: [facility] Expected date:  Expected time:  Means of arrival:  Comments: 

## 2016-12-10 NOTE — ED Triage Notes (Signed)
Per EMS pt from home c/o pain that originates in L shoulder and migrates to hip and down leg. Pt also has hypertension, hx hypertension, untreated. Pt took flexaril x2 hours ago for pain. Current pain stated 9/10. Pt also reports "I breathe better laying down". Lung sounds clear, Oxygen saturation 98% RA. Pt AO.

## 2016-12-10 NOTE — ED Provider Notes (Signed)
[facility] DEPT Provider Note   CSN: [id number] Arrival date & time: 12/10/16  2136   By signing my name below, I, Eunice Blase, attest that this documentation has been prepared under the direction and in the presence of Jola Schmidt, MD. Electronically signed, Eunice Blase, ED Scribe. 12/10/16. 11:58 PM.   History   Chief Complaint Chief Complaint  Patient presents with  . Hypertension  . Back Pain   The history is provided by the patient and medical records. No language interpreter was used.    Carla Brooks is a 46 y.o. female with h/o HTN, transported by EMS to the Emergency Department with concern for gradually worsening low chronic back pain since yesterday. Pt describes 9/10, constant, sharp, non radiating R > L mid back pain that is improved with certain positions and worsened with movement and lying flat, unaffected with ibuprofen. She notes associated SOB with laying flat onset today, cough that has improved, R sided abdominal pain onset en route to [facility] ED with EMS PTA. Pt reportedly has not taken prescribed lisinopril 10 mg for HTN x > 1 year for personal reasons. She is reportedly R handed. Pt denies activity change, weakness, chest pain, N/V/D and Hx of blood clots.  Past Medical History:  Diagnosis Date  . Abnormal Pap smear   . Anemia 02/21/2012  . Arthritis   . Asthma   . Bronchitis   . Diabetes mellitus   . Diabetes mellitus ([place]) 02/21/2012  . Fibroid   . GERD (gastroesophageal reflux disease)   . HTN (hypertension) 02/21/2012  . Hypertension   . Migraines 02/21/2012    Patient Active Problem List   Diagnosis Date Noted  . Uterine fibroids affecting pregnancy, antepartum 07/27/2012  . Vaginal bleeding in pregnancy 07/27/2012  . Pap smear abnormality of cervix with LGSIL 06/04/2012  . GBS (group B streptococcus) UTI complicating pregnancy 37/34/2876  . Diabetes mellitus, antepartum-class B 06/01/2012  . Chronic hypertension with superimposed  preeclampsia 06/01/2012  . AMA (advanced maternal age) multigravida 35+ 06/01/2012  . Unspecified high-risk pregnancy 06/01/2012  . Migraines 02/21/2012  . Anemia 02/21/2012  . GERD (gastroesophageal reflux disease) 02/21/2012  . HTN (hypertension) 02/21/2012  . Diabetes mellitus ([place]) 02/21/2012  . Arthritis 02/21/2012    Past Surgical History:  Procedure Laterality Date  . CESAREAN SECTION N/A 10/29/2012   Procedure: CESAREAN SECTION;  Surgeon: Florian Buff, MD;  Location: [facility];  Service: Obstetrics;  Laterality: N/A;  . CHOLECYSTECTOMY  09/27/2001    OB History    Gravida Para Term Preterm AB Living   3 3 2 1   3    SAB TAB Ectopic Multiple Live Births           3       Home Medications    Prior to Admission medications   Medication Sig Start Date End Date Taking? Authorizing Provider  aspirin-acetaminophen-caffeine (EXCEDRIN MIGRAINE) [phone] MG tablet Take 2 tablets by mouth every 6 (six) hours as needed for headache.    Yes Historical Provider, MD  EPINEPHrine (EPIPEN) 0.3 mg/0.3 mL DEVI Inject 0.3 mLs (0.3 mg total) into the muscle as needed. Use as directed in the event of a severe allergic reaction 03/02/12  Yes Lajean Saver, MD    Family History Family History  Problem Relation Age of Onset  . Coronary artery disease Father   . Cancer Paternal Uncle     throat  . Diabetes Maternal Grandmother   . Cancer Maternal Grandfather  prostate  . Cancer Paternal Grandfather     lung    Social History Social History  Substance Use Topics  . Smoking status: Never Smoker  . Smokeless tobacco: Never Used  . Alcohol use Yes     Comment: occasional     Allergies   Shrimp [shellfish allergy]; Hydrocodone; Penicillins; and Pork-derived products   Review of Systems Review of Systems  All other systems reviewed and all systems are negative for acute changes except as noted in the HPI and PMH.    Physical Exam Updated Vital Signs BP (!) 187/101 (BP  Location: Right Arm)   Pulse 85   Temp 98.1 F (36.7 C) (Oral)   Resp 19   LMP 12/09/2016   SpO2 95%   Physical Exam  Constitutional: She is oriented to person, place, and time. She appears well-developed and well-nourished. No distress.  HENT:  Head: Normocephalic and atraumatic.  Eyes: EOM are normal.  Neck: Normal range of motion.  Cardiovascular: Normal rate, regular rhythm and normal heart sounds.   Pulmonary/Chest: Effort normal and breath sounds normal.  Abdominal: Soft. She exhibits no distension. There is no tenderness.  Musculoskeletal: Normal range of motion.  Tenderness L lateral thoracic region  Neurological: She is alert and oriented to person, place, and time.  Skin: Skin is warm and dry.  Psychiatric: She has a normal mood and affect. Judgment normal.  Nursing note and vitals reviewed.    ED Treatments / Results  DIAGNOSTIC STUDIES: Oxygen Saturation is 95% on RA, adequate by my interpretation.    COORDINATION OF CARE: 11:32 PM Discussed treatment plan with pt at bedside and pt agreed to plan. Will order medications, labs and imaging then reassess.  3:17 AM Pt prepared for d/c. Labs (all labs ordered are listed, but only abnormal results are displayed) Labs Reviewed  CBC - Abnormal; Notable for the following:       Result Value   RBC 3.57 (*)    Hemoglobin 10.2 (*)    HCT 31.2 (*)    All other components within normal limits  BASIC METABOLIC PANEL - Abnormal; Notable for the following:    Potassium 3.3 (*)    Glucose, Bld 117 (*)    Calcium 8.5 (*)    All other components within normal limits  D-DIMER, QUANTITATIVE (NOT AT [facility])  TROPONIN I  I-STAT BETA HCG BLOOD, ED ([facility], [facility], [facility] ONLY)    EKG  EKG Interpretation  Date/Time:  Tuesday December 10 2016 22:12:06 EDT Ventricular Rate:  86 PR Interval:    QRS Duration: 108 QT Interval:  379 QTC Calculation: 454 R Axis:   68 Text Interpretation:  Sinus rhythm Borderline T wave abnormalities Baseline  wander in lead(s) II III aVF V6 No significant change was found Confirmed by Mliss Wedin  MD, Rebecca Motta ([id number]) on 12/11/2016 1:11:14 AM       Radiology Dg Chest 2 View  Result Date: 12/11/2016 CLINICAL DATA:  Upper back pain and cough x3 days EXAM: CHEST  2 VIEW COMPARISON:  02/21/2012 FINDINGS: The heart size and mediastinal contours are within normal limits. Both lungs are clear. The visualized skeletal structures are unremarkable. IMPRESSION: No active cardiopulmonary disease. Electronically Signed   By: Ashley Royalty M.D.   On: 12/11/2016 00:21    Procedures Procedures (including critical care time)  Medications Ordered in ED Medications  ketorolac (TORADOL) injection 60 mg (not administered)  diazepam (VALIUM) tablet 5 mg (not administered)  oxyCODONE-acetaminophen (PERCOCET/ROXICET) 5-325 MG per tablet 1  tablet (not administered)  lisinopril (PRINIVIL,ZESTRIL) tablet 20 mg (20 mg Oral Given 12/11/16 0008)     Initial Impression / Assessment and Plan / ED Course  I have reviewed the triage vital signs and the nursing notes.  Pertinent labs & imaging results that were available during my care of the patient were reviewed by me and considered in my medical decision making (see chart for details).     3:22 AM Patient feels much better this time.  Overall well-appearing.  The majority of her pain seems to be more musculoskeletal in nature in terms of tenderness over her left back and scapular region.  There was a pleuritic component but her d-dimer is normal.  Chest x-ray without abnormality.  Labs normal.  EKG without ischemic changes.  Doubt ACS.  Doubt PE.  Discharge home in good condition.  Home with anti-inflammatories and muscle relaxants.  Primary care follow-up.  Patient understands to return to the emergency department for new or worsening symptoms  Final Clinical Impressions(s) / ED Diagnoses   Final diagnoses:  Precordial pain  Acute left-sided thoracic back pain    New  Prescriptions New Prescriptions   No medications on file   I personally performed the services described in this documentation, which was scribed in my presence. The recorded information has been reviewed and is accurate.        Jola Schmidt, MD 12/11/16 [id number]

## 2016-12-11 LAB — CBC
HCT: 31.2 % — ABNORMAL LOW (ref 36.0–46.0)
HEMOGLOBIN: 10.2 g/dL — AB (ref 12.0–15.0)
MCH: 28.6 pg (ref 26.0–34.0)
MCHC: 32.7 g/dL (ref 30.0–36.0)
MCV: 87.4 fL (ref 78.0–100.0)
PLATELETS: 362 10*3/uL (ref 150–400)
RBC: 3.57 MIL/uL — AB (ref 3.87–5.11)
RDW: 14.6 % (ref 11.5–15.5)
WBC: 9.5 10*3/uL (ref 4.0–10.5)

## 2016-12-11 LAB — BASIC METABOLIC PANEL
Anion gap: 6 (ref 5–15)
BUN: 9 mg/dL (ref 6–20)
CHLORIDE: 108 mmol/L (ref 101–111)
CO2: 24 mmol/L (ref 22–32)
CREATININE: 0.82 mg/dL (ref 0.44–1.00)
Calcium: 8.5 mg/dL — ABNORMAL LOW (ref 8.9–10.3)
GFR calc Af Amer: >60 mL/min (ref >60)
GFR calc non Af Amer: >60 mL/min (ref >60)
GLUCOSE: 117 mg/dL — AB (ref 65–99)
Potassium: 3.3 mmol/L — ABNORMAL LOW (ref 3.5–5.1)
SODIUM: 138 mmol/L (ref 135–145)

## 2016-12-11 LAB — TROPONIN I: Troponin I: <0.03 ng/mL (ref <0.03)

## 2016-12-11 LAB — D-DIMER, QUANTITATIVE: D-Dimer, Quant: 0.41 ug/mL-FEU (ref 0.00–0.50)

## 2016-12-11 MED ORDER — OXYCODONE-ACETAMINOPHEN 5-325 MG PO TABS
1.0000 | ORAL_TABLET | Freq: Once | ORAL | Status: AC
  Administered 2016-12-11: 1 via ORAL
  Filled 2016-12-11: qty 1

## 2016-12-11 MED ORDER — KETOROLAC TROMETHAMINE 60 MG/2ML IM SOLN
60.0000 mg | Freq: Once | INTRAMUSCULAR | Status: DC
  Filled 2016-12-11: qty 2

## 2016-12-11 MED ORDER — METHOCARBAMOL 500 MG PO TABS: 500.0000 mg | ORAL_TABLET | Freq: Three times a day (TID) | ORAL | 0 refills | Status: DC | PRN

## 2016-12-11 MED ORDER — DIAZEPAM 5 MG PO TABS
5.0000 mg | ORAL_TABLET | Freq: Once | ORAL | Status: AC
  Administered 2016-12-11: 5 mg via ORAL
  Filled 2016-12-11: qty 1

## 2016-12-11 MED ORDER — IBUPROFEN 600 MG PO TABS: 600.0000 mg | ORAL_TABLET | Freq: Three times a day (TID) | ORAL | 0 refills | Status: DC | PRN

## 2016-12-11 MED ORDER — KETOROLAC TROMETHAMINE 30 MG/ML IJ SOLN
30.0000 mg | Freq: Once | INTRAMUSCULAR | Status: AC
  Administered 2016-12-11: 30 mg via INTRAVENOUS
  Filled 2016-12-11: qty 1

## 2017-04-21 ENCOUNTER — Emergency Department (HOSPITAL_COMMUNITY)
Admission: EM | Admit: 2017-04-21 | Discharge: 2017-04-21 | Disposition: A | Discharge status: Home / Self Care | Payer: Medicaid Other | Attending: Emergency Medicine | Admitting: Emergency Medicine

## 2017-04-21 ENCOUNTER — Emergency Department (HOSPITAL_COMMUNITY): Payer: Medicaid Other

## 2017-04-21 DIAGNOSIS — I10 Essential (primary) hypertension: Secondary | ICD-10-CM | POA: Diagnosis not present

## 2017-04-21 DIAGNOSIS — J45909 Unspecified asthma, uncomplicated: Secondary | ICD-10-CM | POA: Diagnosis not present

## 2017-04-21 DIAGNOSIS — Y939 Activity, unspecified: Secondary | ICD-10-CM | POA: Insufficient documentation

## 2017-04-21 DIAGNOSIS — Y929 Unspecified place or not applicable: Secondary | ICD-10-CM | POA: Diagnosis not present

## 2017-04-21 DIAGNOSIS — E119 Type 2 diabetes mellitus without complications: Secondary | ICD-10-CM | POA: Diagnosis not present

## 2017-04-21 DIAGNOSIS — Y999 Unspecified external cause status: Secondary | ICD-10-CM | POA: Insufficient documentation

## 2017-04-21 DIAGNOSIS — S61432A Puncture wound without foreign body of left hand, initial encounter: Secondary | ICD-10-CM | POA: Insufficient documentation

## 2017-04-21 DIAGNOSIS — W540XXA Bitten by dog, initial encounter: Secondary | ICD-10-CM | POA: Diagnosis not present

## 2017-04-21 DIAGNOSIS — S6992XA Unspecified injury of left wrist, hand and finger(s), initial encounter: Secondary | ICD-10-CM | POA: Diagnosis present

## 2017-04-21 MED ORDER — OXYCODONE-ACETAMINOPHEN 5-325 MG PO TABS
ORAL_TABLET | ORAL | Status: AC
  Administered 2017-04-21: 1
  Filled 2017-04-21: qty 1

## 2017-04-21 MED ORDER — CLINDAMYCIN HCL 150 MG PO CAPS: 150.0000 mg | ORAL_CAPSULE | Freq: Four times a day (QID) | ORAL | 0 refills | Status: DC

## 2017-04-21 MED ORDER — LISINOPRIL 10 MG PO TABS: 10.0000 mg | ORAL_TABLET | Freq: Every day | ORAL | 0 refills | Status: DC

## 2017-04-21 MED ORDER — OXYCODONE-ACETAMINOPHEN 5-325 MG PO TABS: 2.0000 | ORAL_TABLET | ORAL | 0 refills | Status: DC | PRN

## 2017-04-21 MED ORDER — OXYCODONE-ACETAMINOPHEN 5-325 MG PO TABS
2.0000 | ORAL_TABLET | Freq: Once | ORAL | Status: AC
  Administered 2017-04-21: 1 via ORAL
  Filled 2017-04-21: qty 2
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
## 2017-04-21 NOTE — ED Notes (Signed)
Patient left hand wound cleaned and dried, then dressed using guaze and kerlex.

## 2017-04-21 NOTE — ED Provider Notes (Signed)
[facility] DEPT Provider Note   CSN: [id number] Arrival date & time: 04/21/17  1007     History   Chief Complaint Chief Complaint  Patient presents with  . Animal Bite    HPI Carla Brooks is a 46 y.o. female.  46 year old female here after sustaining a dog bite to her left hand. This is her animal and she was. Due to the dog becoming afraid of thunder. Complains of puncture wound to the palmar surface of the left hand. Denies any distal numbness or tingling to her left fingers.days of moderate pain with movement of her fingers. Denies any forearm swelling or erythema going up her forearm. No drainage noted from her hand. Patient's tetanus status is current      Past Medical History:  Diagnosis Date  . Abnormal Pap smear   . Anemia 02/21/2012  . Arthritis   . Asthma   . Bronchitis   . Diabetes mellitus   . Diabetes mellitus ([place]) 02/21/2012  . Fibroid   . GERD (gastroesophageal reflux disease)   . HTN (hypertension) 02/21/2012  . Hypertension   . Migraines 02/21/2012    Patient Active Problem List   Diagnosis Date Noted  . Uterine fibroids affecting pregnancy, antepartum 07/27/2012  . Vaginal bleeding in pregnancy 07/27/2012  . Pap smear abnormality of cervix with LGSIL 06/04/2012  . GBS (group B streptococcus) UTI complicating pregnancy 74/25/9563  . Diabetes mellitus, antepartum-class B 06/01/2012  . Chronic hypertension with superimposed preeclampsia 06/01/2012  . AMA (advanced maternal age) multigravida 35+ 06/01/2012  . Unspecified high-risk pregnancy 06/01/2012  . Migraines 02/21/2012  . Anemia 02/21/2012  . GERD (gastroesophageal reflux disease) 02/21/2012  . HTN (hypertension) 02/21/2012  . Diabetes mellitus ([place]) 02/21/2012  . Arthritis 02/21/2012    Past Surgical History:  Procedure Laterality Date  . CESAREAN SECTION N/A 10/29/2012   Procedure: CESAREAN SECTION;  Surgeon: Florian Buff, MD;  Location: [facility];  Service: Obstetrics;  Laterality:  N/A;  . CHOLECYSTECTOMY  09/27/2001    OB History    Gravida Para Term Preterm AB Living   3 3 2 1   3    SAB TAB Ectopic Multiple Live Births           3       Home Medications    Prior to Admission medications   Medication Sig Start Date End Date Taking? Authorizing Provider  aspirin-acetaminophen-caffeine (EXCEDRIN MIGRAINE) [phone] MG tablet Take 2 tablets by mouth every 6 (six) hours as needed for headache.    Yes [provider]  methocarbamol (ROBAXIN) 500 MG tablet Take 1 tablet (500 mg total) by mouth every 8 (eight) hours as needed for muscle spasms. 12/11/16  Yes Jola Schmidt, MD  EPINEPHrine (EPIPEN) 0.3 mg/0.3 mL DEVI Inject 0.3 mLs (0.3 mg total) into the muscle as needed. Use as directed in the event of a severe allergic reaction 03/02/12   Lajean Saver, MD  ibuprofen (ADVIL,MOTRIN) 600 MG tablet Take 1 tablet (600 mg total) by mouth every 8 (eight) hours as needed. Patient not taking: Reported on 04/21/2017 12/11/16   Jola Schmidt, MD    Family History Family History  Problem Relation Age of Onset  . Coronary artery disease Father   . Cancer Paternal Uncle        throat  . Diabetes Maternal Grandmother   . Cancer Maternal Grandfather        prostate  . Cancer Paternal Grandfather        lung  Social History Social History  Substance Use Topics  . Smoking status: Never Smoker  . Smokeless tobacco: Never Used  . Alcohol use Yes     Comment: occasional     Allergies   Shrimp [shellfish allergy]; Hydrocodone; Penicillins; and Pork-derived products   Review of Systems Review of Systems  All other systems reviewed and are negative.    Physical Exam Updated Vital Signs BP (!) 198/119 (BP Location: Right Arm)   Pulse 87   Temp 98.5 F (36.9 C) (Oral)   Resp 16   LMP 04/02/2017 (Approximate)   SpO2 97%   Physical Exam  Constitutional: She is oriented to person, place, and time. She appears well-developed and well-nourished.   Non-toxic appearance. No distress.  HENT:  Head: Normocephalic and atraumatic.  Eyes: Pupils are equal, round, and reactive to light. Conjunctivae, EOM and lids are normal.  Neck: Normal range of motion. Neck supple. No tracheal deviation present. No thyroid mass present.  Cardiovascular: Normal rate, regular rhythm and normal heart sounds.  Exam reveals no gallop.   No murmur heard. Pulmonary/Chest: Effort normal and breath sounds normal. No stridor. No respiratory distress. She has no decreased breath sounds. She has no wheezes. She has no rhonchi. She has no rales.  Abdominal: Soft. Normal appearance and bowel sounds are normal. She exhibits no distension. There is no tenderness. There is no rebound and no CVA tenderness.  Musculoskeletal: Normal range of motion. She exhibits no edema or tenderness.       Hands: Neurological: She is alert and oriented to person, place, and time. She has normal strength. No cranial nerve deficit or sensory deficit. GCS eye subscore is 4. GCS verbal subscore is 5. GCS motor subscore is 6.  Skin: Skin is warm and dry. No abrasion and no rash noted.  Psychiatric: She has a normal mood and affect. Her speech is normal and behavior is normal.  Nursing note and vitals reviewed.    ED Treatments / Results  Labs (all labs ordered are listed, but only abnormal results are displayed) Labs Reviewed - No data to display  EKG  EKG Interpretation None       Radiology Dg Hand Complete Left  Result Date: 04/21/2017 CLINICAL DATA:  Dog bite last evening with visible puncture wounds on exam. The patient is unable to completely straighten the fingers due to pain. EXAM: LEFT HAND - COMPLETE 3+ VIEW COMPARISON:  None in PACs FINDINGS: The bones are subjectively adequately mineralized. The joint spaces are well maintained. There is no acute fracture or dislocation. No radiopaque foreign bodies are observed. The observed portions of the wrist are normal. IMPRESSION: No  acute bony abnormality of the left hand is observed. No soft tissue foreign bodies are demonstrated. Electronically Signed   By: David  Martinique M.D.   On: 04/21/2017 10:35    Procedures Procedures (including critical care time)  Medications Ordered in ED Medications  oxyCODONE-acetaminophen (PERCOCET/ROXICET) 5-325 MG per tablet 2 tablet (not administered)     Initial Impression / Assessment and Plan / ED Course  I have reviewed the triage vital signs and the nursing notes.  Pertinent labs & imaging results that were available during my care of the patient were reviewed by me and considered in my medical decision making (see chart for details).     hand x-ray without foreign body.patient to be placed on clindamycin for treatment of possible infection and given prescription for pain medication and return precautions.  Final Clinical Impressions(s) /  ED Diagnoses   Final diagnoses:  None    New Prescriptions New Prescriptions   No medications on file     Lacretia Leigh, MD 04/21/17 1221

## 2017-04-21 NOTE — ED Notes (Signed)
Patient requesting prescription for Lisinopril.

## 2022-05-28 NOTE — Congregational Nurse Program (Signed)
  Dept: [phone]   Congregational Nurse Program Note  Date of Encounter: 05/28/2022  Past Medical History: Past Medical History:  Diagnosis Date   Abnormal Pap smear    Anemia 02/21/2012   Arthritis    Asthma    Bronchitis    Diabetes mellitus    Diabetes mellitus ([place]) 02/21/2012   Fibroid    GERD (gastroesophageal reflux disease)    HTN (hypertension) 02/21/2012   Hypertension    Migraines 02/21/2012    Encounter Details:   Moved to [place] with 43 yr. old child yesterday. Reviewed medical hx with client. PCP is Dr. Carlis Abbott at [facility]. with [facility]. Next appt. Nov. 28th. Says has a BP machine and glucometer and verbalizes understanding of need to monitor B/P as well as blood glucose. Also, has cardiac recording monitor at bedside. Says no dizzy spells for at least a month. Discussed safety & emergency plans while at [facility]. Sees many Specialists for care. Next appt. Nov. 20th with Dr. Marylou Mccoy for follow-up labs due to iron deficiency anemia.  Volney Presser, RN, BSN

## 2022-06-04 NOTE — Congregational Nurse Program (Signed)
  Dept: [phone]   Congregational Nurse Program Note  Date of Encounter: 06/04/2022  Past Medical History: Past Medical History:  Diagnosis Date   Abnormal Pap smear    Anemia 02/21/2012   Arthritis    Asthma    Bronchitis    Diabetes mellitus    Diabetes mellitus ([place]) 02/21/2012   Fibroid    GERD (gastroesophageal reflux disease)    HTN (hypertension) 02/21/2012   Hypertension    Migraines 02/21/2012    Encounter Details:  CNP Questionnaire - 06/04/22 1605       Questionnaire   Ask client: Do you give verbal consent for me to treat you today? Yes    Student Assistance N/A    Location Patient Served  [facility]    Visit Setting with Client [facility]    Patient Status Unhoused    Insurance [facility]    Insurance/Financial Assistance Referral N/A    Medical Provider Yes    Medical Referrals Made [facility] Appointment Made N/A    Recently w/o PCP, now 1st time PCP visit completed due to CNs referral or appointment made N/A    Food Have Food Insecurities    Transportation Need transportation assistance    Housing/Utilities No permanent housing    Interpersonal Safety N/A    Interventions Case Management;Educate;Counsel    Abnormal to Normal Screening Since Last CN Visit N/A    Screenings CN Performed N/A    Sent Client to Lab for: N/A    Did client attend any of the following based off CNs referral or appointments made? N/A    ED Visit Averted N/A    Life-Saving Intervention Made N/A      Questionnaire   Do you give verbal consent to treat you today? Yes    Location Patient [place] or Organization    Patient Status Homeless    Insurance [facility]    Insurance Referral N/A    Medication Have Medication Insecurities    Intervention Advocate;Case Management;Counsel;Educate;Support            Requesting referral to a Mental Health Counselor. Says she had counseling in [place] in the past but, prefers  to see someone locally. Spoke with Kenney Houseman, Education officer, museum, at [employer]. She often refers to [employer]. PC to [employer]. Confirmed they will accept Medicaid and [facility] coverage. Pt. is aware of location and can use Medicaid transportation or bus. Pt. says will call and make own appointment. Follow-up prn.  Volney Presser, RN, BSN

## 2022-06-11 NOTE — Congregational Nurse Program (Signed)
  Dept: [phone]   Congregational Nurse Program Note  Date of Encounter: 06/11/2022  Past Medical History: Past Medical History:  Diagnosis Date   Abnormal Pap smear    Anemia 02/21/2012   Arthritis    Asthma    Bronchitis    Diabetes mellitus    Diabetes mellitus ([place]) 02/21/2012   Fibroid    GERD (gastroesophageal reflux disease)    HTN (hypertension) 02/21/2012   Hypertension    Migraines 02/21/2012    Encounter Details:  CNP Questionnaire - 06/11/22 1530       Questionnaire   Ask client: Do you give verbal consent for me to treat you today? Yes    Student Assistance N/A    Location Patient Served  [facility]    Visit Setting with Client [facility]    Patient Status Unhoused    Insurance Medicaid    Insurance/Financial Assistance Referral N/A    Medication N/A    Medical Provider Yes    Screening Referrals Made N/A    Medical Referrals Made N/A    Medical Appointment Made N/A    Recently w/o PCP, now 1st time PCP visit completed due to CNs referral or appointment made N/A    Food Have Food Insecurities    Transportation N/A    Housing/Utilities No permanent housing    Interpersonal Safety N/A    Interventions Case Management;Educate;Counsel;Advocate/Support    Abnormal to Normal Screening Since Last CN Visit N/A    Screenings CN Performed N/A    Sent Client to Lab for: N/A    Did client attend any of the following based off CNs referral or appointments made? N/A    ED Visit Averted N/A    Life-Saving Intervention Made N/A      Questionnaire   Do you give verbal consent to treat you today? Yes    Location Patient [place] or Organization    Patient Status Homeless    Insurance [facility]    Insurance Referral N/A    Medication Have Medication Insecurities    Intervention Advocate;Case Management;Counsel;Educate;Support             Follow-up with client. Says got an appointment at [facility] on 12/12. First appt.  will be virtual then can meet face-to-face. C/O having another dizzy spell on Saturday. Denies having a cardiac episode at the time but, she's not sure what's causing them. Says she sent a note via My Chart to inform Neurologist of recent episode. Has an appt. on 10/20 at 10:00 am. Has Medicaid transportation set up. Past studies done 2 yrs. ago include sleep apnea testing and a sleep study. Says she was one point away from needing a CPAP machine but, is unsure of the results of her sleep study. These tests were completed in [place]. Says she does not usually sleep well. Encouraged to tell Neurologist at upcoming visit and to inform PCP as well. Will follow-up with client after 10/20 Neurologist visit.  Volney Presser, RN, BSN

## 2022-06-25 NOTE — Congregational Nurse Program (Signed)
  Dept: [phone]   Congregational Nurse Program Note  Date of Encounter: 06/25/2022  Past Medical History: Past Medical History:  Diagnosis Date   Abnormal Pap smear    Anemia 02/21/2012   Arthritis    Asthma    Bronchitis    Diabetes mellitus    Diabetes mellitus ([place]) 02/21/2012   Fibroid    GERD (gastroesophageal reflux disease)    HTN (hypertension) 02/21/2012   Hypertension    Migraines 02/21/2012    Encounter Details:  CNP Questionnaire - 06/25/22 1600       Questionnaire   Ask client: Do you give verbal consent for me to treat you today? Yes    Student Assistance N/A    Location Patient Served  [facility]    Visit Setting with Client [facility]    Patient Status Unhoused    Insurance Medicaid    Insurance/Financial Assistance Referral N/A    Medication N/A    Medical Provider Yes    Screening Referrals Made N/A    Medical Referrals Made N/A    Medical Appointment Made N/A    Recently w/o PCP, now 1st time PCP visit completed due to CNs referral or appointment made N/A    Food Have Food Insecurities    Transportation N/A    Housing/Utilities No permanent housing    Interpersonal Safety N/A    Interventions Case Management;Counsel;Advocate/Support    Abnormal to Normal Screening Since Last CN Visit N/A    Screenings CN Performed N/A    Sent Client to Lab for: N/A    Did client attend any of the following based off CNs referral or appointments made? N/A    ED Visit Averted N/A    Life-Saving Intervention Made N/A      Questionnaire   Do you give verbal consent to treat you today? Yes    Location Patient [place] or Organization    Patient Status Homeless    Insurance [facility]    Insurance Referral N/A    Medication Have Medication Insecurities    Intervention Advocate;Case Management;Counsel;Educate;Support            Follow-up after 10/20 Neurology visit. Client says had an episode of hearing loss while at the appt.  States she was told it could either be silent seizures or vestibular migraines. Was scheduled for a Vestibular evaluation for Mon., 12/4 at 9:30 am. Client says will use the transportation system that will take her out of county and return. Is currently waiting on a refill of Hydroxyzine. Was instructed to try taking it during the day to help with hearing loss and imbalance episodes. Follow-up prn.  Volney Presser, RN, BSN

## 2022-07-09 NOTE — Congregational Nurse Program (Signed)
  Dept: [phone]   Congregational Nurse Program Note  Date of Encounter: 07/09/2022  Past Medical History: Past Medical History:  Diagnosis Date   Abnormal Pap smear    Anemia 02/21/2012   Arthritis    Asthma    Bronchitis    Diabetes mellitus    Diabetes mellitus ([place]) 02/21/2012   Fibroid    GERD (gastroesophageal reflux disease)    HTN (hypertension) 02/21/2012   Hypertension    Migraines 02/21/2012    Encounter Details:  CNP Questionnaire - 07/09/22 1555       Questionnaire   Ask client: Do you give verbal consent for me to treat you today? Yes    Student Assistance N/A    Location Patient Served  [facility]    Visit Setting with Client [facility]    Patient Status Unhoused    Insurance Medicaid    Insurance/Financial Assistance Referral N/A    Medication N/A    Medical Provider Yes    Screening Referrals Made N/A    Medical Referrals Made N/A    Medical Appointment Made N/A    Recently w/o PCP, now 1st time PCP visit completed due to CNs referral or appointment made N/A    Food Have Food Insecurities    Transportation N/A    Housing/Utilities No permanent housing    Interpersonal Safety N/A    Interventions Case Management;Advocate/Support;Counsel    Abnormal to Normal Screening Since Last CN Visit N/A    Screenings CN Performed N/A    Sent Client to Lab for: N/A    Did client attend any of the following based off CNs referral or appointments made? N/A    ED Visit Averted N/A    Life-Saving Intervention Made N/A      Questionnaire   Do you give verbal consent to treat you today? Yes    Location Patient [place] or Organization    Patient Status Homeless    Insurance [facility]    Insurance Referral N/A    Medication N/A    Intervention N/A;Case Management;Advocate             Says had to reschedule her appt. for lab work. Had Medicaid transportation scheduled but DSS had not completed her recert. Recert is now complete  and her appt. is rescheduled for next Monday and Medicaid transportation is set up. Says will update nurse after upcoming Dr's appts.   Volney Presser, RN, BSN

## 2022-07-11 NOTE — Congregational Nurse Program (Signed)
  Dept: [phone]   Congregational Nurse Program Note  Date of Encounter: 07/11/2022  Past Medical History: Past Medical History:  Diagnosis Date   Abnormal Pap smear    Anemia 02/21/2012   Arthritis    Asthma    Bronchitis    Diabetes mellitus    Diabetes mellitus ([place]) 02/21/2012   Fibroid    GERD (gastroesophageal reflux disease)    HTN (hypertension) 02/21/2012   Hypertension    Migraines 02/21/2012    Encounter Details:  CNP Questionnaire - 07/11/22 1116       Questionnaire   Ask client: Do you give verbal consent for me to treat you today? Yes    Student Assistance N/A    Location Patient Served  [facility]    Visit Setting with Client [facility]    Patient Status Unhoused    Insurance Medicaid    Insurance/Financial Assistance Referral N/A    Medication N/A    Medical Provider Yes    Screening Referrals Made N/A    Medical Referrals Made [facility] PCP[facility]    Medical Appointment Made N/A    Recently w/o PCP, now 1st time PCP visit completed due to CNs referral or appointment made N/A    Food Have Food Insecurities    Transportation N/A    Housing/Utilities No permanent housing    Interpersonal Safety N/A    Interventions Case Management;Counsel;Advocate/Support;Educate    Abnormal to Normal Screening Since Last CN Visit N/A    Screenings CN Performed N/A    Sent Client to Lab for: N/A    Did client attend any of the following based off CNs referral or appointments made? N/A    ED Visit Averted N/A    Life-Saving Intervention Made N/A      Questionnaire   Do you give verbal consent to treat you today? Yes    Location Patient [place] or Organization    Patient Status Homeless    Insurance [facility]    Insurance Referral N/A    Medication N/A    Intervention Case Management;Counsel;Educate            Says had a possible spell of "sleep paralysis" last night around 8 pm. Had fallen asleep then woke and for approx. 5 mins  she could not move her arms or legs. Was able to fall back to sleep but woke early am. and could not get back to sleep. Says she has already messaged her Neurologist via My Chart. Is waiting for a response. Encouraged to follow-up with MD regarding sleep issues. Says will keep nurse informed.   Volney Presser, RN, BSN

## 2022-07-11 NOTE — Congregational Nurse Program (Deleted)
  Dept: [phone]   Congregational Nurse Program Note  Date of Encounter: 07/11/2022  Past Medical History: Past Medical History:  Diagnosis Date   Abnormal Pap smear    Anemia 02/21/2012   Arthritis    Asthma    Bronchitis    Diabetes mellitus    Diabetes mellitus ([place]) 02/21/2012   Fibroid    GERD (gastroesophageal reflux disease)    HTN (hypertension) 02/21/2012   Hypertension    Migraines 02/21/2012    Encounter Details:  CNP Questionnaire - 07/11/22 1116       Questionnaire   Ask client: Do you give verbal consent for me to treat you today? Yes    Student Assistance N/A    Location Patient Served  [facility]    Visit Setting with Client [facility]    Patient Status Unhoused    Insurance [facility]    Insurance/Financial Assistance Referral N/A    Medical Provider Yes    Screening Referrals Made N/A    Medical Referrals Made [facility] PCP/[facility]    Medical Appointment Made N/A    Recently w/o PCP, now 1st time PCP visit completed due to CNs referral or appointment made N/A    Food Have Food Insecurities    Transportation N/A    Housing/Utilities No permanent housing    Interpersonal Safety N/A    Interventions Case Management;Counsel;Advocate/Support;Educate    Abnormal to Normal Screening Since Last CN Visit N/A    Screenings CN Performed N/A    Sent Client to Lab for: N/A    Did client attend any of the following based off CNs referral or appointments made? N/A    ED Visit Averted N/A    Life-Saving Intervention Made N/A      Questionnaire   Do you give verbal consent to treat you today? Yes    Location Patient [place] or Organization    Patient Status Homeless    Insurance [facility]    Insurance Referral N/A    Medication N/A    Intervention Case Management;Counsel;Educate            Reports had an episode last night around 8 pm. Of possible "sleep paralysis" a short time after falling asleep. Says she woke up and couldn't  move her arms or legs for approx. 5 mins. Fell back to sleep but then woke again early morning and couldn't get back to sleep. Says she sent a message to her Neurologist via my chart about this event. Encouraged to follow-up with MD re: sleep issues and monitor for other new symptoms. Says will keep nurse informed.   Volney Presser, RN, BSN

## 2022-07-16 NOTE — Congregational Nurse Program (Signed)
  Dept: [phone]   Congregational Nurse Program Note  Date of Encounter: 07/16/2022  Past Medical History: Past Medical History:  Diagnosis Date   Abnormal Pap smear    Anemia 02/21/2012   Arthritis    Asthma    Bronchitis    Diabetes mellitus    Diabetes mellitus ([place]) 02/21/2012   Fibroid    GERD (gastroesophageal reflux disease)    HTN (hypertension) 02/21/2012   Hypertension    Migraines 02/21/2012    Encounter Details:  CNP Questionnaire - 07/16/22 1533       Questionnaire   Ask client: Do you give verbal consent for me to treat you today? Yes    Student Assistance N/A    Location Patient Served  [facility]    Visit Setting with Client [facility]    Patient Status Unhoused    Insurance Medicaid    Insurance/Financial Assistance Referral N/A    Medication N/A    Medical Provider Yes    Screening Referrals Made N/A    Medical Referrals Made N/A    Medical Appointment Made N/A    Recently w/o PCP, now 1st time PCP visit completed due to CNs referral or appointment made N/A    Food Have Food Insecurities    Transportation N/A    Housing/Utilities No permanent housing    Interpersonal Safety N/A    Interventions Case Management;Counsel;Advocate/Support;Educate    Abnormal to Normal Screening Since Last CN Visit N/A    Screenings CN Performed N/A    Sent Client to Lab for: N/A    Did client attend any of the following based off CNs referral or appointments made? N/A    ED Visit Averted N/A    Life-Saving Intervention Made N/A      Questionnaire   Do you give verbal consent to treat you today? Yes    Location Patient [place] or Organization    Patient Status Homeless    Insurance [facility]    Insurance Referral N/A    Medication N/A    Intervention Case Management;Counsel;Educate            Had labs yesterday with [place] provider. Ferritin level is high and platelets are slightly high per client. Says has return appt. on  11/29 at 9 am. to get an infusion. Plans to ask more questions about cause of elevated Ferritin. Also, still c/o sleep problems. Says will contact her PCP to discuss.  Volney Presser, RN, BSN

## 2022-07-30 NOTE — Congregational Nurse Program (Signed)
  Dept: [phone]   Congregational Nurse Program Note  Date of Encounter: 07/30/2022  Past Medical History: Past Medical History:  Diagnosis Date   Abnormal Pap smear    Anemia 02/21/2012   Arthritis    Asthma    Bronchitis    Diabetes mellitus    Diabetes mellitus ([place]) 02/21/2012   Fibroid    GERD (gastroesophageal reflux disease)    HTN (hypertension) 02/21/2012   Hypertension    Migraines 02/21/2012    Encounter Details:  CNP Questionnaire - 07/30/22 1451       Questionnaire   Ask client: Do you give verbal consent for me to treat you today? Yes    Student Assistance N/A    Location Patient Served  [facility]    Visit Setting with Client [facility]    Patient Status Unhoused    Insurance Medicaid    Insurance/Financial Assistance Referral N/A    Medication N/A    Medical Provider Yes    Screening Referrals Made N/A    Medical Referrals Made N/A    Medical Appointment Made N/A    Recently w/o PCP, now 1st time PCP visit completed due to CNs referral or appointment made N/A    Food Have Food Insecurities    Transportation N/A    Housing/Utilities No permanent housing    Interpersonal Safety N/A    Interventions Case Management;Counsel;Advocate/Support;Educate    Abnormal to Normal Screening Since Last CN Visit N/A    Screenings CN Performed N/A    Sent Client to Lab for: N/A    Did client attend any of the following based off CNs referral or appointments made? N/A    ED Visit Averted N/A    Life-Saving Intervention Made N/A      Questionnaire   Do you give verbal consent to treat you today? Yes    Location Patient [place] or Organization    Patient Status Homeless    Insurance [facility]    Insurance Referral N/A    Medication N/A    Intervention Case Management;Counsel;Educate             Reports missed follow-up appt. this am. with Alma Friendly to go over her ;latest lab results. Says her transportation was running  late so she called ahead to the Dr's office. Was rescheduled for 09/10/22. Also, developed some hoarseness and stuffiness yest. and is a little worse this morning but, had been outside in the cold air yest. Denies fever or other symptoms. Says tested negative for COVID this am. Instructed to monitor symptoms and retest after 2 days. If symptoms worsen try OTC meds. or follow-up with PCP or Urgent care as needed.   Volney Presser, RN, BSN

## 2022-07-31 ENCOUNTER — Encounter (HOSPITAL_COMMUNITY): Payer: Self-pay | Admitting: *Deleted

## 2022-07-31 ENCOUNTER — Ambulatory Visit (HOSPITAL_COMMUNITY)
Admission: EM | Admit: 2022-07-31 | Discharge: 2022-07-31 | Disposition: A | Discharge status: Home / Self Care | Payer: Medicaid Other | Attending: Family Medicine | Admitting: Family Medicine

## 2022-07-31 ENCOUNTER — Other Ambulatory Visit: Payer: Self-pay

## 2022-07-31 DIAGNOSIS — J069 Acute upper respiratory infection, unspecified: Secondary | ICD-10-CM | POA: Diagnosis not present

## 2022-07-31 DIAGNOSIS — Z1152 Encounter for screening for COVID-19: Secondary | ICD-10-CM | POA: Diagnosis not present

## 2022-07-31 DIAGNOSIS — R058 Other specified cough: Secondary | ICD-10-CM | POA: Diagnosis not present

## 2022-07-31 DIAGNOSIS — J029 Acute pharyngitis, unspecified: Secondary | ICD-10-CM | POA: Diagnosis not present

## 2022-07-31 LAB — POCT RAPID STREP A, ED / UC: Streptococcus, Group A Screen (Direct): NEGATIVE

## 2022-07-31 LAB — RESP PANEL BY RT-PCR (FLU A&B, COVID) ARPGX2
Influenza A by PCR: NEGATIVE
Influenza B by PCR: NEGATIVE
SARS Coronavirus 2 by RT PCR: NEGATIVE

## 2022-07-31 NOTE — Discharge Instructions (Signed)
You were seen today for upper respiratory symptoms.  Your rapid strep was negative and will be sent for culture.  The flu/covid swab will be resulted later today and you will be called if positive.  I recommend tylenol or motrin to help with sore throat, as well as salt water gargles and honey.  If you are not improving then please return if needed.

## 2022-07-31 NOTE — ED Provider Notes (Signed)
[place]    CSN: [id number] Arrival date & time: 07/31/22  0911      History   Chief Complaint Chief Complaint  Patient presents with   Hoarse   Sore Throat   Cough    HPI Carla Brooks is a 51 y.o. female.   Patient is here for URI symptoms x 3 days.  Cough, sore throat, lost voice.  Runny nose, congestion.  No fevers/chills.  No wheezing or sob.  No otc meds taken.        Past Medical History:  Diagnosis Date   Abnormal Pap smear    Anemia 02/21/2012   Arthritis    Asthma    Bronchitis    Diabetes mellitus    Diabetes mellitus ([place]) 02/21/2012   Fibroid    GERD (gastroesophageal reflux disease)    HTN (hypertension) 02/21/2012   Hypertension    Migraines 02/21/2012    Patient Active Problem List   Diagnosis Date Noted   Uterine fibroids affecting pregnancy, antepartum 07/27/2012   Vaginal bleeding in pregnancy 07/27/2012   Pap smear abnormality of cervix with LGSIL 06/04/2012   GBS (group B streptococcus) UTI complicating pregnancy 89/38/1017   Diabetes mellitus, antepartum-class B 06/01/2012   Chronic hypertension with superimposed preeclampsia 06/01/2012   AMA (advanced maternal age) multigravida 35+ 06/01/2012   Unspecified high-risk pregnancy 06/01/2012   Migraines 02/21/2012   Anemia 02/21/2012   GERD (gastroesophageal reflux disease) 02/21/2012   HTN (hypertension) 02/21/2012   Diabetes mellitus ([place]) 02/21/2012   Arthritis 02/21/2012    Past Surgical History:  Procedure Laterality Date   CESAREAN SECTION N/A 10/29/2012   Procedure: CESAREAN SECTION;  Surgeon: Florian Buff, MD;  Location: [facility];  Service: Obstetrics;  Laterality: N/A;   CHOLECYSTECTOMY  09/27/2001    OB History     Gravida  3   Para  3   Term  2   Preterm  1   AB      Living  3      SAB      IAB      Ectopic      Multiple      Live Births  3            Home Medications    Prior to Admission medications   Medication Sig  Start Date End Date Taking? Authorizing Provider  aspirin-acetaminophen-caffeine (EXCEDRIN MIGRAINE) [phone] MG tablet Take 2 tablets by mouth every 6 (six) hours as needed for headache.     [provider]  busPIRone (BUSPAR) 7.5 MG tablet Take 7.5 mg by mouth 3 (three) times daily.    [provider]  clindamycin (CLEOCIN) 150 MG capsule Take 1 capsule (150 mg total) by mouth every 6 (six) hours. 04/21/17   Lacretia Leigh, MD  EPINEPHrine (EPIPEN) 0.3 mg/0.3 mL DEVI Inject 0.3 mLs (0.3 mg total) into the muscle as needed. Use as directed in the event of a severe allergic reaction 03/02/12   Lajean Saver, MD  hydrOXYzine (VISTARIL) 25 MG capsule Take 25 mg by mouth 2 (two) times daily as needed for anxiety.    [provider]  ibuprofen (ADVIL,MOTRIN) 600 MG tablet Take 1 tablet (600 mg total) by mouth every 8 (eight) hours as needed. Patient not taking: Reported on 04/21/2017 12/11/16   Jola Schmidt, MD  lamoTRIgine (LAMICTAL) 25 MG tablet Take 25 mg by mouth 2 (two) times daily.    [provider]  lisinopril (PRINIVIL,ZESTRIL) 10 MG tablet Take  1 tablet (10 mg total) by mouth daily. 04/21/17   Lacretia Leigh, MD  methocarbamol (ROBAXIN) 500 MG tablet Take 1 tablet (500 mg total) by mouth every 8 (eight) hours as needed for muscle spasms. 12/11/16   Jola Schmidt, MD  oxyCODONE-acetaminophen (PERCOCET/ROXICET) 5-325 MG tablet Take 2 tablets by mouth every 4 (four) hours as needed for severe pain. 04/21/17   Lacretia Leigh, MD  sertraline (ZOLOFT) 100 MG tablet Take 50 mg by mouth daily.    [provider]    Family History Family History  Problem Relation Age of Onset   Coronary artery disease Father    Cancer Paternal Uncle        throat   Diabetes Maternal Grandmother    Cancer Maternal Grandfather        prostate   Cancer Paternal Grandfather        lung    Social History Social History   Tobacco Use   Smoking status: Never   Smokeless  tobacco: Never  Substance Use Topics   Alcohol use: Yes    Comment: occasional   Drug use: No     Allergies   Shrimp [shellfish allergy], Hydrocodone, Penicillins, and Pork-derived products   Review of Systems Review of Systems  Constitutional:  Negative for chills and fatigue.  HENT:  Positive for congestion, rhinorrhea, sore throat and voice change.   Respiratory:  Positive for cough.   Gastrointestinal: Negative.   Genitourinary: Negative.   Musculoskeletal: Negative.   Psychiatric/Behavioral: Negative.       Physical Exam Triage Vital Signs ED Triage Vitals  Enc Vitals Group     BP 07/31/22 0938 (!) 152/87     Pulse Rate 07/31/22 0938 85     Resp 07/31/22 0938 18     Temp 07/31/22 0938 98.6 F (37 C)     Temp src --      SpO2 07/31/22 0938 97 %     Weight --      Height --      Head Circumference --      Peak Flow --      Pain Score 07/31/22 0935 4     Pain Loc --      Pain Edu? --      Excl. in [place]? --    No data found.  Updated Vital Signs BP (!) 152/87   Pulse 85   Temp 98.6 F (37 C)   Resp 18   SpO2 97%   Visual Acuity Right Eye Distance:   Left Eye Distance:   Bilateral Distance:    Right Eye Near:   Left Eye Near:    Bilateral Near:     Physical Exam Constitutional:      Appearance: She is well-developed.  HENT:     Head: Normocephalic.     Nose: Congestion present.     Mouth/Throat:     Mouth: Mucous membranes are moist.     Pharynx: No pharyngeal swelling, oropharyngeal exudate, posterior oropharyngeal erythema or uvula swelling.  Cardiovascular:     Rate and Rhythm: Normal rate and regular rhythm.     Heart sounds: Normal heart sounds.  Pulmonary:     Effort: Pulmonary effort is normal.  Musculoskeletal:     Cervical back: Normal range of motion and neck supple.  Lymphadenopathy:     Cervical: No cervical adenopathy.  Neurological:     General: No focal deficit present.     Mental Status: She is alert.  Psychiatric:  Mood and Affect: Mood normal.      UC Treatments / Results  Labs (all labs ordered are listed, but only abnormal results are displayed) Labs Reviewed  CULTURE, GROUP A STREP ([place])  RESP PANEL BY RT-PCR (FLU A&B, COVID) [id number]  POCT RAPID STREP A, ED / UC    EKG   Radiology No results found.  Procedures Procedures (including critical care time)  Medications Ordered in UC Medications - No data to display  Initial Impression / Assessment and Plan / UC Course  I have reviewed the triage vital signs and the nursing notes.  Pertinent labs & imaging results that were available during my care of the patient were reviewed by me and considered in my medical decision making (see chart for details).   Final Clinical Impressions(s) / UC Diagnoses   Final diagnoses:  Upper respiratory tract infection, unspecified type  Sore throat     Discharge Instructions      You were seen today for upper respiratory symptoms.  Your rapid strep was negative and will be sent for culture.  The flu/covid swab will be resulted later today and you will be called if positive.  I recommend tylenol or motrin to help with sore throat, as well as salt water gargles and honey.  If you are not improving then please return if needed.     ED Prescriptions   None    PDMP not reviewed this encounter.   Rondel Oh, MD 07/31/22 1035

## 2022-07-31 NOTE — ED Triage Notes (Signed)
Pt reports a cough ,sorethroat and hoarse throat for 3 days. Pt lives at [facility].

## 2022-08-01 NOTE — Congregational Nurse Program (Signed)
  Dept: [phone]   Congregational Nurse Program Note  Date of Encounter: 08/01/2022  Past Medical History: Past Medical History:  Diagnosis Date   Abnormal Pap smear    Anemia 02/21/2012   Arthritis    Asthma    Bronchitis    Diabetes mellitus    Diabetes mellitus ([place]) 02/21/2012   Fibroid    GERD (gastroesophageal reflux disease)    HTN (hypertension) 02/21/2012   Hypertension    Migraines 02/21/2012    Encounter Details:  CNP Questionnaire - 08/01/22 1050       Questionnaire   Ask client: Do you give verbal consent for me to treat you today? Yes    Student Assistance N/A    Location Patient S[facility]    Visit Setting with Client [facility]    Patient Status Unhoused    Insurance Medicaid    Insurance/Financial Assistance Referral N/A    Medication N/A    Medical Provider Yes    Screening Referrals Made N/A    Medical Referrals Made N/A    Medical Appointment Made N/A    Recently w/o PCP, now 1st time PCP visit completed due to CNs referral or appointment made N/A    Food Have Food Insecurities    Transportation N/A    Housing/Utilities No permanent housing    Interpersonal Safety N/A    Interventions Case Management;Counsel;Advocate/Support;Educate    Abnormal to Normal Screening Since Last CN Visit N/A    Screenings CN Performed N/A    Sent Client to Lab for: N/A    Did client attend any of the following based off CNs referral or appointments made? N/A    ED Visit Averted N/A    Life-Saving Intervention Made N/A      Questionnaire   Do you give verbal consent to treat you today? Yes    Location Patient [place] or Organization    Patient Status Homeless    Insurance [facility]    Insurance Referral N/A    Medication N/A    Intervention Case Management;Counsel;Educate            Says went to [facility] yesterday since was still feeling bad. C/o stuffiness, Hoarseness, and sore throat. Says her COVID, Strep  and Flu tests were all negative. Can take Tylenol or Motrin prn. Also, can take Robitussin once/day as needed.   Volney Presser, RN, BSN

## 2022-08-02 ENCOUNTER — Encounter (HOSPITAL_COMMUNITY): Payer: Self-pay | Admitting: *Deleted

## 2022-08-02 ENCOUNTER — Ambulatory Visit (HOSPITAL_COMMUNITY)
Admission: EM | Admit: 2022-08-02 | Discharge: 2022-08-02 | Disposition: A | Discharge status: Home / Self Care | Payer: Medicaid Other

## 2022-08-02 DIAGNOSIS — H1033 Unspecified acute conjunctivitis, bilateral: Secondary | ICD-10-CM | POA: Diagnosis not present

## 2022-08-02 MED ORDER — POLYMYXIN B-TRIMETHOPRIM 10000-0.1 UNIT/ML-% OP SOLN: OPHTHALMIC | 0 refills | Status: DC

## 2022-08-02 NOTE — ED Provider Notes (Signed)
[place]    CSN: [id number] Arrival date & time: 08/02/22  0809      History   Chief Complaint Chief Complaint  Patient presents with   Conjunctivitis    HPI Carla Brooks is a 51 y.o. female. Patient presents complaining of bilateral eye drainage that started this morning.  Patient reports onset of symptoms began with her eyes being crusted shut upon waking this morning. Patient reports being seen at this clinic recently for a viral illness, she states those symptoms are improving and she's getting over that cold. Patient reports redness of right eye. Patient denies any contact use.  Patient denies any changes to her vision or photophobia.  Patient denies any trauma to her eye.  She denies any recent exposure to pinkeye that she is aware of.  Patient states her daughter was seen for eye symptoms several weeks ago but was diagnosed with an allergic conjunctivitis. Patient reports that she occasionally will use reading glasses.  Patient reports that she has an ophthalmologist that she's seen in the past.  Patient has used a warm compress with no relief of symptoms.    Conjunctivitis    Past Medical History:  Diagnosis Date   Abnormal Pap smear    Anemia 02/21/2012   Arthritis    Asthma    Bronchitis    Diabetes mellitus    Diabetes mellitus ([place]) 02/21/2012   Fibroid    GERD (gastroesophageal reflux disease)    HTN (hypertension) 02/21/2012   Hypertension    Migraines 02/21/2012    Patient Active Problem List   Diagnosis Date Noted   Uterine fibroids affecting pregnancy, antepartum 07/27/2012   Vaginal bleeding in pregnancy 07/27/2012   Pap smear abnormality of cervix with LGSIL 06/04/2012   GBS (group B streptococcus) UTI complicating pregnancy 65/78/4696   Diabetes mellitus, antepartum-class B 06/01/2012   Chronic hypertension with superimposed preeclampsia 06/01/2012   AMA (advanced maternal age) multigravida 35+ 06/01/2012   Unspecified high-risk  pregnancy 06/01/2012   Migraines 02/21/2012   Anemia 02/21/2012   GERD (gastroesophageal reflux disease) 02/21/2012   HTN (hypertension) 02/21/2012   Diabetes mellitus ([place]) 02/21/2012   Arthritis 02/21/2012    Past Surgical History:  Procedure Laterality Date   CESAREAN SECTION N/A 10/29/2012   Procedure: CESAREAN SECTION;  Surgeon: Florian Buff, MD;  Location: [facility];  Service: Obstetrics;  Laterality: N/A;   CHOLECYSTECTOMY  09/27/2001    OB History     Gravida  3   Para  3   Term  2   Preterm  1   AB      Living  3      SAB      IAB      Ectopic      Multiple      Live Births  3            Home Medications    Prior to Admission medications   Medication Sig Start Date End Date Taking? Authorizing Provider  albuterol (VENTOLIN HFA) 108 (90 Base) MCG/ACT inhaler Inhale into the lungs. 12/04/20  Yes [provider]  amLODipine (NORVASC) 5 MG tablet Take 5 mg by mouth daily.   Yes [provider]  busPIRone (BUSPAR) 7.5 MG tablet Take 7.5 mg by mouth 3 (three) times daily.   Yes [provider]  clonazePAM (KLONOPIN) 1 MG tablet Take one tablet (1 mg dose) by mouth 2 (two) times a day as needed. 01/08/21  Yes [provider]  EPINEPHrine (EPIPEN) 0.3 mg/0.3 mL DEVI Inject 0.3 mLs (0.3 mg total) into the muscle as needed. Use as directed in the event of a severe allergic reaction 03/02/12  Yes Lajean Saver, MD  gabapentin (NEURONTIN) 300 MG capsule Take 600 mg by mouth 3 (three) times daily.   Yes [provider]  hydrOXYzine (VISTARIL) 25 MG capsule Take 25 mg by mouth 2 (two) times daily as needed for anxiety.   Yes [provider]  JARDIANCE 10 MG TABS tablet Take 10 mg by mouth daily.   Yes [provider]  labetalol (NORMODYNE) 300 MG tablet Take 300 mg by mouth 2 (two) times daily.   Yes [provider]  lamoTRIgine (LAMICTAL) 25 MG tablet Take 25 mg by mouth 2 (two) times daily.   Yes  [provider]  losartan (COZAAR) 25 MG tablet Take 25 mg by mouth 2 (two) times daily.   Yes [provider]  meclizine (ANTIVERT) 25 MG tablet Take 1 tablet by mouth at bedtime. 07/03/22  Yes [provider]  metFORMIN (GLUCOPHAGE) 1000 MG tablet Take 1,000 mg by mouth 2 (two) times daily.   Yes [provider]  omeprazole (PRILOSEC) 20 MG capsule Take 20 mg by mouth 2 (two) times daily.   Yes [provider]  prazosin (MINIPRESS) 1 MG capsule Take 1 mg by mouth at bedtime.   Yes [provider]  tiZANidine (ZANAFLEX) 4 MG tablet Take 4 mg by mouth every 6 (six) hours as needed.   Yes [provider]  trimethoprim-polymyxin b (POLYTRIM) ophthalmic solution Place 2 drops in each eye 4 times daily for the next 7 days 08/02/22  Yes Jenita Rayfield N, NP  VRAYLAR 1.5 MG capsule Take 1.5 mg by mouth daily.   Yes [provider]  clindamycin (CLEOCIN) 150 MG capsule Take 1 capsule (150 mg total) by mouth every 6 (six) hours. 04/21/17   Lacretia Leigh, MD  oxyCODONE-acetaminophen (PERCOCET/ROXICET) 5-325 MG tablet Take 2 tablets by mouth every 4 (four) hours as needed for severe pain. 04/21/17   Lacretia Leigh, MD  sertraline (ZOLOFT) 100 MG tablet Take 50 mg by mouth daily.    [provider]    Family History Family History  Problem Relation Age of Onset   Coronary artery disease Father    Cancer Paternal Uncle        throat   Diabetes Maternal Grandmother    Cancer Maternal Grandfather        prostate   Cancer Paternal Grandfather        lung    Social History Social History   Tobacco Use   Smoking status: Never   Smokeless tobacco: Never  Substance Use Topics   Alcohol use: Yes    Comment: occasional   Drug use: No     Allergies   Shrimp [shellfish allergy], Hydrocodone, Penicillins, and Pork-derived products   Review of Systems Review of Systems Per HPI  Physical Exam Triage Vital  Signs ED Triage Vitals  Enc Vitals Group     BP 08/02/22 0831 (!) 157/91     Pulse Rate 08/02/22 0831 89     Resp 08/02/22 0831 18     Temp 08/02/22 0831 98.2 F (36.8 C)     Temp Source 08/02/22 0831 Oral     SpO2 08/02/22 0831 96 %     Weight --      Height --      Head Circumference --  Peak Flow --      Pain Score 08/02/22 0824 0     Pain Loc --      Pain Edu? --      Excl. in [place]? --    No data found.  Updated Vital Signs BP (!) 157/91 (BP Location: Right Arm)   Pulse 89   Temp 98.2 F (36.8 C) (Oral)   Resp 18   LMP 04/02/2017 (Approximate)   SpO2 96%   Visual Acuity Right Eye Distance:   Left Eye Distance:   Bilateral Distance:    Right Eye Near:   Left Eye Near:    Bilateral Near:     Physical Exam Vitals and nursing note reviewed.  Constitutional:      Appearance: Normal appearance.  Eyes:     General: Lids are normal. Lids are everted, no foreign bodies appreciated. Vision grossly intact.        Right eye: Discharge present. No foreign body or hordeolum.        Left eye: Discharge present.No foreign body or hordeolum.     Extraocular Movements:     Right eye: Normal extraocular motion and no nystagmus.     Left eye: Normal extraocular motion and no nystagmus.     Conjunctiva/sclera:     Right eye: Right conjunctiva is injected. Chemosis and exudate present. No hemorrhage.    Left eye: Left conjunctiva is injected. No chemosis, exudate or hemorrhage. Neurological:     Mental Status: She is alert.      UC Treatments / Results  Labs (all labs ordered are listed, but only abnormal results are displayed) Labs Reviewed - No data to display  EKG   Radiology No results found.  Procedures Procedures (including critical care time)  Medications Ordered in UC Medications - No data to display  Initial Impression / Assessment and Plan / UC Course  I have reviewed the triage vital signs and the nursing notes.  Pertinent labs & imaging  results that were available during my care of the patient were reviewed by me and considered in my medical decision making (see chart for details).     Patient was evaluated for bilateral conjunctivitis.  Based on symptomology and physical assessment, conjunctivitis appears to be of bacterial etiology.  Polytrim eyedrops were prescribed and patient was educated on medication regiment.  Patient was made aware that if symptoms do not improve she will need to follow-up with ophthalmology.  Patient was made aware of red flag symptoms that would warrant an immediate follow-up with ophthalmology.  Patient declined a work note. Patient verbalized understanding of instructions.   Charting was provided using a a verbal dictation system, charting was proofread for errors, errors may occur which could change the meaning of the information charted.   Final Clinical Impressions(s) / UC Diagnoses   Final diagnoses:  Acute conjunctivitis of both eyes, unspecified acute conjunctivitis type     Discharge Instructions      Eyedrops have been sent to the pharmacy, you will place 2 drops in each eye 4 times daily for the next 7 days.  As discussed, if symptoms are not improving please follow-up with your ophthalmologist.   Please follow-up with ophthalmology immediately if you begin having trouble with your vision, sensitivity to light, pain, or any new/ concerning symptoms with your eye.      ED Prescriptions     Medication Sig Dispense Auth. Provider   trimethoprim-polymyxin b (POLYTRIM) ophthalmic solution Place 2 drops in each  eye 4 times daily for the next 7 days 10 mL Flossie Dibble, NP      PDMP not reviewed this encounter.   Flossie Dibble, NP 08/02/22 [phone]

## 2022-08-02 NOTE — ED Triage Notes (Signed)
Pt states she woke up this morning and her eyes were crusted over. She said it has yellow drainage this morning. She has been sick but isnt sure if its related.

## 2022-08-02 NOTE — Discharge Instructions (Addendum)
Eyedrops have been sent to the pharmacy, you will place 2 drops in each eye 4 times daily for the next 7 days.  As discussed, if symptoms are not improving please follow-up with your ophthalmologist.   Please follow-up with ophthalmology immediately if you begin having trouble with your vision, sensitivity to light, pain, or any new/ concerning symptoms with your eye.

## 2022-08-03 LAB — CULTURE, GROUP A STREP (THRC)

## 2022-08-06 NOTE — Congregational Nurse Program (Signed)
  Dept: [phone]   Congregational Nurse Program Note  Date of Encounter: 08/06/2022  Past Medical History: Past Medical History:  Diagnosis Date   Abnormal Pap smear    Anemia 02/21/2012   Arthritis    Asthma    Bronchitis    Diabetes mellitus    Diabetes mellitus ([place]) 02/21/2012   Fibroid    GERD (gastroesophageal reflux disease)    HTN (hypertension) 02/21/2012   Hypertension    Migraines 02/21/2012    Encounter Details:  CNP Questionnaire - 08/06/22 1452       Questionnaire   Ask client: Do you give verbal consent for me to treat you today? Yes    Student Assistance N/A    Location Patient [facility]erved  [facility]    Visit Setting with Client [facility]    Patient Status Unhoused    Insurance Medicaid    Insurance/Financial Assistance Referral N/A    Medication N/A    Medical Provider Yes    Screening Referrals Made N/A    Medical Referrals Made N/A    Medical Appointment Made N/A    Recently w/o PCP, now 1st time PCP visit completed due to CNs referral or appointment made N/A    Food Have Food Insecurities    Transportation N/A    Housing/Utilities No permanent housing    Interpersonal Safety N/A    Interventions Case Management;Counsel;Advocate/Support;Educate    Abnormal to Normal Screening Since Last CN Visit N/A    Screenings CN Performed N/A    Sent Client to Lab for: N/A    Did client attend any of the following based off CNs referral or appointments made? N/A    ED Visit Averted N/A    Life-Saving Intervention Made N/A      Questionnaire   Do you give verbal consent to treat you today? Yes    Location Patient [place] or Organization    Patient Status Homeless    Insurance [facility]    Insurance Referral N/A    Medication N/A    Intervention Case Management;Counsel;Educate             Says missed her appointment yest. for Vestibular Evaluation. She called a week in advance to set up transportation but request was not  reported to the transportation service. Is now rescheduled for Jan. Says also has a Sleep Study [place]. Was treated for the same allergy eye symptoms her daughter had about a week before. Was given eye drops which is helping. Reports still not sleeping well and did not sleep well last night but was able to nap today. Follow-up prn.  Volney Presser, RN, BSN

## 2022-08-08 NOTE — Congregational Nurse Program (Signed)
  Dept: [phone]   Congregational Nurse Program Note  Date of Encounter: 08/08/2022  Past Medical History: Past Medical History:  Diagnosis Date   Abnormal Pap smear    Anemia 02/21/2012   Arthritis    Asthma    Bronchitis    Diabetes mellitus    Diabetes mellitus ([place]) 02/21/2012   Fibroid    GERD (gastroesophageal reflux disease)    HTN (hypertension) 02/21/2012   Hypertension    Migraines 02/21/2012    Encounter Details:  CNP Questionnaire - 08/08/22 1156       Questionnaire   Ask client: Do you give verbal consent for me to treat you today? Yes    Student Assistance N/A    Location [facility] S[facility]    Visit Setting with Client [facility]    Patient Status Unhoused    Insurance Medicaid    Insurance/Financial Assistance Referral N/A    Medication N/A    Medical Provider Yes    Screening Referrals Made N/A    Medical Referrals Made Non-[facility] PCP/Clinic    Medical Appointment Made N/A    Recently w/o PCP, now 1st time PCP visit completed due to CNs referral or appointment made N/A    Food Have Food Insecurities    Transportation N/A    Housing/Utilities No permanent housing    Interpersonal Safety N/A    Interventions Case Management;Counsel;Advocate/Support;Educate    Abnormal to Normal Screening Since Last CN Visit N/A    Screenings CN Performed N/A    Sent Client to Lab for: N/A    Did client attend any of the following based off CNs referral or appointments made? N/A    ED Visit Averted N/A    Life-Saving Intervention Made N/A      Questionnaire   Do you give verbal consent to treat you today? Yes    Location Patient [place] or Organization    Patient Status Homeless    Insurance [facility]    Insurance Referral N/A    Medication N/A    Intervention Case Management;Counsel;Educate;Advocate            Says her right eye has not improved since seen on 12/1 in the ED and treated for conjunctivitis. Has been using  eye drops as prescribed for one week now. Left eye has improved but right eye is still red. C/o slight blurriness in her right eye at times. Says her BP and blood sugars have been fine. Has an Eye Dr. In [place]. Encouraged to send a message to her PCP via my chart to determine referral for evaluation locally.   Volney Presser, RN, BSN

## 2022-08-13 ENCOUNTER — Telehealth: Payer: Self-pay

## 2022-08-13 NOTE — Telephone Encounter (Signed)
PC to client. Staying at her mom's home today. Says her right eye has cleared up. Says she did have some hives at her left arm but that has cleared up too. Did not need to seek medical attention. Follow-up prn  Volney Presser, RN, BSN

## 2022-09-05 NOTE — Congregational Nurse Program (Signed)
  Dept: [phone]   Congregational Nurse Program Note  Date of Encounter: 09/05/2022  Past Medical History: Past Medical History:  Diagnosis Date   Abnormal Pap smear    Anemia 02/21/2012   Arthritis    Asthma    Bronchitis    Diabetes mellitus    Diabetes mellitus ([place]) 02/21/2012   Fibroid    GERD (gastroesophageal reflux disease)    HTN (hypertension) 02/21/2012   Hypertension    Migraines 02/21/2012    Encounter Details:  CNP Questionnaire - 09/05/22 1137       Questionnaire   Ask client: Do you give verbal consent for me to treat you today? Yes    Student Assistance N/A    Location [facility] S[facility]    Visit Setting with Client [facility]    Patient Status Unhoused    Insurance [facility]    Insurance/Financial Assistance Referral N/A    Medical Provider Yes    Screening Referrals Made N/A    Medical Referrals Made N/A    Medical Appointment Made N/A    Recently w/o PCP, now 1st time PCP visit completed due to CNs referral or appointment made N/A    Food Referred to Food Pantry    Transportation N/A    Housing/Utilities No permanent housing    Interpersonal Safety N/A    Interventions Advocate/Support;Case Management;Counsel;Educate    Abnormal to Normal Screening Since Last CN Visit N/A    Screenings CN Performed N/A    Sent Client to Lab for: N/A    Did client attend any of the following based off CNs referral or appointments made? N/A    ED Visit Averted N/A    Life-Saving Intervention Made N/A             Says appt. to follow-up recent labs is on 1/19 at 10:20 am. In [place]. Has transportation set up. [place] but says she is running a little short on food. Client is aware of the [place] [employer]. Hopes to get by there on Monday. Gave handout on other Food Pantries in the area.   Volney Presser, RN, BSN

## 2022-09-19 NOTE — Congregational Nurse Program (Signed)
  Dept: [phone]   Congregational Nurse Program Note  Date of Encounter: 09/19/2022  Past Medical History: Past Medical History:  Diagnosis Date   Abnormal Pap smear    Anemia 02/21/2012   Arthritis    Asthma    Bronchitis    Diabetes mellitus    Diabetes mellitus ([place]) 02/21/2012   Fibroid    GERD (gastroesophageal reflux disease)    HTN (hypertension) 02/21/2012   Hypertension    Migraines 02/21/2012    Encounter Details:  CNP Questionnaire - 09/19/22 1045       Questionnaire   Ask client: Do you give verbal consent for me to treat you today? Yes    Student Assistance N/A    Location [facility] S[facility]    Visit Setting with Client [facility]    Patient Status Unhoused    Insurance Medicaid    Insurance/Financial Assistance Referral N/A    Medication N/A    Medical Provider Yes    Screening Referrals Made N/A    Medical Referrals Made N/A    Medical Appointment Made N/A    Recently w/o PCP, now 1st time PCP visit completed due to CNs referral or appointment made N/A    Food N/A    Transportation Need transportation assistance    Housing/Utilities No permanent housing    Interpersonal Safety N/A    Interventions Advocate/Support;Case Management;Counsel;Educate    Abnormal to Normal Screening Since Last CN Visit N/A    Screenings CN Performed N/A    Sent Client to Lab for: N/A    Did client attend any of the following based off CNs referral or appointments made? N/A    ED Visit Averted N/A    Life-Saving Intervention Made N/A            Client says has her appt. in [place] tomorrow am. for lab work follow-up. Says called transportation and they say they don't have it scheduled but her Social Worker says it is scheduled. Transportation is to call client back to confirm. Encouraged client to call them back soon if no response. Also, has a Vestibular evaluation appt. set for 1/30. Will follow-up.  Volney Presser, RN, BSN

## 2022-09-24 NOTE — Congregational Nurse Program (Signed)
  Dept: [phone]   Congregational Nurse Program Note  Date of Encounter: 09/24/2022  Past Medical History: Past Medical History:  Diagnosis Date   Abnormal Pap smear    Anemia 02/21/2012   Arthritis    Asthma    Bronchitis    Diabetes mellitus    Diabetes mellitus ([place]) 02/21/2012   Fibroid    GERD (gastroesophageal reflux disease)    HTN (hypertension) 02/21/2012   Hypertension    Migraines 02/21/2012    Encounter Details:  CNP Questionnaire - 09/24/22 1450       Questionnaire   Ask client: Do you give verbal consent for me to treat you today? Yes    Student Assistance N/A    Location [facility]    Visit Setting with Client Organization    Patient Status Unhoused    Insurance Medicaid    Insurance/Financial Assistance Referral N/A    Medication N/A    Medical Provider Yes    Screening Referrals Made N/A    Medical Referrals Made N/A    Medical Appointment Made N/A    Recently w/o PCP, now 1st time PCP visit completed due to CNs referral or appointment made N/A    Food Have Food Insecurities    Transportation Need transportation assistance    Housing/Utilities No permanent housing    Interpersonal Safety N/A    Interventions Advocate/Support;Case Management;Counsel;Educate    Abnormal to Normal Screening Since Last CN Visit N/A    Screenings CN Performed N/A    Sent Client to Lab for: N/A    Did client attend any of the following based off CNs referral or appointments made? N/A    ED Visit Averted N/A    Life-Saving Intervention Made N/A            Spoke with client re: appt. with Dr. Alma Friendly to follow-up labs. Was told her blood work for diabetes is better so told to take 1 Metformin pill a day instead of 2. Says has lost a little weight too. Is to continue watching her diet and drinking water. Also, told to keep her appt. on 1/30 for a vestibular eval. since she is still having headaches. Transportation is already set up for that trip  to [place]. Will follow-up.  Volney Presser, RN, BSN

## 2022-10-08 NOTE — Congregational Nurse Program (Signed)
  Dept: [phone]   Congregational Nurse Program Note  Date of Encounter: 10/08/2022  Past Medical History: Past Medical History:  Diagnosis Date   Abnormal Pap smear    Anemia 02/21/2012   Arthritis    Asthma    Bronchitis    Diabetes mellitus    Diabetes mellitus ([place]) 02/21/2012   Fibroid    GERD (gastroesophageal reflux disease)    HTN (hypertension) 02/21/2012   Hypertension    Migraines 02/21/2012    Encounter Details:  CNP Questionnaire - 10/08/22 1500       Questionnaire   Ask client: Do you give verbal consent for me to treat you today? Yes    Student Assistance N/A    Location [facility]    Visit Setting with Client Organization    Patient Status Unhoused    Insurance Medicaid    Insurance/Financial Assistance Referral N/A    Medication N/A    Medical Provider Yes    Screening Referrals Made N/A    Medical Referrals Made N/A    Medical Appointment Made N/A    Recently w/o PCP, now 1st time PCP visit completed due to CNs referral or appointment made N/A    Food Have Food Insecurities    Transportation N/A    Housing/Utilities No permanent housing    Interpersonal Safety N/A    Interventions Advocate/Support;Case Management;Counsel;Educate    Abnormal to Normal Screening Since Last CN Visit N/A    Screenings CN Performed N/A    Sent Client to Lab for: N/A    Did client attend any of the following based off CNs referral or appointments made? N/A    ED Visit Averted N/A    Life-Saving Intervention Made N/A            Says went for her vestibular eval. and was told they did not find anything wrong. Was told by the Dr. to see a Neurologist due to continued episodes and seizure like symptoms. Neurology appt. moved up to 2/14. Also, has a sleep study this Fri., 2/9. Says she has arranged transportation for all appts. Will follow-up.  Volney Presser, RN. BSN

## 2022-10-15 NOTE — Congregational Nurse Program (Signed)
  Dept: [phone]   Congregational Nurse Program Note  Date of Encounter: 10/15/2022  Past Medical History: Past Medical History:  Diagnosis Date   Abnormal Pap smear    Anemia 02/21/2012   Arthritis    Asthma    Bronchitis    Diabetes mellitus    Diabetes mellitus ([place]) 02/21/2012   Fibroid    GERD (gastroesophageal reflux disease)    HTN (hypertension) 02/21/2012   Hypertension    Migraines 02/21/2012    Encounter Details:  CNP Questionnaire - 10/15/22 1530       Questionnaire   Ask client: Do you give verbal consent for me to treat you today? Yes    Student Assistance N/A    Location [facility]    Visit Setting with Client Organization    Patient Status Unhoused    Insurance Medicaid    Insurance/Financial Assistance Referral N/A    Medication N/A    Medical Provider Yes    Screening Referrals Made N/A    Medical Referrals Made N/A    Medical Appointment Made N/A    Recently w/o PCP, now 1st time PCP visit completed due to CNs referral or appointment made N/A    Food Have Food Insecurities    Transportation N/A    Housing/Utilities No permanent housing    Interpersonal Safety N/A    Interventions Advocate/Support;Case Management;Counsel;Educate    Abnormal to Normal Screening Since Last CN Visit N/A    Screenings CN Performed N/A    Sent Client to Lab for: N/A    Did client attend any of the following based off CNs referral or appointments made? N/A    ED Visit Averted N/A    Life-Saving Intervention Made N/A            Says had her sleep study on Friday. Was told she stopped breathing twice and informed it may take 2 weeks to get her final report. Will see Neurologist on Thursday. Follow-up prn.  Volney Presser, RN, BSN

## 2022-11-12 NOTE — Congregational Nurse Program (Signed)
  Dept: [phone]   Congregational Nurse Program Note  Date of Encounter: 11/12/2022  Past Medical History: Past Medical History:  Diagnosis Date   Abnormal Pap smear    Anemia 02/21/2012   Arthritis    Asthma    Bronchitis    Diabetes mellitus    Diabetes mellitus ([place]) 02/21/2012   Fibroid    GERD (gastroesophageal reflux disease)    HTN (hypertension) 02/21/2012   Hypertension    Migraines 02/21/2012    Encounter Details:  CNP Questionnaire - 11/12/22 1658       Questionnaire   Ask client: Do you give verbal consent for me to treat you today? Yes    Student Assistance N/A    Location [facility] S[facility]    Visit Setting with Client Organization    Patient Status Unhoused    Insurance Medicaid    Insurance/Financial Assistance Referral N/A    Medical Provider Yes    Screening Referrals Made N/A    Medical Referrals Made N/A    Medical Appointment Made N/A    Recently w/o PCP, now 1st time PCP visit completed due to CNs referral or appointment made N/A    Food Have Food Insecurities    Transportation N/A    Housing/Utilities No permanent housing    Interpersonal Safety N/A    Interventions Advocate/Support;Case Management;Counsel    Screenings CN Performed N/A    Sent Client to Lab for: N/A    Did client attend any of the following based off CNs referral or appointments made? N/A    ED Visit Averted N/A    Life-Saving Intervention Made N/A            Reports feeling more tired lately and thinks her Iron may be low again. Says she may need another infusion soon. Has a routine follow-up in April. Encouraged to contact Dr. If symptoms worsen. Follow-up prn.  Volney Presser, RN, BSN

## 2022-12-10 ENCOUNTER — Ambulatory Visit (HOSPITAL_COMMUNITY): Payer: Self-pay | Admitting: Licensed Clinical Social Worker

## 2022-12-10 NOTE — Congregational Nurse Program (Signed)
  Dept: [phone]   Congregational Nurse Program Note  Date of Encounter: 12/10/2022  Past Medical History: Past Medical History:  Diagnosis Date   Abnormal Pap smear    Anemia 02/21/2012   Arthritis    Asthma    Bronchitis    Diabetes mellitus    Diabetes mellitus ([facility]) 02/21/2012   Fibroid    GERD (gastroesophageal reflux disease)    HTN (hypertension) 02/21/2012   Hypertension    Migraines 02/21/2012    Encounter Details:  CNP Questionnaire - 12/10/22 1448       Questionnaire   Ask client: Do you give verbal consent for me to treat you today? Yes    Student Assistance N/A    [facility]    Visit Setting with Client Organization    Patient Status Unhoused    Insurance Medicaid    Insurance/Financial Assistance Referral N/A    Medication N/A    Medical Provider Yes    Screening Referrals Made N/A    Medical Referrals Made N/A    Medical Appointment Made N/A    Recently w/o PCP, now 1st time PCP visit completed due to CNs referral or appointment made N/A    Food Have Food Insecurities    Transportation N/A    Housing/Utilities No permanent housing    Interpersonal Safety N/A    Interventions Advocate/Support;Case Management;Counsel    Abnormal to Normal Screening Since Last CN Visit N/A    Screenings CN Performed N/A    Sent Client to Lab for: N/A    Did client attend any of the following based off CNs referral or appointments made? N/A    ED Visit Averted N/A    Life-Saving Intervention Made N/A             Client having issues with Medicaid payment for meds filled at [facility]. Says this happened once before with the same lady. Client has called her Medicaid worker and left a message. Encouraged to also call [facility] back and speak with a Pharmacist regarding situation. Co-pay should only be $4.00 per prescription.  Joya Gaskins, RN, BSN

## 2022-12-11 ENCOUNTER — Ambulatory Visit (INDEPENDENT_AMBULATORY_CARE_PROVIDER_SITE_OTHER): Payer: Medicaid Other | Admitting: Licensed Clinical Social Worker

## 2022-12-11 DIAGNOSIS — F3132 Bipolar disorder, current episode depressed, moderate: Secondary | ICD-10-CM

## 2022-12-11 DIAGNOSIS — F4310 Post-traumatic stress disorder, unspecified: Secondary | ICD-10-CM | POA: Diagnosis not present

## 2022-12-11 DIAGNOSIS — F411 Generalized anxiety disorder: Secondary | ICD-10-CM | POA: Insufficient documentation
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
## 2022-12-26 ENCOUNTER — Ambulatory Visit (INDEPENDENT_AMBULATORY_CARE_PROVIDER_SITE_OTHER): Payer: Medicaid Other | Admitting: Physician Assistant

## 2022-12-26 ENCOUNTER — Encounter (HOSPITAL_COMMUNITY): Payer: Self-pay | Admitting: Physician Assistant

## 2022-12-26 VITALS — BP 151/92 | HR 88 | Ht 67.0 in | Wt 222.2 lb

## 2022-12-26 DIAGNOSIS — F411 Generalized anxiety disorder: Secondary | ICD-10-CM

## 2022-12-26 DIAGNOSIS — F3132 Bipolar disorder, current episode depressed, moderate: Secondary | ICD-10-CM

## 2022-12-26 DIAGNOSIS — F4310 Post-traumatic stress disorder, unspecified: Secondary | ICD-10-CM

## 2022-12-26 MED ORDER — TRAZODONE HCL 50 MG PO TABS: 50.0000 mg | ORAL_TABLET | Freq: Every day | ORAL | 1 refills | Status: DC

## 2022-12-26 MED ORDER — PRAZOSIN HCL 2 MG PO CAPS: 2.0000 mg | ORAL_CAPSULE | Freq: Every day | ORAL | 1 refills | Status: DC

## 2022-12-26 MED ORDER — CARIPRAZINE HCL 3 MG PO CAPS: 3.0000 mg | ORAL_CAPSULE | Freq: Every day | ORAL | 1 refills | Status: DC

## 2022-12-26 MED ORDER — LAMOTRIGINE 100 MG PO TABS: 100.0000 mg | ORAL_TABLET | Freq: Two times a day (BID) | ORAL | 1 refills | Status: DC

## 2022-12-26 MED ORDER — BUSPIRONE HCL 15 MG PO TABS: 22.5000 mg | ORAL_TABLET | Freq: Two times a day (BID) | ORAL | 1 refills | Status: DC
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
## 2022-12-31 NOTE — Congregational Nurse Program (Signed)
  Dept: [phone]   Congregational Nurse Program Note  Date of Encounter: 12/31/2022  Past Medical History: Past Medical History:  Diagnosis Date   Abnormal Pap smear    Anemia 02/21/2012   Arthritis    Asthma    Bronchitis    Diabetes mellitus    Diabetes mellitus (HCC) 02/21/2012   Fibroid    GERD (gastroesophageal reflux disease)    HTN (hypertension) 02/21/2012   Hypertension    Migraines 02/21/2012    Encounter Details:  CNP Questionnaire - 12/31/22 1556       Questionnaire   Ask client: Do you give verbal consent for me to treat you today? Yes    Student Assistance N/A    Location Patient Served  [facility]    Visit Setting with Client Organization    Patient Status Unhoused    Insurance Medicaid    Insurance/Financial Assistance Referral N/A    Medication N/A    Medical Provider Yes    Screening Referrals Made N/A    Medical Referrals Made N/A    Medical Appointment Made N/A    Recently w/o PCP, now 1st time PCP visit completed due to CNs referral or appointment made N/A    Food Have Food Insecurities    Transportation N/A    Housing/Utilities No permanent housing    Interpersonal Safety N/A    Interventions Advocate/Support;Case Management;Counsel    Abnormal to Normal Screening Since Last CN Visit N/A    Screenings CN Performed N/A    Sent Client to Lab for: N/A    Did client attend any of the following based off CNs referral or appointments made? N/A    ED Visit Averted N/A    Life-Saving Intervention Made N/A            Spoke with client today. Says she went for her first appt. with a new Psychologist on Thurs., 4/25. Some meds. have been increased, Lamictal was decreased and Trazadone added to help with sleep. Return appt. In 6 weeks.  Joya Gaskins, RN, BSN

## 2023-01-30 NOTE — Congregational Nurse Program (Signed)
  Dept: [phone]   Congregational Nurse Program Note  Date of Encounter: 01/30/2023  Past Medical History: Past Medical History:  Diagnosis Date   Abnormal Pap smear    Anemia 02/21/2012   Arthritis    Asthma    Bronchitis    Diabetes mellitus    Diabetes mellitus (HCC) 02/21/2012   Fibroid    GERD (gastroesophageal reflux disease)    HTN (hypertension) 02/21/2012   Hypertension    Migraines 02/21/2012    Encounter Details:  CNP Questionnaire - 01/30/23 1010       Questionnaire   Ask client: Do you give verbal consent for me to treat you today? Yes    Student Assistance N/A    Location Patient Served  [facility]    Visit Setting with Client Organization    Patient Status Unhoused    Insurance Medicaid    Insurance/Financial Assistance Referral N/A    Medication N/A    Medical Provider Yes    Screening Referrals Made N/A    Medical Referrals Made N/A    Medical Appointment Made N/A    Recently w/o PCP, now 1st time PCP visit completed due to CNs referral or appointment made N/A    Food Have Food Insecurities    Transportation N/A    Housing/Utilities No permanent housing    Interpersonal Safety N/A    Interventions Advocate/Support;Case Management;Counsel    Abnormal to Normal Screening Since Last CN Visit N/A    Screenings CN Performed N/A    Sent Client to Lab for: N/A    Did client attend any of the following based off CNs referral or appointments made? N/A    ED Visit Averted N/A    Life-Saving Intervention Made N/A             Client has received notification via letter from DSS worker that she will have a psych. evaluation in July as part of her disability appeal process. Continues with medical testing and follow-up appointments as well.  Joya Gaskins, RN, BSN

## 2023-02-06 ENCOUNTER — Encounter (HOSPITAL_COMMUNITY): Payer: Self-pay | Admitting: Physician Assistant

## 2023-02-06 ENCOUNTER — Ambulatory Visit (INDEPENDENT_AMBULATORY_CARE_PROVIDER_SITE_OTHER): Payer: Medicaid Other | Admitting: Physician Assistant

## 2023-02-06 DIAGNOSIS — F411 Generalized anxiety disorder: Secondary | ICD-10-CM

## 2023-02-06 DIAGNOSIS — F4310 Post-traumatic stress disorder, unspecified: Secondary | ICD-10-CM

## 2023-02-06 DIAGNOSIS — F3132 Bipolar disorder, current episode depressed, moderate: Secondary | ICD-10-CM | POA: Diagnosis not present

## 2023-02-06 MED ORDER — PRAZOSIN HCL 2 MG PO CAPS: 2.0000 mg | ORAL_CAPSULE | Freq: Every day | ORAL | 1 refills | Status: DC

## 2023-02-06 MED ORDER — TRAZODONE HCL 50 MG PO TABS: 50.0000 mg | ORAL_TABLET | Freq: Every day | ORAL | 1 refills | Status: DC

## 2023-02-06 MED ORDER — BUSPIRONE HCL 15 MG PO TABS: 22.5000 mg | ORAL_TABLET | Freq: Two times a day (BID) | ORAL | 1 refills | Status: DC

## 2023-02-06 MED ORDER — SERTRALINE HCL 50 MG PO TABS: 50.0000 mg | ORAL_TABLET | Freq: Every day | ORAL | 1 refills | Status: DC

## 2023-02-06 MED ORDER — CARIPRAZINE HCL 3 MG PO CAPS: 3.0000 mg | ORAL_CAPSULE | Freq: Every day | ORAL | 1 refills | Status: DC

## 2023-02-06 MED ORDER — LAMOTRIGINE 100 MG PO TABS: 100.0000 mg | ORAL_TABLET | Freq: Two times a day (BID) | ORAL | 1 refills | Status: DC

## 2023-02-06 NOTE — Progress Notes (Signed)
BH MD/PA/NP OP Progress Note  02/06/2023 11:02 AM Carla Brooks  MRN:  [id number]  Chief Complaint:  Chief Complaint  Patient presents with   Follow-up   Medication Management   HPI:   Carla Brooks is a 52 year old, African-American female with a past psychiatric history significant for generalized anxiety disorder, bipolar disorder (depressed, moderate), and PTSD who presents to [facility] for follow up and medication management.  Patient is currently being managed on the following psychiatric medications:  Lamotrigine 100 mg 2 times daily Cariprazine 3 mg daily Trazodone 50 mg at bedtime Buspirone 22.5 mg 2 times daily Prazosin 2 mg at bedtime  Patient continues to endorse depressive episodes attributed to the anniversary of the many deaths in her family.  Patient rates her depression as 5 out of 10 with 10 being most severe.  Patient endorses depressive episodes most days with symptoms lasting the whole day.  Patient endorses the following depressive symptoms: difficulty getting out of bed, feelings of sadness, and feelings of grief especially towards the loss of her father whom she states she was really close to.  Patient also endorses anxiety and rates her anxiety as 3 or 4 out of 10.  A PHQ-9 screen was performed with the patient scoring a 17.  A GAD-7 screen was also performed with the patient scoring a 19.  Patient is alert and oriented x 4, calm, cooperative, and fully engaged in conversation during the encounter.  Patient endorses depressed mood.  Patient denies suicidal or homicidal ideations.  She further denies auditory or visual hallucinations and does not appear to be responding to internal/external stimuli.  Patient endorses disrupted sleep stating that she often goes to sleep at 9 PM and is up by 1:30 a.m.  Patient endorses good appetite and eats on average 3 meals a day along with a snack.  Patient denies alcohol consumption,  tobacco use, and illicit drug use.  Visit Diagnosis:    ICD-10-CM   1. GAD (generalized anxiety disorder)  F41.1 traZODone (DESYREL) 50 MG tablet    sertraline (ZOLOFT) 50 MG tablet    busPIRone (BUSPAR) 15 MG tablet    2. Bipolar 1 disorder, depressed, moderate (HCC)  F31.32 traZODone (DESYREL) 50 MG tablet    sertraline (ZOLOFT) 50 MG tablet    cariprazine (VRAYLAR) 3 MG capsule    lamoTRIgine (LAMICTAL) 100 MG tablet    3. PTSD (post-traumatic stress disorder)  F43.10 sertraline (ZOLOFT) 50 MG tablet    prazosin (MINIPRESS) 2 MG capsule      Past Psychiatric History:  Patient endorses a past psychiatric history significant for bipolar disorder (bipolar 1 versus bipolar 2), generalized anxiety disorder, depression, and PTSD   Patient denies a past history of hospitalization due to mental health   Patient denies a past history of suicide attempt   Patient denies a past history of homicide attempt  Past Medical History:  Past Medical History:  Diagnosis Date   Abnormal Pap smear    Anemia 02/21/2012   Arthritis    Asthma    Bronchitis    Diabetes mellitus    Diabetes mellitus (HCC) 02/21/2012   Fibroid    GERD (gastroesophageal reflux disease)    HTN (hypertension) 02/21/2012   Hypertension    Migraines 02/21/2012    Past Surgical History:  Procedure Laterality Date   CESAREAN SECTION N/A 10/29/2012   Procedure: CESAREAN SECTION;  Surgeon: Lazaro Arms, MD;  Location: [facility];  Service: Obstetrics;  Laterality:  N/A;   CHOLECYSTECTOMY  09/27/2001    Family Psychiatric History:  Uncle - schizophrenia, patient reports that he was taking lithium   Family history of suicide attempts: Patient denies Family history of homicide attempts: Patient denies Family history of substance abuse: Patient reports that her son uses marijuana  Family History:  Family History  Problem Relation Age of Onset   Coronary artery disease Father    Cancer Paternal Uncle        throat    Diabetes Maternal Grandmother    Cancer Maternal Grandfather        prostate   Cancer Paternal Grandfather        lung    Social History:  Social History   Socioeconomic History   Marital status: Single    Spouse name: Not on file   Number of children: Not on file   Years of education: Not on file   Highest education level: Not on file  Occupational History   Not on file  Tobacco Use   Smoking status: Never   Smokeless tobacco: Never  Substance and Sexual Activity   Alcohol use: Yes    Comment: occasional   Drug use: No   Sexual activity: Not Currently    Birth control/protection: None  Other Topics Concern   Not on file  Social History Narrative   Not on file   Social Determinants of Health   Financial Resource Strain: Low Risk  (12/11/2022)   Overall Financial Resource Strain (CARDIA)    Difficulty of Paying Living Expenses: Not hard at all  Food Insecurity: Food Insecurity Present (12/11/2022)   Hunger Vital Sign    Worried About Running Out of Food in the Last Year: Often true    Ran Out of Food in the Last Year: Often true  Transportation Needs: Unmet Transportation Needs (12/11/2022)   PRAPARE - Transportation    Lack of Transportation (Medical): Yes    Lack of Transportation (Non-Medical): Yes  Physical Activity: Inactive (12/11/2022)   Exercise Vital Sign    Days of Exercise per Week: 0 days    Minutes of Exercise per Session: 0 min  Stress: Stress Concern Present (12/11/2022)   [employer] of Occupational Health - Occupational Stress Questionnaire    Feeling of Stress : Very much  Social Connections: Socially Isolated (12/11/2022)   Social Connection and Isolation Panel [NHANES]    Frequency of Communication with Friends and Family: More than three times a week    Frequency of Social Gatherings with Friends and Family: Once a week    Attends Religious Services: Never    Database administrator or Organizations: No    Attends Banker  Meetings: Never    Marital Status: Never married    Allergies:  Allergies  Allergen Reactions   Shrimp [Shellfish Allergy] Anaphylaxis and Hives   Hydrocodone Hives   Penicillins Hives and Other (See Comments)    Has patient had a PCN reaction causing immediate rash, facial/tongue/throat swelling, SOB or lightheadedness with hypotension: No Has patient had a PCN reaction causing severe rash involving mucus membranes or skin necrosis: No Has patient had a PCN reaction that required hospitalization No Has patient had a PCN reaction occurring within the last 10 years: No If all of the above answers are "NO", then may proceed with Cephalosporin use.   Pork-Derived Products Other (See Comments)    Reaction:  Increases pts BP    Metabolic Disorder Labs: Lab Results  Component Value  Date   HGBA1C 5.8 (H) 06/01/2012   MPG 120 (H) 06/01/2012   MPG 123 (H) 02/21/2012   No results found for: "PROLACTIN" No results found for: "CHOL", "TRIG", "HDL", "CHOLHDL", "VLDL", "LDLCALC" Lab Results  Component Value Date   TSH 0.667 06/01/2012   TSH 0.469 02/21/2012    Therapeutic Level Labs: No results found for: "LITHIUM" No results found for: "VALPROATE" No results found for: "CBMZ"  Current Medications: Current Outpatient Medications  Medication Sig Dispense Refill   sertraline (ZOLOFT) 50 MG tablet Take 1 tablet (50 mg total) by mouth daily. 30 tablet 1   albuterol (VENTOLIN HFA) 108 (90 Base) MCG/ACT inhaler Inhale into the lungs.     amLODipine (NORVASC) 5 MG tablet Take 5 mg by mouth daily.     busPIRone (BUSPAR) 15 MG tablet Take 1.5 tablets (22.5 mg total) by mouth 2 (two) times daily. 90 tablet 1   cariprazine (VRAYLAR) 3 MG capsule Take 1 capsule (3 mg total) by mouth daily. 30 capsule 1   clindamycin (CLEOCIN) 150 MG capsule Take 1 capsule (150 mg total) by mouth every 6 (six) hours. 28 capsule 0   clonazePAM (KLONOPIN) 1 MG tablet Take one tablet (1 mg dose) by mouth 2 (two)  times a day as needed.     EPINEPHrine (EPIPEN) 0.3 mg/0.3 mL DEVI Inject 0.3 mLs (0.3 mg total) into the muscle as needed. Use as directed in the event of a severe allergic reaction 1 Device 3   gabapentin (NEURONTIN) 300 MG capsule Take 600 mg by mouth 3 (three) times daily.     hydrOXYzine (VISTARIL) 25 MG capsule Take 25 mg by mouth 2 (two) times daily as needed for anxiety.     JARDIANCE 10 MG TABS tablet Take 10 mg by mouth daily.     labetalol (NORMODYNE) 300 MG tablet Take 300 mg by mouth 2 (two) times daily.     lamoTRIgine (LAMICTAL) 100 MG tablet Take 1 tablet (100 mg total) by mouth 2 (two) times daily. 60 tablet 1   losartan (COZAAR) 25 MG tablet Take 25 mg by mouth 2 (two) times daily.     meclizine (ANTIVERT) 25 MG tablet Take 1 tablet by mouth at bedtime.     metFORMIN (GLUCOPHAGE) 1000 MG tablet Take 1,000 mg by mouth 2 (two) times daily.     omeprazole (PRILOSEC) 20 MG capsule Take 20 mg by mouth 2 (two) times daily.     oxyCODONE-acetaminophen (PERCOCET/ROXICET) 5-325 MG tablet Take 2 tablets by mouth every 4 (four) hours as needed for severe pain. 15 tablet 0   prazosin (MINIPRESS) 2 MG capsule Take 1 capsule (2 mg total) by mouth at bedtime. 30 capsule 1   tiZANidine (ZANAFLEX) 4 MG tablet Take 4 mg by mouth every 6 (six) hours as needed.     traZODone (DESYREL) 50 MG tablet Take 1 tablet (50 mg total) by mouth at bedtime. 30 tablet 1   trimethoprim-polymyxin b (POLYTRIM) ophthalmic solution Place 2 drops in each eye 4 times daily for the next 7 days 10 mL 0   No current facility-administered medications for this visit.     Musculoskeletal: Strength & Muscle Tone: within normal limits Gait & Station: normal Patient leans: N/A  Psychiatric Specialty Exam: Review of Systems  Psychiatric/Behavioral:  Positive for sleep disturbance. Negative for decreased concentration, dysphoric mood, hallucinations, self-injury and suicidal ideas. The patient is nervous/anxious. The  patient is not hyperactive.     Last menstrual period 04/02/2017, unknown  if currently breastfeeding.There is no height or weight on file to calculate BMI.  General Appearance: Casual  Eye Contact:  Good  Speech:  Clear and Coherent and Normal Rate  Volume:  Normal  Mood:  Anxious and Depressed  Affect:  Appropriate  Thought Process:  Coherent, Goal Directed, and Descriptions of Associations: Intact  Orientation:  Full (Time, Place, and Person)  Thought Content: WDL   Suicidal Thoughts:  No  Homicidal Thoughts:  No  Memory:  Immediate;   Good Recent;   Good Remote;   Fair  Judgement:  Good  Insight:  Good  Psychomotor Activity:  Normal  Concentration:  Concentration: Good and Attention Span: Good  Recall:  Good  Fund of Knowledge: Good  Language: Good  Akathisia:  No  Handed:  Right  AIMS (if indicated): not done  Assets:  Communication Skills Desire for Improvement Social Support  ADL's:  Intact  Cognition: WNL  Sleep:  Fair   Screenings: GAD-7    Flowsheet Row Clinical Support from 02/06/2023 in [facility] Office Visit from 12/26/2022 in [facility] Counselor from 12/11/2022 in [facility]  Total GAD-7 Score 19 19 18       PHQ2-9    Flowsheet Row Clinical Support from 02/06/2023 in [facility] Office Visit from 12/26/2022 in [facility] Counselor from 12/11/2022 in [place]  PHQ-2 Total Score 4 6 3   PHQ-9 Total Score 17 23 16       Flowsheet Row Clinical Support from 02/06/2023 in [facility] Office Visit from 12/26/2022 in [facility] from 08/02/2022 in [facility] RISK CATEGORY No Risk No Risk No Risk        Assessment and Plan:   Carla Brooks is a 52 year old, African-American female with a past  psychiatric history significant for generalized anxiety disorder, bipolar disorder (depressed, moderate), and PTSD who presents to [facility] for follow up and medication management.  Patient presents to the encounter endorsing depressive symptoms attributed to the anniversary of many deaths in her family.  She also continues to endorse some anxiety.  Provider discussed placing patient on an antidepressant to manage her depressive symptoms and anxiety.  Patient vocalized understanding.  Provider recommended placing patient on Zoloft 50 mg daily for the management of her depressive symptoms and anxiety.  Patient was agreeable to recommendation.  Patient's medication to be prescribed to pharmacy of choice.    Provider allowed for time at the end of the encounter to discuss potential adverse side effects with patient's current medication regimen.  Patient vocalized understanding.  Collaboration of Care: Collaboration of Care: Medication Management AEB patient being followed by mental health provider at this facility, Psychiatrist AEB provider managing patient's psychiatric medications, and Referral or follow-up with counselor/therapist AEB patient being seen by a licensed clinical social worker at this facility  Patient/Guardian was advised Release of Information must be obtained prior to any record release in order to collaborate their care with an outside provider. Patient/Guardian was advised if they have not already done so to contact the registration department to sign all necessary forms in order for [place] to release information regarding their care.   Consent: Patient/Guardian gives verbal consent for treatment and assignment of benefits for services provided during this visit. Patient/Guardian expressed understanding and agreed to proceed.   1.  GAD (generalized anxiety disorder)  - traZODone (DESYREL) 50 MG tablet; Take 1 tablet (50 mg total) by mouth at  bedtime.  Dispense: 30 tablet; Refill: 1 - sertraline (ZOLOFT) 50 MG tablet; Take 1 tablet (50 mg total) by mouth daily.  Dispense: 30 tablet; Refill: 1 - busPIRone (BUSPAR) 15 MG tablet; Take 1.5 tablets (22.5 mg total) by mouth 2 (two) times daily.  Dispense: 90 tablet; Refill: 1  2. Bipolar 1 disorder, depressed, moderate (HCC)  - traZODone (DESYREL) 50 MG tablet; Take 1 tablet (50 mg total) by mouth at bedtime.  Dispense: 30 tablet; Refill: 1 - sertraline (ZOLOFT) 50 MG tablet; Take 1 tablet (50 mg total) by mouth daily.  Dispense: 30 tablet; Refill: 1 - cariprazine (VRAYLAR) 3 MG capsule; Take 1 capsule (3 mg total) by mouth daily.  Dispense: 30 capsule; Refill: 1 - lamoTRIgine (LAMICTAL) 100 MG tablet; Take 1 tablet (100 mg total) by mouth 2 (two) times daily.  Dispense: 60 tablet; Refill: 1  3. PTSD (post-traumatic stress disorder)  - sertraline (ZOLOFT) 50 MG tablet; Take 1 tablet (50 mg total) by mouth daily.  Dispense: 30 tablet; Refill: 1 - prazosin (MINIPRESS) 2 MG capsule; Take 1 capsule (2 mg total) by mouth at bedtime.  Dispense: 30 capsule; Refill: 1  Patient to follow-up in 6 weeks Provider spent a total of 16 minutes with the patient/reviewing patient's chart  Meta Hatchet, PA 02/06/2023, 11:02 AM

## 2023-02-07 ENCOUNTER — Encounter (HOSPITAL_COMMUNITY): Payer: Self-pay

## 2023-02-07 ENCOUNTER — Emergency Department (HOSPITAL_COMMUNITY): Payer: Medicaid Other

## 2023-02-07 ENCOUNTER — Emergency Department (HOSPITAL_COMMUNITY)
Admission: EM | Admit: 2023-02-07 | Discharge: 2023-02-07 | Disposition: A | Discharge status: Home / Self Care | Payer: Medicaid Other | Attending: Emergency Medicine | Admitting: Emergency Medicine

## 2023-02-07 ENCOUNTER — Other Ambulatory Visit: Payer: Self-pay

## 2023-02-07 DIAGNOSIS — J45909 Unspecified asthma, uncomplicated: Secondary | ICD-10-CM | POA: Diagnosis not present

## 2023-02-07 DIAGNOSIS — R079 Chest pain, unspecified: Secondary | ICD-10-CM | POA: Diagnosis present

## 2023-02-07 DIAGNOSIS — N179 Acute kidney failure, unspecified: Secondary | ICD-10-CM | POA: Insufficient documentation

## 2023-02-07 DIAGNOSIS — E119 Type 2 diabetes mellitus without complications: Secondary | ICD-10-CM | POA: Insufficient documentation

## 2023-02-07 DIAGNOSIS — I10 Essential (primary) hypertension: Secondary | ICD-10-CM | POA: Insufficient documentation

## 2023-02-07 DIAGNOSIS — R0789 Other chest pain: Secondary | ICD-10-CM | POA: Diagnosis not present

## 2023-02-07 LAB — CBC WITH DIFFERENTIAL/PLATELET
Abs Immature Granulocytes: 0.04 10*3/uL (ref 0.00–0.07)
Basophils Absolute: 0.1 10*3/uL (ref 0.0–0.1)
Basophils Relative: 1 %
Eosinophils Absolute: 0.2 10*3/uL (ref 0.0–0.5)
Eosinophils Relative: 2 %
HCT: 33.3 % — ABNORMAL LOW (ref 36.0–46.0)
Hemoglobin: 10.8 g/dL — ABNORMAL LOW (ref 12.0–15.0)
Immature Granulocytes: 0 %
Lymphocytes Relative: 20 %
Lymphs Abs: 2 10*3/uL (ref 0.7–4.0)
MCH: 29 pg (ref 26.0–34.0)
MCHC: 32.4 g/dL (ref 30.0–36.0)
MCV: 89.3 fL (ref 80.0–100.0)
Monocytes Absolute: 0.7 10*3/uL (ref 0.1–1.0)
Monocytes Relative: 7 %
Neutro Abs: 7 10*3/uL (ref 1.7–7.7)
Neutrophils Relative %: 70 %
Platelets: 416 10*3/uL — ABNORMAL HIGH (ref 150–400)
RBC: 3.73 MIL/uL — ABNORMAL LOW (ref 3.87–5.11)
RDW: 12.8 % (ref 11.5–15.5)
WBC: 9.9 10*3/uL (ref 4.0–10.5)
nRBC: 0 % (ref 0.0–0.2)

## 2023-02-07 LAB — COMPREHENSIVE METABOLIC PANEL
ALT: 20 U/L (ref 0–44)
AST: 16 U/L (ref 15–41)
Albumin: 3.9 g/dL (ref 3.5–5.0)
Alkaline Phosphatase: 101 U/L (ref 38–126)
Anion gap: 14 (ref 5–15)
BUN: 17 mg/dL (ref 6–20)
CO2: 23 mmol/L (ref 22–32)
Calcium: 9.2 mg/dL (ref 8.9–10.3)
Chloride: 103 mmol/L (ref 98–111)
Creatinine, Ser: 1.31 mg/dL — ABNORMAL HIGH (ref 0.44–1.00)
GFR, Estimated: 50 mL/min — ABNORMAL LOW (ref >60)
Glucose, Bld: 123 mg/dL — ABNORMAL HIGH (ref 70–99)
Potassium: 3.5 mmol/L (ref 3.5–5.1)
Sodium: 140 mmol/L (ref 135–145)
Total Bilirubin: 0.2 mg/dL — ABNORMAL LOW (ref 0.3–1.2)
Total Protein: 6.8 g/dL (ref 6.5–8.1)

## 2023-02-07 LAB — TROPONIN I (HIGH SENSITIVITY)
Troponin I (High Sensitivity): 4 ng/L (ref <18)
Troponin I (High Sensitivity): 5 ng/L (ref <18)

## 2023-02-07 LAB — MAGNESIUM: Magnesium: 1.9 mg/dL (ref 1.7–2.4)

## 2023-02-07 NOTE — Discharge Instructions (Addendum)
Thank you for coming to [facility]. You were seen for chest pain. We did an exam, labs, and imaging, and these showed no acute findings.  Please follow up with your cardiologist within 1 week.   Do not hesitate to return to the ED or call 911 if you experience: -Worsening symptoms -Lightheadedness, passing out -Fevers/chills -Anything else that concerns you  

## 2023-02-07 NOTE — ED Triage Notes (Signed)
PT BIB EMS for chest pain, for about an hour, radiates up to left shoulder, EKG unremarkable.   BP 200/140 Nitro 1 SL 180/104 324 mg Aspirin SR HR 90's CBG 125 20 R AC

## 2023-02-07 NOTE — ED Provider Notes (Signed)
[place] EMERGENCY DEPARTMENT AT [facility] Provider Note   CSN: [id number] Arrival date & time: 02/07/23  1747     History {Add pertinent medical, surgical, social history, OB history to HPI:1} Chief Complaint  Patient presents with   Chest Pain    Carla Brooks is a 52 y.o. female with HTN, GERD, bipolar 1 disorder, migraines, anemia, fibroids, GAD/PTSD who presents with CP.   PT BIB EMS for central acute onset chest pain, rated 9/10 for approx 5-10 minutes, not constant and came and went during that time. Radiated up to left shoulder. No h/o similar. Right now still has some mild pain, rated 3/10. Denies SOB. Endorses some nausea earlier no vomiting. No diaphoresis. No recent illnesses, f/c, abd pain. Received 4 ASA and 1 SL nitro from EMS, didn't notice an improvement. No h/o MI. Has a h/o TIA in the past. No leg swelling, recent travel, recent hospitalizations/surgeries/immobilizations, h/o DVT/PE. Doesn't take a blood thinner or hormones.  At home had BP 214/114. Normally in low 100s for her.     EMS: BP 200/140 Nitro 1 SL 180/104 324 mg Aspirin SR HR 90's CBG 125 20 R AC      Chest Pain      Home Medications Prior to Admission medications   Medication Sig Start Date End Date Taking? Authorizing Provider  albuterol (VENTOLIN HFA) 108 (90 Base) MCG/ACT inhaler Inhale into the lungs. 12/04/20   [provider]  amLODipine (NORVASC) 5 MG tablet Take 5 mg by mouth daily.    [provider]  busPIRone (BUSPAR) 15 MG tablet Take 1.5 tablets (22.5 mg total) by mouth 2 (two) times daily. 02/06/23   Nwoko, Tommas Olp, PA  cariprazine (VRAYLAR) 3 MG capsule Take 1 capsule (3 mg total) by mouth daily. 02/06/23   Nwoko, Tommas Olp, PA  clindamycin (CLEOCIN) 150 MG capsule Take 1 capsule (150 mg total) by mouth every 6 (six) hours. 04/21/17   Lorre Nick, MD  clonazePAM (KLONOPIN) 1 MG tablet Take one tablet (1 mg dose) by mouth 2 (two) times a day as  needed. 01/08/21   [provider]  EPINEPHrine (EPIPEN) 0.3 mg/0.3 mL DEVI Inject 0.3 mLs (0.3 mg total) into the muscle as needed. Use as directed in the event of a severe allergic reaction 03/02/12   Cathren Laine, MD  gabapentin (NEURONTIN) 300 MG capsule Take 600 mg by mouth 3 (three) times daily.    [provider]  hydrOXYzine (VISTARIL) 25 MG capsule Take 25 mg by mouth 2 (two) times daily as needed for anxiety.    [provider]  JARDIANCE 10 MG TABS tablet Take 10 mg by mouth daily.    [provider]  labetalol (NORMODYNE) 300 MG tablet Take 300 mg by mouth 2 (two) times daily.    [provider]  lamoTRIgine (LAMICTAL) 100 MG tablet Take 1 tablet (100 mg total) by mouth 2 (two) times daily. 02/06/23   Nwoko, Tommas Olp, PA  losartan (COZAAR) 25 MG tablet Take 25 mg by mouth 2 (two) times daily.    [provider]  meclizine (ANTIVERT) 25 MG tablet Take 1 tablet by mouth at bedtime. 07/03/22   [provider]  metFORMIN (GLUCOPHAGE) 1000 MG tablet Take 1,000 mg by mouth 2 (two) times daily.    [provider]  omeprazole (PRILOSEC) 20 MG capsule Take 20 mg by mouth 2 (two) times daily.    [provider]  oxyCODONE-acetaminophen (PERCOCET/ROXICET) 5-325 MG tablet  Take 2 tablets by mouth every 4 (four) hours as needed for severe pain. 04/21/17   Lorre Nick, MD  prazosin (MINIPRESS) 2 MG capsule Take 1 capsule (2 mg total) by mouth at bedtime. 02/06/23   Nwoko, Tommas Olp, PA  sertraline (ZOLOFT) 50 MG tablet Take 1 tablet (50 mg total) by mouth daily. 02/06/23 02/06/24  Nwoko, Tommas Olp, PA  tiZANidine (ZANAFLEX) 4 MG tablet Take 4 mg by mouth every 6 (six) hours as needed.    [provider]  traZODone (DESYREL) 50 MG tablet Take 1 tablet (50 mg total) by mouth at bedtime. 02/06/23   Meta Hatchet, PA  trimethoprim-polymyxin b (POLYTRIM) ophthalmic solution Place 2 drops in each eye 4 times daily for the next 7  days 08/02/22   Debby Freiberg, NP      Allergies    Shrimp [shellfish allergy], Hydrocodone, Penicillins, and Pork-derived products    Review of Systems   Review of Systems  Cardiovascular:  Positive for chest pain.   Review of systems Negative for f/c.  A 10 point review of systems was performed and is negative unless otherwise reported in HPI.  Physical Exam Updated Vital Signs BP (!) 170/95   Pulse 89   Temp 98.7 F (37.1 C) (Oral)   Resp 16   LMP 04/02/2017 (Approximate)   SpO2 100%  Physical Exam General: Normal appearing female, lying in bed.  HEENT: Sclera anicteric, MMM, trachea midline.  Cardiology: RRR, no murmurs/rubs/gallops. BL radial and DP pulses equal bilaterally.  Resp: Normal respiratory rate and effort. CTAB, no wheezes, rhonchi, crackles.  Abd: Soft, non-tender, non-distended. No rebound tenderness or guarding.  GU: Deferred. MSK: No peripheral edema or signs of trauma. Extremities without deformity or TTP. No cyanosis or clubbing. Skin: warm, dry. No rashes or lesions. Back: No CVA tenderness Neuro: A&Ox4, CNs II-XII grossly intact. MAEs. Sensation grossly intact.  Psych: Normal mood and affect.   ED Results / Procedures / Treatments   Labs (all labs ordered are listed, but only abnormal results are displayed) Labs Reviewed  CBC WITH DIFFERENTIAL/PLATELET - Abnormal; Notable for the following components:      Result Value   RBC 3.73 (*)    Hemoglobin 10.8 (*)    HCT 33.3 (*)    Platelets 416 (*)    All other components within normal limits  COMPREHENSIVE METABOLIC PANEL - Abnormal; Notable for the following components:   Glucose, Bld 123 (*)    Creatinine, Ser 1.31 (*)    Total Bilirubin 0.2 (*)    GFR, Estimated 50 (*)    All other components within normal limits  MAGNESIUM  TROPONIN I (HIGH SENSITIVITY)  TROPONIN I (HIGH SENSITIVITY)    EKG None  Radiology DG Chest 2 View  Result Date: 02/07/2023 CLINICAL DATA:  Chest pain.  EXAM: CHEST - 2 VIEW COMPARISON:  December 10, 2016 FINDINGS: The heart size and mediastinal contours are within normal limits. Both lungs are clear. Radiopaque surgical clips are seen overlying the abdomen on the lateral view. The visualized skeletal structures are unremarkable. IMPRESSION: No active cardiopulmonary disease. Electronically Signed   By: Aram Candela M.D.   On: 02/07/2023 18:52    Procedures Procedures  {Document cardiac monitor, telemetry assessment procedure when appropriate:1}  Medications Ordered in ED Medications - No data to display  ED Course/ Medical Decision Making/ A&P  Medical Decision Making Amount and/or Complexity of Data Reviewed Labs: ordered. Decision-making details documented in ED Course. Radiology: ordered. Decision-making details documented in ED Course.    This patient presents to the ED for concern of ***, this involves an extensive number of treatment options, and is a complaint that carries with it a high risk of complications and morbidity.  I considered the following differential and admission for this acute, potentially life threatening condition.   MDM:    DDX for chest pain includes but is not limited to:  ACS/arrhythmia,  PE, aortic dissection, PNA, PTX, esophogeal rupture, biliary disease, cardiac tamponade, pericarditis, GERD/PUD/gastritis, or musculoskeletal pain. Very low suspicion for ACS vs aortic dissection given presenting sx. Patient cannot PERC out based on tachycardia, but will obtain D dimer and reassess, minimal risk factors for PE. No c/f dissection. No abdominal pain and no c/f biliary disease. Consider GERD, given known history.    Clinical Course as of 02/07/23 2052  Fri Feb 07, 2023  2019 WBC: 9.9 No leukocytosis  [HN]  2019 Hemoglobin(!): 10.8 [HN]  2019 DG Chest 2 View FINDINGS: The heart size and mediastinal contours are within normal limits. Both lungs are clear. Radiopaque surgical  clips are seen overlying the abdomen on the lateral view. The visualized skeletal structures are unremarkable.  IMPRESSION: No active cardiopulmonary disease.   [HN]  2040 Troponin I (High Sensitivity): 4 [HN]  2040 Creatinine(!): 1.31 +AKI, BL ~0.8-1.0 [HN]    Clinical Course User Index [HN] Loetta Rough, MD    Labs: I Ordered, and personally interpreted labs.  The pertinent results include:  ***  Imaging Studies ordered: I ordered imaging studies including *** I independently visualized and interpreted imaging. I agree with the radiologist interpretation  Additional history obtained from ***.  External records from outside source obtained and reviewed including ***  Cardiac Monitoring: The patient was maintained on a cardiac monitor.  I personally viewed and interpreted the cardiac monitored which showed an underlying rhythm of: ***  Reevaluation: After the interventions noted above, I reevaluated the patient and found that they have :{resolved/improved/worsened:23923::"improved"}  Social Determinants of Health: ***  Disposition:  ***  Co morbidities that complicate the patient evaluation  Past Medical History:  Diagnosis Date   Abnormal Pap smear    Anemia 02/21/2012   Arthritis    Asthma    Bronchitis    Diabetes mellitus    Diabetes mellitus (HCC) 02/21/2012   Fibroid    GERD (gastroesophageal reflux disease)    HTN (hypertension) 02/21/2012   Hypertension    Migraines 02/21/2012     Medicines No orders of the defined types were placed in this encounter.   I have reviewed the patients home medicines and have made adjustments as needed  Problem List / ED Course: Problem List Items Addressed This Visit   None        {Document critical care time when appropriate:1} {Document review of labs and clinical decision tools ie heart score, Chads2Vasc2 etc:1}  {Document your independent review of radiology images, and any outside records:1} {Document  your discussion with family members, caretakers, and with consultants:1} {Document social determinants of health affecting pt's care:1} {Document your decision making why or why not admission, treatments were needed:1}  This note was created using dictation software, which may contain spelling or grammatical errors.

## 2023-02-11 NOTE — Congregational Nurse Program (Signed)
  Dept: [phone]   Congregational Nurse Program Note  Date of Encounter: 02/11/2023  Past Medical History: Past Medical History:  Diagnosis Date   Abnormal Pap smear    Anemia 02/21/2012   Arthritis    Asthma    Bronchitis    Diabetes mellitus    Diabetes mellitus (HCC) 02/21/2012   Fibroid    GERD (gastroesophageal reflux disease)    HTN (hypertension) 02/21/2012   Hypertension    Migraines 02/21/2012    Encounter Details:  CNP Questionnaire - 02/11/23 1650       Questionnaire   Ask client: Do you give verbal consent for me to treat you today? Yes    Student Assistance N/A    Location Patient Served  [facility]    Visit Setting with Client Organization    Patient Status Unhoused    Insurance Medicaid    Insurance/Financial Assistance Referral N/A    Medication N/A    Medical Provider Yes    Screening Referrals Made N/A    Medical Referrals Made N/A    Medical Appointment Made N/A    Recently w/o PCP, now 1st time PCP visit completed due to CNs referral or appointment made N/A    Food Have Food Insecurities    Transportation N/A    Housing/Utilities No permanent housing    Interpersonal Safety N/A    Interventions Advocate/Support;Case Management;Counsel;Educate    Abnormal to Normal Screening Since Last CN Visit Blood Pressure    Screenings CN Performed N/A    Sent Client to Lab for: N/A    Did client attend any of the following based off CNs referral or appointments made? N/A    ED Visit Averted N/A    Life-Saving Intervention Made N/A            Spoke with client. Says she was seen in the ER at [facility] on Friday, 6/7 pm. Says her BP was very high and she had chest pain and pain down her left arm (see notes from ER visit). Was taking BP med. regularly. BP now back within normal range. Has her own BP machine to use daily. Says will call her Cardiologist office for a follow-up appointment.   Joya Gaskins, RN, BSN

## 2023-03-04 NOTE — Congregational Nurse Program (Signed)
  Dept: [phone]   Congregational Nurse Program Note  Date of Encounter: 03/04/2023  Past Medical History: Past Medical History:  Diagnosis Date   Abnormal Pap smear    Anemia 02/21/2012   Arthritis    Asthma    Bronchitis    Diabetes mellitus    Diabetes mellitus (HCC) 02/21/2012   Fibroid    GERD (gastroesophageal reflux disease)    HTN (hypertension) 02/21/2012   Hypertension    Migraines 02/21/2012    Encounter Details:  CNP Questionnaire - 03/04/23 1505       Questionnaire   Ask client: Do you give verbal consent for me to treat you today? Yes    Student Assistance N/A    Location Patient Served  [facility]    Visit Setting with Client Organization    Patient Status Unhoused    Insurance Medicaid    Insurance/Financial Assistance Referral N/A    Medication N/A    Medical Provider Yes    Screening Referrals Made N/A    Medical Referrals Made N/A    Medical Appointment Made N/A    Recently w/o PCP, now 1st time PCP visit completed due to CNs referral or appointment made N/A    Food Have Food Insecurities    Transportation N/A    Housing/Utilities No permanent housing    Interpersonal Safety N/A    Interventions Advocate/Support;Case Management;Counsel;Educate    Abnormal to Normal Screening Since Last CN Visit Blood Pressure    Screenings CN Performed N/A    Sent Client to Lab for: N/A    Did client attend any of the following based off CNs referral or appointments made? N/A    ED Visit Averted N/A    Life-Saving Intervention Made N/A             Client says she had a Psychological evaluation today. Results will be sent to her DSS Disability Case Worker. Also, had recent labs which indicate a problem with her platelets as well as worsening of her kidney function. Was referred to see her blood specialist as well as her kidney specialist. Dr. instructed her to increase water intake. Says BP's are running okay. Still has not heard back from her Cardiologist  re: event that occurred on 6/7 with chest pain and pain down her left arm. No new episodes. Cardiac monitor is not currently connecting properly. Says she has a phone # to call for trouble shooting help. Says will call today. Follow-up prn.  Joya Gaskins, RN, BSN

## 2023-03-17 ENCOUNTER — Encounter (HOSPITAL_COMMUNITY): Payer: Self-pay | Admitting: *Deleted

## 2023-03-17 ENCOUNTER — Other Ambulatory Visit: Payer: Self-pay

## 2023-03-17 ENCOUNTER — Emergency Department (HOSPITAL_COMMUNITY): Payer: MEDICAID

## 2023-03-17 ENCOUNTER — Observation Stay (HOSPITAL_COMMUNITY)
Admission: EM | Admit: 2023-03-17 | Discharge: 2023-03-18 | Disposition: A | Discharge status: Home / Self Care | Payer: MEDICAID | Attending: Family Medicine | Admitting: Family Medicine

## 2023-03-17 DIAGNOSIS — Z7984 Long term (current) use of oral hypoglycemic drugs: Secondary | ICD-10-CM | POA: Insufficient documentation

## 2023-03-17 DIAGNOSIS — N179 Acute kidney failure, unspecified: Secondary | ICD-10-CM

## 2023-03-17 DIAGNOSIS — E1122 Type 2 diabetes mellitus with diabetic chronic kidney disease: Secondary | ICD-10-CM | POA: Diagnosis not present

## 2023-03-17 DIAGNOSIS — R112 Nausea with vomiting, unspecified: Secondary | ICD-10-CM | POA: Diagnosis not present

## 2023-03-17 DIAGNOSIS — Z79899 Other long term (current) drug therapy: Secondary | ICD-10-CM | POA: Insufficient documentation

## 2023-03-17 DIAGNOSIS — N183 Chronic kidney disease, stage 3 unspecified: Secondary | ICD-10-CM | POA: Insufficient documentation

## 2023-03-17 DIAGNOSIS — E876 Hypokalemia: Secondary | ICD-10-CM | POA: Diagnosis not present

## 2023-03-17 DIAGNOSIS — E119 Type 2 diabetes mellitus without complications: Secondary | ICD-10-CM

## 2023-03-17 DIAGNOSIS — I129 Hypertensive chronic kidney disease with stage 1 through stage 4 chronic kidney disease, or unspecified chronic kidney disease: Secondary | ICD-10-CM | POA: Diagnosis not present

## 2023-03-17 DIAGNOSIS — I10 Essential (primary) hypertension: Secondary | ICD-10-CM | POA: Diagnosis present

## 2023-03-17 LAB — COMPREHENSIVE METABOLIC PANEL
ALT: 131 U/L — ABNORMAL HIGH (ref 0–44)
AST: 57 U/L — ABNORMAL HIGH (ref 15–41)
Albumin: 4.2 g/dL (ref 3.5–5.0)
Alkaline Phosphatase: 121 U/L (ref 38–126)
Anion gap: 14 (ref 5–15)
BUN: 27 mg/dL — ABNORMAL HIGH (ref 6–20)
CO2: 20 mmol/L — ABNORMAL LOW (ref 22–32)
Calcium: 9.4 mg/dL (ref 8.9–10.3)
Chloride: 104 mmol/L (ref 98–111)
Creatinine, Ser: 2.44 mg/dL — ABNORMAL HIGH (ref 0.44–1.00)
GFR, Estimated: 24 mL/min — ABNORMAL LOW (ref >60)
Glucose, Bld: 142 mg/dL — ABNORMAL HIGH (ref 70–99)
Potassium: 2.6 mmol/L — CL (ref 3.5–5.1)
Sodium: 138 mmol/L (ref 135–145)
Total Bilirubin: 0.8 mg/dL (ref 0.3–1.2)
Total Protein: 7.7 g/dL (ref 6.5–8.1)

## 2023-03-17 LAB — CBC
HCT: 37.2 % (ref 36.0–46.0)
Hemoglobin: 12.6 g/dL (ref 12.0–15.0)
MCH: 30.1 pg (ref 26.0–34.0)
MCHC: 33.9 g/dL (ref 30.0–36.0)
MCV: 89 fL (ref 80.0–100.0)
Platelets: 496 10*3/uL — ABNORMAL HIGH (ref 150–400)
RBC: 4.18 MIL/uL (ref 3.87–5.11)
RDW: 13.6 % (ref 11.5–15.5)
WBC: 12.6 10*3/uL — ABNORMAL HIGH (ref 4.0–10.5)
nRBC: 0 % (ref 0.0–0.2)

## 2023-03-17 LAB — BLOOD GAS, VENOUS
Acid-base deficit: 6.3 mmol/L — ABNORMAL HIGH (ref 0.0–2.0)
Bicarbonate: 17.9 mmol/L — ABNORMAL LOW (ref 20.0–28.0)
Drawn by: 5287
O2 Saturation: 96.7 %
Patient temperature: 36.5
pCO2, Ven: 30 mmHg — ABNORMAL LOW (ref 44–60)
pH, Ven: 7.38 (ref 7.25–7.43)
pO2, Ven: 73 mmHg — ABNORMAL HIGH (ref 32–45)

## 2023-03-17 LAB — HIV ANTIBODY (ROUTINE TESTING W REFLEX): HIV Screen 4th Generation wRfx: NONREACTIVE

## 2023-03-17 LAB — HEPATITIS PANEL, ACUTE
HCV Ab: NONREACTIVE
Hep A IgM: NONREACTIVE
Hep B C IgM: NONREACTIVE
Hepatitis B Surface Ag: NONREACTIVE

## 2023-03-17 LAB — HEMOGLOBIN A1C
Hgb A1c MFr Bld: 5.9 % — ABNORMAL HIGH (ref 4.8–5.6)
Mean Plasma Glucose: 122.63 mg/dL

## 2023-03-17 LAB — TSH: TSH: 0.745 u[IU]/mL (ref 0.350–4.500)

## 2023-03-17 LAB — LIPASE, BLOOD: Lipase: 44 U/L (ref 11–51)

## 2023-03-17 LAB — BETA-HYDROXYBUTYRIC ACID: Beta-Hydroxybutyric Acid: 1.61 mmol/L — ABNORMAL HIGH (ref 0.05–0.27)

## 2023-03-17 LAB — MAGNESIUM: Magnesium: 1.7 mg/dL (ref 1.7–2.4)

## 2023-03-17 LAB — POTASSIUM: Potassium: 2.8 mmol/L — ABNORMAL LOW (ref 3.5–5.1)

## 2023-03-17 MED ORDER — ACETAMINOPHEN 325 MG PO TABS: 650.0000 mg | ORAL_TABLET | Freq: Four times a day (QID) | ORAL | Status: DC | PRN

## 2023-03-17 MED ORDER — LABETALOL HCL 200 MG PO TABS
300.0000 mg | ORAL_TABLET | Freq: Two times a day (BID) | ORAL | Status: DC
  Administered 2023-03-17 – 2023-03-18 (×2): 300 mg via ORAL
  Filled 2023-03-17 (×2): qty 2

## 2023-03-17 MED ORDER — MAGNESIUM SULFATE 2 GM/50ML IV SOLN
2.0000 g | Freq: Once | INTRAVENOUS | Status: AC
  Administered 2023-03-17: 2 g via INTRAVENOUS
  Filled 2023-03-17: qty 50

## 2023-03-17 MED ORDER — LAMOTRIGINE 25 MG PO TABS
100.0000 mg | ORAL_TABLET | Freq: Two times a day (BID) | ORAL | Status: DC
  Administered 2023-03-17 – 2023-03-18 (×2): 100 mg via ORAL
  Filled 2023-03-17 (×2): qty 4

## 2023-03-17 MED ORDER — ACETAMINOPHEN 650 MG RE SUPP: 650.0000 mg | Freq: Four times a day (QID) | RECTAL | Status: DC | PRN

## 2023-03-17 MED ORDER — PRAZOSIN HCL 2 MG PO CAPS
2.0000 mg | ORAL_CAPSULE | Freq: Every day | ORAL | Status: DC
  Administered 2023-03-17: 2 mg via ORAL
  Filled 2023-03-17 (×2): qty 1

## 2023-03-17 MED ORDER — AMLODIPINE BESYLATE 5 MG PO TABS
5.0000 mg | ORAL_TABLET | Freq: Every day | ORAL | Status: DC
  Administered 2023-03-17 – 2023-03-18 (×2): 5 mg via ORAL
  Filled 2023-03-17 (×2): qty 1

## 2023-03-17 MED ORDER — POTASSIUM CHLORIDE 10 MEQ/100ML IV SOLN
10.0000 meq | INTRAVENOUS | Status: AC
  Administered 2023-03-17 (×2): 10 meq via INTRAVENOUS
  Filled 2023-03-17 (×2): qty 100

## 2023-03-17 MED ORDER — GABAPENTIN 300 MG PO CAPS
300.0000 mg | ORAL_CAPSULE | Freq: Three times a day (TID) | ORAL | Status: DC
  Administered 2023-03-17 – 2023-03-18 (×3): 300 mg via ORAL
  Filled 2023-03-17 (×3): qty 1

## 2023-03-17 MED ORDER — BUSPIRONE HCL 15 MG PO TABS
22.5000 mg | ORAL_TABLET | Freq: Two times a day (BID) | ORAL | Status: DC
  Administered 2023-03-18: 22.5 mg via ORAL
  Filled 2023-03-17: qty 2
  Filled 2023-03-17 (×2): qty 5
  Filled 2023-03-17 (×2): qty 2

## 2023-03-17 MED ORDER — ONDANSETRON HCL 4 MG/2ML IJ SOLN
4.0000 mg | Freq: Four times a day (QID) | INTRAMUSCULAR | Status: DC | PRN
  Administered 2023-03-17 – 2023-03-18 (×2): 4 mg via INTRAVENOUS
  Filled 2023-03-17 (×2): qty 2

## 2023-03-17 MED ORDER — ENSURE ENLIVE PO LIQD
237.0000 mL | Freq: Two times a day (BID) | ORAL | Status: DC
  Administered 2023-03-18: 237 mL via ORAL

## 2023-03-17 MED ORDER — SERTRALINE HCL 50 MG PO TABS: 50.0000 mg | ORAL_TABLET | Freq: Every day | ORAL | Status: DC

## 2023-03-17 MED ORDER — POTASSIUM CHLORIDE CRYS ER 20 MEQ PO TBCR
40.0000 meq | EXTENDED_RELEASE_TABLET | Freq: Once | ORAL | Status: AC
  Administered 2023-03-17: 40 meq via ORAL
  Filled 2023-03-17: qty 2

## 2023-03-17 MED ORDER — LACTATED RINGERS IV SOLN: INTRAVENOUS | Status: DC

## 2023-03-17 MED ORDER — CARIPRAZINE HCL 1.5 MG PO CAPS
3.0000 mg | ORAL_CAPSULE | Freq: Every day | ORAL | Status: DC
  Administered 2023-03-17 – 2023-03-18 (×2): 3 mg via ORAL
  Filled 2023-03-17 (×2): qty 2
  Filled 2023-03-17: qty 1

## 2023-03-17 MED ORDER — LACTATED RINGERS IV BOLUS
1000.0000 mL | Freq: Once | INTRAVENOUS | Status: AC
  Administered 2023-03-17: 1000 mL via INTRAVENOUS

## 2023-03-17 MED ORDER — CLONAZEPAM 0.5 MG PO TABS: 1.0000 mg | ORAL_TABLET | Freq: Two times a day (BID) | ORAL | Status: DC | PRN

## 2023-03-17 MED ORDER — FENTANYL CITRATE PF 50 MCG/ML IJ SOSY: 50.0000 ug | PREFILLED_SYRINGE | INTRAMUSCULAR | Status: DC | PRN

## 2023-03-17 MED ORDER — HYDROXYZINE HCL 25 MG PO TABS: 25.0000 mg | ORAL_TABLET | Freq: Two times a day (BID) | ORAL | Status: DC | PRN

## 2023-03-17 NOTE — Assessment & Plan Note (Addendum)
In ED, K+ found to be 2.6. ED repleted 10 mEq x2. EKG in ED looked normal.   - repeat EKG - repleted 40 mEq  - repeat AM CMP

## 2023-03-17 NOTE — ED Triage Notes (Signed)
Patient presents to ed via [facility] from [facility] c/o n/v/d with abd. Pain onset 4 days ago  c/o dizziness when standing.

## 2023-03-17 NOTE — ED Notes (Signed)
EDP notified of critical lab value.

## 2023-03-17 NOTE — Assessment & Plan Note (Signed)
Patient with hx of CKD III. Cr 2.4 up from baseline around 1.0. Likely in the setting of dehydration. S/P 1L bolus - continue mIVF  - AM BMP

## 2023-03-17 NOTE — ED Notes (Signed)
Got patient undressed into a gown on the monitor did EKG shown to er provider patient is resting with call bell in reach an d family at bedside

## 2023-03-17 NOTE — ED Provider Notes (Signed)
[place] EMERGENCY DEPARTMENT AT [facility] Provider Note   CSN: [id number] Arrival date & time: 03/17/23  0815     History  Chief Complaint  Patient presents with   Abdominal Pain   Emesis   Nausea   Diarrhea    Carla Brooks is a 52 y.o. female.  HPI     52 year old patient comes in with chief complaint of abdominal pain, nausea, vomiting, diarrhea.  Patient has history of diabetes, chronic hypertension.  She reports that she has been having nausea, vomiting, diarrhea and abdominal discomfort for the last 3 to 4 days.  Now she is having dizziness.  She has not been able to keep significant amount of food or fluid down.  [facility]  Patient denies any UTI-like symptoms.  She does not think she is passing flatus.  She has surgical history of cholecystectomy. Denies gastroparesis. No uri symptoms.    Home Medications Prior to Admission medications   Medication Sig Start Date End Date Taking? Authorizing Provider  albuterol (VENTOLIN HFA) 108 (90 Base) MCG/ACT inhaler Inhale into the lungs. 12/04/20   [provider]  amLODipine (NORVASC) 5 MG tablet Take 5 mg by mouth daily.    [provider]  busPIRone (BUSPAR) 15 MG tablet Take 1.5 tablets (22.5 mg total) by mouth 2 (two) times daily. 02/06/23   Nwoko, Tommas Olp, PA  cariprazine (VRAYLAR) 3 MG capsule Take 1 capsule (3 mg total) by mouth daily. 02/06/23   Nwoko, Tommas Olp, PA  clindamycin (CLEOCIN) 150 MG capsule Take 1 capsule (150 mg total) by mouth every 6 (six) hours. 04/21/17   Lorre Nick, MD  clonazePAM (KLONOPIN) 1 MG tablet Take one tablet (1 mg dose) by mouth 2 (two) times a day as needed. 01/08/21   [provider]  EPINEPHrine (EPIPEN) 0.3 mg/0.3 mL DEVI Inject 0.3 mLs (0.3 mg total) into the muscle as needed. Use as directed in the event of a severe allergic reaction 03/02/12   Cathren Laine, MD  gabapentin (NEURONTIN) 300 MG capsule Take 600 mg  by mouth 3 (three) times daily.    [provider]  hydrOXYzine (VISTARIL) 25 MG capsule Take 25 mg by mouth 2 (two) times daily as needed for anxiety.    [provider]  JARDIANCE 10 MG TABS tablet Take 10 mg by mouth daily.    [provider]  labetalol (NORMODYNE) 300 MG tablet Take 300 mg by mouth 2 (two) times daily.    [provider]  lamoTRIgine (LAMICTAL) 100 MG tablet Take 1 tablet (100 mg total) by mouth 2 (two) times daily. 02/06/23   Nwoko, Tommas Olp, PA  losartan (COZAAR) 25 MG tablet Take 25 mg by mouth 2 (two) times daily.    [provider]  meclizine (ANTIVERT) 25 MG tablet Take 1 tablet by mouth at bedtime. 07/03/22   [provider]  metFORMIN (GLUCOPHAGE) 1000 MG tablet Take 1,000 mg by mouth 2 (two) times daily.    [provider]  omeprazole (PRILOSEC) 20 MG capsule Take 20 mg by mouth 2 (two) times daily.    [provider]  oxyCODONE-acetaminophen (PERCOCET/ROXICET) 5-325 MG tablet Take 2 tablets by mouth every 4 (four) hours as needed for severe pain. 04/21/17   Lorre Nick, MD  prazosin (MINIPRESS) 2 MG capsule Take 1 capsule (2 mg total) by mouth at bedtime. 02/06/23   Nwoko, Tommas Olp, PA  sertraline (ZOLOFT) 50 MG tablet Take 1 tablet (  50 mg total) by mouth daily. 02/06/23 02/06/24  Nwoko, Tommas Olp, PA  tiZANidine (ZANAFLEX) 4 MG tablet Take 4 mg by mouth every 6 (six) hours as needed.    [provider]  traZODone (DESYREL) 50 MG tablet Take 1 tablet (50 mg total) by mouth at bedtime. 02/06/23   Meta Hatchet, PA  trimethoprim-polymyxin b (POLYTRIM) ophthalmic solution Place 2 drops in each eye 4 times daily for the next 7 days 08/02/22   Debby Freiberg, NP      Allergies    Shrimp [shellfish allergy], Hydrocodone, Penicillins, and Pork-derived products    Review of Systems   Review of Systems  All other systems reviewed and are negative.   Physical Exam Updated Vital Signs BP  (!) 146/77   Pulse 88   Temp 97.9 F (36.6 C) (Oral)   Resp 16   Ht 5\' 7"  (1.702 m)   Wt 99.8 kg   LMP 04/02/2017 (Approximate)   SpO2 100%   BMI 34.46 kg/m  Physical Exam Vitals and nursing note reviewed.  Constitutional:      Appearance: She is well-developed.  HENT:     Head: Atraumatic.  Cardiovascular:     Rate and Rhythm: Normal rate.  Pulmonary:     Effort: Pulmonary effort is normal.  Abdominal:     Palpations: Abdomen is soft.     Tenderness: There is generalized abdominal tenderness. There is no guarding or rebound.     Comments: Patient has tenderness over the lower abdominal quadrant, worse on the left side compared to right  Musculoskeletal:     Cervical back: Normal range of motion and neck supple.  Skin:    General: Skin is warm and dry.  Neurological:     Mental Status: She is alert and oriented to person, place, and time.     ED Results / Procedures / Treatments   Labs (all labs ordered are listed, but only abnormal results are displayed) Labs Reviewed  COMPREHENSIVE METABOLIC PANEL - Abnormal; Notable for the following components:      Result Value   Potassium 2.6 (*)    CO2 20 (*)    Glucose, Bld 142 (*)    BUN 27 (*)    Creatinine, Ser 2.44 (*)    AST 57 (*)    ALT 131 (*)    GFR, Estimated 24 (*)    All other components within normal limits  CBC - Abnormal; Notable for the following components:   WBC 12.6 (*)    Platelets 496 (*)    All other components within normal limits  LIPASE, BLOOD  MAGNESIUM  URINALYSIS, ROUTINE W REFLEX MICROSCOPIC    EKG EKG Interpretation Date/Time:  Monday March 17 2023 08:22:16 EDT Ventricular Rate:  83 PR Interval:  153 QRS Duration:  128 QT Interval:  413 QTC Calculation: 486 R Axis:   26  Text Interpretation: Sinus rhythm Probable left ventricular hypertrophy Borderline prolonged QT interval No acute changes No significant change since last tracing Confirmed by Derwood Kaplan [phone]) on 03/17/2023  9:03:58 AM  Radiology CT ABDOMEN PELVIS WO CONTRAST  Result Date: 03/17/2023 CLINICAL DATA:  Nonlocalized abdominal pain EXAM: CT ABDOMEN AND PELVIS WITHOUT CONTRAST TECHNIQUE: Multidetector CT imaging of the abdomen and pelvis was performed following the standard protocol without IV contrast. RADIATION DOSE REDUCTION: This exam was performed according to the departmental dose-optimization program which includes automated exposure control, adjustment of the mA and/or kV according to patient size and/or use  of iterative reconstruction technique. COMPARISON:  02/23/2012 FINDINGS: Lower chest: Unremarkable. Hepatobiliary: No suspicious focal abnormality in the liver on this study without intravenous contrast. Gallbladder is surgically absent. No intrahepatic or extrahepatic biliary dilation. Pancreas: No focal mass lesion. No dilatation of the main duct. No intraparenchymal cyst. No peripancreatic edema. Spleen: No splenomegaly. No suspicious focal mass lesion. Adrenals/Urinary Tract: No adrenal nodule or mass. Kidneys unremarkable. No evidence for hydroureter. The urinary bladder appears normal for the degree of distention. Stomach/Bowel: Stomach is unremarkable. No gastric wall thickening. No evidence of outlet obstruction. Duodenum is normally positioned as is the ligament of Treitz. No small bowel wall thickening. No small bowel dilatation. The terminal ileum is normal. The appendix is normal. No gross colonic mass. No colonic wall thickening. Vascular/Lymphatic: No abdominal aortic aneurysm. No abdominal aortic atherosclerotic calcification. There is no gastrohepatic or hepatoduodenal ligament lymphadenopathy. No retroperitoneal or mesenteric lymphadenopathy. No pelvic sidewall lymphadenopathy. Reproductive: Calcified uterine fibroids evident. There is no adnexal mass. Other: No intraperitoneal free fluid. Musculoskeletal: No worrisome lytic or sclerotic osseous abnormality. IMPRESSION: 1. No acute findings  in the abdomen or pelvis. Specifically, no findings to explain the patient's history of abdominal pain. 2. Calcified uterine fibroids. Electronically Signed   By: Kennith Center M.D.   On: 03/17/2023 10:45    Procedures Procedures    Medications Ordered in ED Medications  ondansetron (ZOFRAN) injection 4 mg (has no administration in time range)  fentaNYL (SUBLIMAZE) injection 50 mcg (has no administration in time range)  potassium chloride 10 mEq in 100 mL IVPB ( Intravenous Restarted 03/17/23 1136)  lactated ringers infusion ( Intravenous New Bag/Given 03/17/23 1131)  potassium chloride SA (KLOR-CON M) CR tablet 40 mEq (has no administration in time range)  lactated ringers bolus 1,000 mL (0 mLs Intravenous Stopped 03/17/23 1050)    ED Course/ Medical Decision Making/ A&P                             Medical Decision Making Amount and/or Complexity of Data Reviewed Labs: ordered. Radiology: ordered.  Risk Prescription drug management. Decision regarding hospitalization.   This patient presents to the ED with chief complaint(s) of abdominal pain, nausea, vomiting, diarrhea with pertinent past medical history of hypertension, diabetes and cholecystectomy.The complaint involves an extensive differential diagnosis and also carries with it a high risk of complications and morbidity.    The differential diagnosis includes : Small bowel obstruction, ileus, gastroenteritis, colitis, GI bleed, severe electrolyte abnormality, acute on chronic renal failure, diverticulitis, intra-abdominal infection.  The initial plan is to get basic labs. Will get CT abdomen pelvis without contrast as well.   Additional history obtained: Records reviewed Primary Care Documents.  Reviewed patient's previous labs including baseline creatinine.  Independent labs interpretation:  The following labs were independently interpreted: Patient has slightly low mag of 1.7.  She also has hypokalemia with potassium  of 2.6.  Patient does not have anion gap, but her bicarb is low.  Her creatinine is also jumped to 2.44.  Her symptoms are all consistent with GI loss.  Independent visualization and interpretation of imaging: - I independently visualized the following imaging with scope of interpretation limited to determining acute life threatening conditions related to emergency care: CT abdomen pelvis, which revealed no evidence of free air  Treatment and Reassessment: Results of the ED workup discussed with the patient.  We will admit her to the hospital for acute on chronic renal failure  along with hypokalemia and intractable nausea and vomiting.   Final Clinical Impression(s) / ED Diagnoses Final diagnoses:  Acute hypokalemia  AKI (acute kidney injury) [facility])    Rx / DC Orders ED Discharge Orders     None         Derwood Kaplan, MD 03/17/23 1232

## 2023-03-17 NOTE — Hospital Course (Addendum)
Carla Brooks is a 52 y.o.female with a history of DMII, HTN, CKD III s/p cholecystectomy who was admitted to the [facility] Teaching Service at [facility] for intractable vomiting and diarrhea . Her hospital course is detailed below:  Nausea/ Vomiting: Presented with abdominal pain, vomiting and non-bloody diarrhea for several day. In the hospital, patients work up included Hepatitis panel, TSH, Beta hydroxybutyrate, GI pathogen panel which were negative. Patients nausea and vomiting improved on IVF and she was stable on discharge. She was prescribed compazine 5 mg tablets for nausea at discharge.   DMII: Patients A1C in the hospital was 5.9. CBGs during her stay were WNL. With her low A1C, we lowered her dose of metformin to 500 mg BID.   AKI: Patient has history of CKD III but Cr was 2.44, up from baseline of 0.9 -1.3. Likely in the setting of dehydration. Patients Cr improved with IVF and at discharge was 1.58.   Hypokalemia: Upon admission, K was 2.6, likely in the setting of diarrhea. Repleted during admission. Initial EKG concerning for borderline prolonged QT. Rechecked after K repletion, which was 3.4.  Other chronic conditions were medically managed with home medications and formulary alternatives as necessary (HTN, Bipolar Disorder)  PCP Follow-up Recommendations: Patient admitted with low K, consider BMP to monitor K on follow up Patient A1C was 5.9 during this admission, lowered her dose of metformin. Consider following up on adherance and CBGs. Patient has many psychotrops on her medication list, consider evaluating for necessary and reconciling.

## 2023-03-17 NOTE — ED Notes (Signed)
PT attempted to provide urine sample but was unsuccessful.

## 2023-03-17 NOTE — ED Notes (Addendum)
Breathing e/u, no signs of distress. Skin warm and intact. Skin appears to be very dry. Bowel sounds present and normoactive. Pt endorses "a little bit" of pain with palpation of LUQ. Pt reports PMH of a Cholecystectomy. PT also reports stage 3 kidney disease with notable decrease of UOP.

## 2023-03-17 NOTE — Assessment & Plan Note (Addendum)
Patient last A1C in 2013 was 5.8. Patient currently taking jardiance 10 mg and metformin 1000mg   - hold jardiance for concern of euglycemic DKA - hold metformin d/t diarrhea  - A1C pending

## 2023-03-17 NOTE — Progress Notes (Signed)
FMTS Interim Progress Note  S: Into see the patient at bedside on nighttime rounds with Dr. Lily Kocher.  She says she is feeling better.  Has not had any episodes of vomiting.  Still having a little bit of abdominal pain in the periumbilical, lower abdominal region  O: BP 135/82 (BP Location: Left Arm)   Pulse 95   Temp 98.5 F (36.9 C)   Resp 18   Ht 5\' 7"  (1.702 m)   Wt 92.9 kg   LMP 04/02/2017 (Approximate)   SpO2 100%   BMI 32.06 kg/m   General: Resting in bed, texting on her phone Respiratory: Normal work of breathing on room air Abdomen: Soft, mildly tender in periumbilical/lower mid abdomen without rebound or guarding.  No peritoneal signs.   A/P: Intractable nausea and vomiting Unclear etiology at this time, differential remains broad. Reassuringly, no further episodes of emesis this evening. Vital signs stable.  Reviewed daytime notes. -Follow-up GI pathogen panel -Continue maintenance IV fluids (LR at 100 mL/hour), s/p 1 L bolus -Zofran 4 mg every 6 hours as needed  Hypokalemia Significantly hypokalemic on admission, 2.6 and repleted with 60 mEq -Ordered repeat potassium now, will replete if needed  Hypertension Blood pressure is on the rise, systolics in the 140s-150s. -Resume home amlodipine 5 mg daily in the morning  Other problems chronic and stable.  Labs and orders reviewed.  Darral Dash, DO 03/17/2023, 7:38 PM PGY-3, [facility] Family Medicine Service pager [phone]

## 2023-03-17 NOTE — H&P (Addendum)
Hospital Admission History and Physical Service Pager: [phone]  Patient name: Carla Brooks Medical record number: [id number] Date of Birth: October 04, 1970 Age: 52 y.o. Gender: female  Primary Care Provider: Vernona Rieger, MD Consultants: None Code Status: Full  Preferred Emergency Contact:  Contact Information     Name Relation Home Work Mobile   Tuman,Rosa Mother [phone]        Other Contacts   None on File      Chief Complaint: Nausea, vomiting, diarrhea, lethargy for 4 days  Assessment and Plan: Carla Brooks is a 52 y.o. female presenting with abdominal pain, caramel colored vomiting, nonbloody diarrhea for the last 3 to 4 days. .  Differential for presentation of this includes gastroenteritis, gastroparesis, acute hepatitis, less likely euvolemic DKA on Jardiance, PID.   -      Hospital     * (Principal) Intractable nausea and vomiting     Broad differential as above. Seems most likely gastroenteritis with secondary volume depletion given N/V/D, though no obvious sick contacts or sources. S/p 1L fluid resuscitation now with mIVF running. - Med-Surg, obs patient - GI pathogen panel - Continue mIVF for now  - Zofran 4mg  q6h PRN - Hepatitis panel, TSH, BHB and VBG        Hypokalemia     In ED, K+ found to be 2.6. ED repleted 10 mEq x2. EKG with minimally prolonged QRS to 128. - repeat EKG - Further repletion ordered  - repeat AM CMP        HTN (hypertension)   - Continue home amlodipine and labetalol  - hold home ARB in setting of AKI         Diabetes mellitus without complication (HCC)     Patient last A1C in 2013 was 5.8. Patient currently taking jardiance 10  mg and metformin 1000mg   - hold jardiance for dehydration and possibility of euglycemic DKA, restart as able  - hold metformin d/t diarrhea  - A1C pending        AKI (acute kidney injury) (HCC)     Patient with hx of CKD III. Cr 2.4 up from baseline around 1.0. Likely  in the  setting of dehydration. S/P 1L bolus - continue mIVF  - AM BMP      Chronic Conditions: Complex psych hx including bipolar I disorder and PTSD: Prazosin 2 mg at bedtime Lamictal 300 mg BID Hydroxyzine 25 mg BID PRN Klonopin 1 mg BID PRN Buspar 22.5mg  BID Cariprazine 3 mg   FEN/GI: Full VTE Prophylaxis: SCDs due to allergy to pork-containing products (reports increases BP)  Disposition: Med Surg  History of Present Illness:  Carla Brooks is a 52 y.o. female with past medical history of diabetes, CKD 3, hypertension, s/p cholecystectomy presenting with abdominal pain, nausea, vomiting, diarrhea for the past 4 days.  Patient reports that she has been feeling ill and now has started to feel dizzy.  She reports that she has not lost consciousness.  She reports that she has no sick contacts and has not been fevering. She states that she has not been able to keep anything down and that her vomit is caramel colored. She also reports diarrhea which is all liquid, brown, without blood. She denies any UTI-like symptoms.  She denies any vaginal discharge or vaginal pain.  She denies cough, fever, shortness of breath, headache.  Patient currently living in shelter with daughter.   In the ED, patient received 1 L LR bolus and was started on  maintenance fluids.  Her potassium was 2.6, repleted with 40 mEq.  CT abdomen pelvis showed no evidence of acute process.   Review Of Systems: Per HPI  Pertinent Past Medical History: Diabetes on Jardiance, HTN, S/P cholecystectomy   Remainder reviewed in history tab.   Pertinent Past Surgical History: Cholecystectomy 2003    Remainder reviewed in history tab.  Pertinent Social History: Tobacco use: No Alcohol use: Denies Other Substance use: Denies Lives with daughter in womens shelter  Pertinent Family History: Per chart   Remainder reviewed in history tab.   Important Outpatient Medications: Norvasc 5 mg  Labetolol 300 BID Losartan  300 BID  Jardiance 10 mg Metformin 1000 mg BID  Prazosin 2 mg at bedtime Lamictal 300 mg BID Hydroxyzine 25 mg BID PRN Klonopin 1 mg BID PRN Buspar 22.5mg  BID Cariprazine 3 mg   Gabapentin 300 mg    Remainder reviewed in medication history.   Objective: BP 126/71   Pulse 82   Temp 97.7 F (36.5 C) (Oral)   Resp 14   Ht 5\' 7"  (1.702 m)   Wt 99.8 kg   LMP 04/02/2017 (Approximate)   SpO2 100%   BMI 34.46 kg/m  Exam: General: no acute distress, laying in bed  Eyes: EOM intact  Cardiovascular: RRR, no M/R/G Respiratory: NWOB Gastrointestinal: TTP diffusely, more on the LLQ with deep palpation  MSK: moves all extremities equally  Neuro: A&OX4 Psych: Pleasant, flat affect   Labs:  CBC BMET  Recent Labs  Lab 03/17/23 0835  WBC 12.6*  HGB 12.6  HCT 37.2  PLT 496*   Recent Labs  Lab 03/17/23 0835  NA 138  K 2.6*  CL 104  CO2 20*  BUN 27*  CREATININE 2.44*  GLUCOSE 142*  CALCIUM 9.4      EKG: Sinus rhythm, minimally prolonged QRS at , QTc 486    Imaging Studies Performed: CT abdomen: no acute findings   Independently reviewed and agree with radiologist interpretation.   Baloch, Mahnoor Baloch, MD 03/17/2023, 3:26 PM PGY-1, [facility] Medicine  FPTS Intern pager: [phone], text pages welcome Secure chat group [facility] Teaching Service     I have evaluated this patient along with Dr. Georg Ruddle and reviewed the above note, making necessary revisions.  Dorothyann Gibbs, MD 03/17/2023, 3:51 PM PGY-3, [facility] Medicine

## 2023-03-17 NOTE — Assessment & Plan Note (Signed)
Patient normotensive during admission. - Hold all BP meds for now

## 2023-03-17 NOTE — Assessment & Plan Note (Addendum)
Patient presenting with nausea, vomiting, diarrhea and general malaise for the past four days. Patient complains of diffuse abdominal pain, more TTP at LLQ. CT abdomen negative in ED. Differential includes gastroenteritis, gastroparesis in the setting of diabetes, acute hepatitis as she had elevated LFTs, hypothyroidism, less likely euvolemic DKA 2/2 for jardiance or PID as she does not complaint of any GU sxs. - GI panel - Hepatitis panel - TSH - beta hydroxybutyrate  - VBG

## 2023-03-17 NOTE — ED Notes (Signed)
ED TO INPATIENT HANDOFF REPORT  ED Nurse Name and Phone #: [phone] Jaziah Goeller, RN  S Name/Age/Gender Carla Brooks 52 y.o. female Room/Bed: 040C/040C  Code Status   Code Status: Full Code  Home/SNF/Other Home Patient oriented to: self, place, time, and situation Is this baseline? Yes   Triage Complete: Triage complete  Chief Complaint Intractable nausea and vomiting [R11.2]  Triage Note Patient presents to ed via [facility] from [facility] c/o n/v/d with abd. Pain onset 4 days ago  c/o dizziness when standing.    Allergies Allergies  Allergen Reactions   Shrimp [Shellfish Allergy] Anaphylaxis and Hives   Hydrocodone Hives   Penicillins Hives and Other (See Comments)    Has patient had a PCN reaction causing immediate rash, facial/tongue/throat swelling, SOB or lightheadedness with hypotension: No Has patient had a PCN reaction causing severe rash involving mucus membranes or skin necrosis: No Has patient had a PCN reaction that required hospitalization No Has patient had a PCN reaction occurring within the last 10 years: No If all of the above answers are "NO", then may proceed with Cephalosporin use.   Pork-Derived Products Other (See Comments)    Reaction:  Increases pts BP    Level of Care/Admitting Diagnosis ED Disposition     ED Disposition  Admit   Condition  --   Comment  Hospital Area: [facility] [[id number]]  Level of Care: Med-Surg [16]  May place patient in observation at [facility] or [facility] if equivalent level of care is available:: No  Covid Evaluation: Asymptomatic - no recent exposure (last 10 days) testing not required  Diagnosis: Intractable nausea and vomiting [720114]  Admitting Physician: Penne Lash [facility] [[id number]]  Attending Physician: Acquanetta Belling D [[id number]]          B Medical/Surgery History Past Medical History:  Diagnosis Date   Abnormal Pap smear    Anemia 02/21/2012   Arthritis    Asthma    Bronchitis     Chronic hypertension with superimposed preeclampsia 06/01/2012   Diabetes mellitus (HCC) 02/21/2012   Fibroid    GBS (group B streptococcus) UTI complicating pregnancy 06/04/2012   Needs treatment in labor-resistant to Clinda and Erythromycin!     GERD (gastroesophageal reflux disease)    HTN (hypertension) 02/21/2012   Migraines 02/21/2012   Uterine fibroids affecting pregnancy, antepartum 07/27/2012   Past Surgical History:  Procedure Laterality Date   CESAREAN SECTION N/A 10/29/2012   Procedure: CESAREAN SECTION;  Surgeon: Lazaro Arms, MD;  Location: [facility];  Service: Obstetrics;  Laterality: N/A;   CHOLECYSTECTOMY  09/27/2001     A IV Location/Drains/Wounds Patient Lines/Drains/Airways Status     Active Line/Drains/Airways     Name Placement date Placement time Site Days   Peripheral IV 03/17/23 20 G Right Antecubital 03/17/23  0835  Antecubital  less than 1            Intake/Output Last 24 hours  Intake/Output Summary (Last 24 hours) at 03/17/2023 1520 Last data filed at 03/17/2023 1236 Gross per 24 hour  Intake 1100 ml  Output --  Net 1100 ml    Labs/Imaging Results for orders placed or performed during the hospital encounter of 03/17/23 (from the past 48 hour(s))  Lipase, blood     Status: None   Collection Time: 03/17/23  8:35 AM  Result Value Ref Range   Lipase 44 11 - 51 U/L    Comment: Performed at [facility], [address]  [place], [place] [address]  Comprehensive metabolic panel     Status: Abnormal   Collection Time: 03/17/23  8:35 AM  Result Value Ref Range   Sodium 138 135 - 145 mmol/L   Potassium 2.6 (LL) 3.5 - 5.1 mmol/L    Comment: CRITICAL RESULT CALLED TO, READ BACK BY AND VERIFIED WITH C.OSORIO,RN 0944 03/17/23 CLARK,S   Chloride 104 98 - 111 mmol/L   CO2 20 (L) 22 - 32 mmol/L   Glucose, Bld 142 (H) 70 - 99 mg/dL    Comment: Glucose reference range applies only to samples taken after fasting for at least 8 hours.   BUN 27 (H)  6 - 20 mg/dL   Creatinine, Ser 2.95 (H) 0.44 - 1.00 mg/dL   Calcium 9.4 8.9 - 28.4 mg/dL   Total Protein 7.7 6.5 - 8.1 g/dL   Albumin 4.2 3.5 - 5.0 g/dL   AST 57 (H) 15 - 41 U/L   ALT 131 (H) 0 - 44 U/L   Alkaline Phosphatase 121 38 - 126 U/L   Total Bilirubin 0.8 0.3 - 1.2 mg/dL   GFR, Estimated 24 (L) >60 mL/min    Comment: (NOTE) Calculated using the CKD-EPI Creatinine Equation (2021)    Anion gap 14 5 - 15    Comment: Performed at [facility], [address] [place], [place] [address]  CBC     Status: Abnormal   Collection Time: 03/17/23  8:35 AM  Result Value Ref Range   WBC 12.6 (H) 4.0 - 10.5 K/uL   RBC 4.18 3.87 - 5.11 MIL/uL   Hemoglobin 12.6 12.0 - 15.0 g/dL   HCT 01.0 27.2 - 53.6 %   MCV 89.0 80.0 - 100.0 fL   MCH 30.1 26.0 - 34.0 pg   MCHC 33.9 30.0 - 36.0 g/dL   RDW 64.4 03.4 - 74.2 %   Platelets 496 (H) 150 - 400 K/uL   nRBC 0.0 0.0 - 0.2 %    Comment: Performed at [facility], [address], [place], [place] [address]  Magnesium     Status: None   Collection Time: 03/17/23  9:00 AM  Result Value Ref Range   Magnesium 1.7 1.7 - 2.4 mg/dL    Comment: Performed at [facility], [address] [place], [place] [address]   CT ABDOMEN PELVIS WO CONTRAST  Result Date: 03/17/2023 CLINICAL DATA:  Nonlocalized abdominal pain EXAM: CT ABDOMEN AND PELVIS WITHOUT CONTRAST TECHNIQUE: Multidetector CT imaging of the abdomen and pelvis was performed following the standard protocol without IV contrast. RADIATION DOSE REDUCTION: This exam was performed according to the departmental dose-optimization program which includes automated exposure control, adjustment of the mA and/or kV according to patient size and/or use of iterative reconstruction technique. COMPARISON:  02/23/2012 FINDINGS: Lower chest: Unremarkable. Hepatobiliary: No suspicious focal abnormality in the liver on this study without intravenous contrast. Gallbladder is surgically absent. No  intrahepatic or extrahepatic biliary dilation. Pancreas: No focal mass lesion. No dilatation of the main duct. No intraparenchymal cyst. No peripancreatic edema. Spleen: No splenomegaly. No suspicious focal mass lesion. Adrenals/Urinary Tract: No adrenal nodule or mass. Kidneys unremarkable. No evidence for hydroureter. The urinary bladder appears normal for the degree of distention. Stomach/Bowel: Stomach is unremarkable. No gastric wall thickening. No evidence of outlet obstruction. Duodenum is normally positioned as is the ligament of Treitz. No small bowel wall thickening. No small bowel dilatation. The terminal ileum is normal. The appendix is normal. No gross colonic  mass. No colonic wall thickening. Vascular/Lymphatic: No abdominal aortic aneurysm. No abdominal aortic atherosclerotic calcification. There is no gastrohepatic or hepatoduodenal ligament lymphadenopathy. No retroperitoneal or mesenteric lymphadenopathy. No pelvic sidewall lymphadenopathy. Reproductive: Calcified uterine fibroids evident. There is no adnexal mass. Other: No intraperitoneal free fluid. Musculoskeletal: No worrisome lytic or sclerotic osseous abnormality. IMPRESSION: 1. No acute findings in the abdomen or pelvis. Specifically, no findings to explain the patient's history of abdominal pain. 2. Calcified uterine fibroids. Electronically Signed   By: Kennith Center M.D.   On: 03/17/2023 10:45    Pending Labs Unresulted Labs (From admission, onward)     Start     Ordered   03/18/23 0500  Comprehensive metabolic panel  Tomorrow morning,   R        03/17/23 1332   03/18/23 0500  CBC  Tomorrow morning,   R        03/17/23 1332   03/17/23 1333  Gastrointestinal Panel by PCR , Stool  (Gastrointestinal Panel by PCR, Stool                                                                                                                                                     **Does Not include CLOSTRIDIUM DIFFICILE testing. **If CDIFF  testing is needed, place order from the "C Difficile Testing" order set.**)  Once,   R        03/17/23 1332   03/17/23 1332  Hemoglobin A1c  Once,   R        03/17/23 1332   03/17/23 1331  HIV Antibody (routine testing w rflx)  (HIV Antibody (Routine testing w reflex) panel)  Once,   R        03/17/23 1332   03/17/23 1331  TSH  Once,   R        03/17/23 1332   03/17/23 1328  Hepatitis panel, acute  Once,   R        03/17/23 1332   03/17/23 1327  Blood gas, venous  Once,   R        03/17/23 1332   03/17/23 1326  Beta-hydroxybutyric acid  Once,   R        03/17/23 1332   03/17/23 0835  Urinalysis, Routine w reflex microscopic -Urine, Clean Catch  Once,   URGENT       Question:  Specimen Source  Answer:  Urine, Clean Catch   03/17/23 0835            Vitals/Pain Today's Vitals   03/17/23 1215 03/17/23 1230 03/17/23 1336 03/17/23 1401  BP: 137/80 126/71    Pulse: 85 82    Resp: 15 14    Temp:    97.7 F (36.5 C)  TempSrc:    Oral  SpO2: 100% 100%    Weight:  Height:      PainSc:   0-No pain     Isolation Precautions No active isolations  Medications Medications  ondansetron (ZOFRAN) injection 4 mg (has no administration in time range)  lactated ringers infusion ( Intravenous New Bag/Given 03/17/23 1131)  busPIRone (BUSPAR) tablet 22.5 mg (has no administration in time range)  hydrOXYzine (VISTARIL) capsule 25 mg (has no administration in time range)  sertraline (ZOLOFT) tablet 50 mg (has no administration in time range)  cariprazine (VRAYLAR) capsule 3 mg (has no administration in time range)  gabapentin (NEURONTIN) capsule 300 mg (has no administration in time range)  lamoTRIgine (LAMICTAL) tablet 100 mg (has no administration in time range)  acetaminophen (TYLENOL) tablet 650 mg (has no administration in time range)    Or  acetaminophen (TYLENOL) suppository 650 mg (has no administration in time range)  prazosin (MINIPRESS) capsule 2 mg (has no administration in  time range)  clonazePAM (KLONOPIN) tablet 1 mg (has no administration in time range)  magnesium sulfate IVPB 2 g 50 mL (has no administration in time range)  lactated ringers bolus 1,000 mL (0 mLs Intravenous Stopped 03/17/23 1050)  potassium chloride 10 mEq in 100 mL IVPB (0 mEq Intravenous Stopped 03/17/23 1514)  potassium chloride SA (KLOR-CON M) CR tablet 40 mEq (40 mEq Oral Given 03/17/23 1314)    Mobility walks     Focused Assessments    R Recommendations: See Admitting Provider Note  Report given to:   Additional Notes: Patient is alert and oriented ambulatory with assistance, currently getting M+, other meds not yet verified by pharmacy

## 2023-03-17 NOTE — ED Notes (Signed)
Follow-up with lab for mag results

## 2023-03-18 ENCOUNTER — Other Ambulatory Visit (HOSPITAL_COMMUNITY): Payer: Self-pay

## 2023-03-18 DIAGNOSIS — E1122 Type 2 diabetes mellitus with diabetic chronic kidney disease: Secondary | ICD-10-CM | POA: Diagnosis not present

## 2023-03-18 DIAGNOSIS — R112 Nausea with vomiting, unspecified: Secondary | ICD-10-CM | POA: Diagnosis not present

## 2023-03-18 DIAGNOSIS — I129 Hypertensive chronic kidney disease with stage 1 through stage 4 chronic kidney disease, or unspecified chronic kidney disease: Secondary | ICD-10-CM | POA: Diagnosis not present

## 2023-03-18 DIAGNOSIS — N183 Chronic kidney disease, stage 3 unspecified: Secondary | ICD-10-CM | POA: Diagnosis not present

## 2023-03-18 LAB — URINALYSIS, ROUTINE W REFLEX MICROSCOPIC
Bilirubin Urine: NEGATIVE
Glucose, UA: 50 mg/dL — AB
Ketones, ur: 20 mg/dL — AB
Nitrite: NEGATIVE
Protein, ur: NEGATIVE mg/dL
Specific Gravity, Urine: 1.012 (ref 1.005–1.030)
pH: 5 (ref 5.0–8.0)

## 2023-03-18 LAB — GASTROINTESTINAL PANEL BY PCR, STOOL (REPLACES STOOL CULTURE)

## 2023-03-18 LAB — COMPREHENSIVE METABOLIC PANEL
ALT: 94 U/L — ABNORMAL HIGH (ref 0–44)
AST: 38 U/L (ref 15–41)
Albumin: 3.4 g/dL — ABNORMAL LOW (ref 3.5–5.0)
Alkaline Phosphatase: 100 U/L (ref 38–126)
Anion gap: 10 (ref 5–15)
BUN: 21 mg/dL — ABNORMAL HIGH (ref 6–20)
CO2: 20 mmol/L — ABNORMAL LOW (ref 22–32)
Calcium: 8.7 mg/dL — ABNORMAL LOW (ref 8.9–10.3)
Chloride: 108 mmol/L (ref 98–111)
Creatinine, Ser: 1.58 mg/dL — ABNORMAL HIGH (ref 0.44–1.00)
GFR, Estimated: 40 mL/min — ABNORMAL LOW (ref >60)
Glucose, Bld: 101 mg/dL — ABNORMAL HIGH (ref 70–99)
Potassium: 2.7 mmol/L — CL (ref 3.5–5.1)
Sodium: 138 mmol/L (ref 135–145)
Total Bilirubin: 0.8 mg/dL (ref 0.3–1.2)
Total Protein: 6.1 g/dL — ABNORMAL LOW (ref 6.5–8.1)

## 2023-03-18 LAB — CBC
HCT: 30.9 % — ABNORMAL LOW (ref 36.0–46.0)
Hemoglobin: 10.3 g/dL — ABNORMAL LOW (ref 12.0–15.0)
MCH: 28.8 pg (ref 26.0–34.0)
MCHC: 33.3 g/dL (ref 30.0–36.0)
MCV: 86.3 fL (ref 80.0–100.0)
Platelets: 423 10*3/uL — ABNORMAL HIGH (ref 150–400)
RBC: 3.58 MIL/uL — ABNORMAL LOW (ref 3.87–5.11)
RDW: 13.4 % (ref 11.5–15.5)
WBC: 10.4 10*3/uL (ref 4.0–10.5)
nRBC: 0 % (ref 0.0–0.2)

## 2023-03-18 LAB — POTASSIUM: Potassium: 3.4 mmol/L — ABNORMAL LOW (ref 3.5–5.1)

## 2023-03-18 LAB — MAGNESIUM: Magnesium: 1.9 mg/dL (ref 1.7–2.4)

## 2023-03-18 MED ORDER — PROCHLORPERAZINE MALEATE 5 MG PO TABS
5.0000 mg | ORAL_TABLET | Freq: Four times a day (QID) | ORAL | 0 refills | Status: AC | PRN
  Filled 2023-03-18: qty 15, 4d supply, fill #0

## 2023-03-18 MED ORDER — POTASSIUM CHLORIDE CRYS ER 20 MEQ PO TBCR
40.0000 meq | EXTENDED_RELEASE_TABLET | Freq: Once | ORAL | Status: AC
  Administered 2023-03-18: 40 meq via ORAL
  Filled 2023-03-18: qty 2

## 2023-03-18 MED ORDER — METFORMIN HCL 500 MG PO TABS
500.0000 mg | ORAL_TABLET | Freq: Two times a day (BID) | ORAL | 0 refills | Status: AC
  Filled 2023-03-18: qty 60, 30d supply, fill #0

## 2023-03-18 NOTE — Discharge Summary (Cosign Needed Addendum)
Family Medicine Teaching [facility] Discharge Summary  Patient name: Carla Brooks Medical record number: [id number] Date of birth: 05-03-1971 Age: 52 y.o. Gender: female Date of Admission: 03/17/2023  Date of Discharge: 03/18/23  Admitting Physician: Mahnoor Jeni Salles, MD  Primary Care Provider: Vernona Rieger, MD Consultants: None  Indication for Hospitalization: Intractable Nausea and Vomiting   Brief Hospital Course:  Carla Brooks is a 52 y.o.female with a history of DMII, HTN, CKD III s/p cholecystectomy who was admitted to the [facility] at [facility] for intractable vomiting and diarrhea . Her hospital course is detailed below:  Nausea/ Vomiting: Presented with abdominal pain, vomiting and non-bloody diarrhea for several day. In the hospital, patients work up included Hepatitis panel, TSH, Beta hydroxybutyrate, GI pathogen panel which were negative. Patients nausea and vomiting improved on IVF and she was stable on discharge. She was prescribed compazine 5 mg tablets for nausea at discharge.   DMII: Patients A1C in the hospital was 5.9. CBGs during her stay were WNL. With her low A1C, we lowered her dose of metformin to 500 mg BID.   AKI: Patient has history of CKD III but Cr was 2.44, up from baseline of 0.9 -1.3. Likely in the setting of dehydration. Patients Cr improved with IVF and at discharge was 1.58.   Hypokalemia: Upon admission, K was 2.6, likely in the setting of diarrhea. Repleted during admission. Initial EKG concerning for borderline prolonged QT. Rechecked after K repletion, which was 3.4.  Other chronic conditions were medically managed with home medications and formulary alternatives as necessary (HTN, Bipolar Disorder)  PCP Follow-up Recommendations: Patient admitted with low K, consider BMP to monitor K on follow up Patient A1C was 5.9 during this admission, lowered her dose of metformin. Consider following up on adherance and  CBGs. Patient has many psychotrops on her medication list, consider evaluating for necessary and reconciling.   Discharge Diagnoses/Problem List:  Principal Problem:   Intractable nausea and vomiting Active Problems:   HTN (hypertension)   Diabetes mellitus without complication (HCC)   Acute hypokalemia   AKI (acute kidney injury) (HCC)   Disposition: Home  Discharge Condition: Stable   Discharge Exam:  General: Resting comfortably in bed Cardiovascular: Regular rate and rhythm no murmurs rubs or gallops Respiratory: Normal work of breathing on room air Abdomen: Soft, mildly tender over left lower quadrant no rebound or guarding Extremities: Moves all extremities equally  Significant Procedures: None  Significant Labs and Imaging:  Recent Labs  Lab 03/17/23 0835 03/18/23 0211  WBC 12.6* 10.4  HGB 12.6 10.3*  HCT 37.2 30.9*  PLT 496* 423*   Recent Labs  Lab 03/17/23 0835 03/17/23 0900 03/17/23 1952 03/18/23 0211 03/18/23 0702 03/18/23 1027  NA 138  --   --  138  --   --   K 2.6*  --    < > 2.7*  --  3.4*  CL 104  --   --  108  --   --   CO2 20*  --   --  20*  --   --   GLUCOSE 142*  --   --  101*  --   --   BUN 27*  --   --  21*  --   --   CREATININE 2.44*  --   --  1.58*  --   --   CALCIUM 9.4  --   --  8.7*  --   --   MG  --  1.7  --   --  1.9  --   ALKPHOS 121  --   --  100  --   --   AST 57*  --   --  38  --   --   ALT 131*  --   --  94*  --   --   ALBUMIN 4.2  --   --  3.4*  --   --    < > = values in this interval not displayed.    CT Abdomen/Pelvis: No Acute findings in abdomen or pelvis. Calcified uterine fibroids.   Results/Tests Pending at Time of Discharge: None  Discharge Medications:  Allergies as of 03/18/2023       Reactions   Shrimp [shellfish Allergy] Anaphylaxis, Hives   Hydrocodone Hives   Penicillins Hives, Other (See Comments)   Has patient had a PCN reaction causing immediate rash, facial/tongue/throat swelling, SOB or  lightheadedness with hypotension: No Has patient had a PCN reaction causing severe rash involving mucus membranes or skin necrosis: No Has patient had a PCN reaction that required hospitalization No Has patient had a PCN reaction occurring within the last 10 years: No If all of the above answers are "NO", then may proceed with Cephalosporin use.   Pork-derived Products Other (See Comments)   Reaction:  Increases pts BP        Medication List     STOP taking these medications    trimethoprim-polymyxin b ophthalmic solution Commonly known as: POLYTRIM       TAKE these medications    albuterol 108 (90 Base) MCG/ACT inhaler Commonly known as: VENTOLIN HFA Inhale 2 puffs into the lungs every 6 (six) hours as needed for shortness of breath.   amLODipine 5 MG tablet Commonly known as: NORVASC Take 5 mg by mouth daily.   busPIRone 15 MG tablet Commonly known as: BUSPAR Take 1.5 tablets (22.5 mg total) by mouth 2 (two) times daily.   cariprazine 3 MG capsule Commonly known as: Vraylar Take 1 capsule (3 mg total) by mouth daily.   clonazePAM 1 MG tablet Commonly known as: KLONOPIN Take 1 mg by mouth 2 (two) times daily as needed for anxiety.   EPINEPHrine 0.3 mg/0.3 mL Devi Commonly known as: EpiPen Inject 0.3 mLs (0.3 mg total) into the muscle as needed. Use as directed in the event of a severe allergic reaction   gabapentin 300 MG capsule Commonly known as: NEURONTIN Take 300 mg by mouth 3 (three) times daily.   hydrOXYzine 25 MG capsule Commonly known as: VISTARIL Take 25 mg by mouth 2 (two) times daily as needed for anxiety.   Jardiance 10 MG Tabs tablet Generic drug: empagliflozin Take 10 mg by mouth daily.   labetalol 300 MG tablet Commonly known as: NORMODYNE Take 300 mg by mouth 2 (two) times daily.   lamoTRIgine 100 MG tablet Commonly known as: LAMICTAL Take 1 tablet (100 mg total) by mouth 2 (two) times daily.   losartan 25 MG tablet Commonly known  as: COZAAR Take 25 mg by mouth 2 (two) times daily.   meclizine 25 MG tablet Commonly known as: ANTIVERT Take 1 tablet by mouth at bedtime.   metFORMIN 500 MG tablet Commonly known as: GLUCOPHAGE Take 1 tablet (500 mg total) by mouth 2 (two) times daily with a meal. What changed:  medication strength how much to take when to take this   omeprazole 20 MG capsule Commonly known as: PRILOSEC Take 20 mg by mouth 2 (two) times daily.   prazosin 2  MG capsule Commonly known as: MINIPRESS Take 1 capsule (2 mg total) by mouth at bedtime.   prochlorperazine 5 MG tablet Commonly known as: COMPAZINE Take 1 tablet (5 mg total) by mouth every 6 (six) hours as needed for nausea or vomiting.   sertraline 50 MG tablet Commonly known as: Zoloft Take 1 tablet (50 mg total) by mouth daily.   tiZANidine 4 MG tablet Commonly known as: ZANAFLEX Take 4 mg by mouth every 6 (six) hours as needed for muscle spasms.   traZODone 50 MG tablet Commonly known as: DESYREL Take 1 tablet (50 mg total) by mouth at bedtime.        Discharge Instructions: Please refer to Patient Instructions section of EMR for full details.  Patient was counseled important signs and symptoms that should prompt return to medical care, changes in medications, dietary instructions, activity restrictions, and follow up appointments.   Follow-Up Appointments:  Follow-up Information     Vernona Rieger, MD. Schedule an appointment as soon as possible for a visit in 2 day(s).   Specialty: Internal Medicine Contact information: [address] [place] [address] [phone]                 Glendale Chard, DO 03/18/2023, 1:29 PM PGY-2, [facility]

## 2023-03-18 NOTE — Assessment & Plan Note (Signed)
In ED, K+ found to be 2.6. Repleted K+ overnight. - repeat PM BMP - repeat EKG

## 2023-03-18 NOTE — Assessment & Plan Note (Signed)
Patient slightly hypertensive overnight. Held home losartan in setting of AKI, Cr approving today.  - restart home losartan today

## 2023-03-18 NOTE — Assessment & Plan Note (Addendum)
Patient last A1C in 2013 was 5.8. Patient currently taking jardiance 10 mg and metformin 1000mg . A1c was 5.9 - hold jardiance for concern of euglycemic DKA - hold metformin d/t diarrhea

## 2023-03-18 NOTE — Assessment & Plan Note (Signed)
Patient with hx of CKD III. Cr 2.4 up from baseline around 1.0. Likely in the setting of dehydration. Downtrending today 1.58.   - continue mIVF  - continue to monitor

## 2023-03-18 NOTE — Discharge Instructions (Addendum)
Dear Carla Brooks,  Thank you for letting us participate in your care. You were hospitalized for increased vomiting and diarrhea and diagnosed with Intractable nausea and vomiting. You were treated with IV rehydration and potassium supplementation. Please continue to hydrate and eat as tolerated.   POST-HOSPITAL & CARE INSTRUCTIONS Your A1C in the hospital was 5.9, so we decreased your metformin dose to 500mg  BID. Please follow up with your PCP regarding long term management of your diabetes You were given a medication to help with nausea, please take that medication as needed.  Please schedule a follow up appointment with your primary care provider for hospital follow up.  Go to your follow up appointments (listed below)   DOCTOR'S APPOINTMENT   Future Appointments  Date Time Provider Department Center  03/20/2023 10:00 AM Nwoko, Tommas Olp, PA [facility] None    Follow-up Information     Vernona Rieger, MD. Schedule an appointment as soon as possible for a visit in 2 day(s).   Specialty: Internal Medicine Contact information: [address] [place] [employer] [place] [address] [phone]                 Take care and be well!  Family Medicine Teaching Service Inpatient Team [place]  [facility]  [address] [place], [place] [address] [phone]

## 2023-03-18 NOTE — Assessment & Plan Note (Signed)
Patient presenting with nausea, vomiting, diarrhea and general malaise for the past four days. Patient complains of diffuse abdominal pain, more TTP at LL[facility]. CT abdomen negative in ED. Differential includes gastroenteritis, gastroparesis in the setting of diabetes, acute hepatitis as she had elevated LFTs, hypothyroidism, less likely euvolemic DKA 2/2 for jardiance or PID as she does not complaint of any GU sxs. Hepatitis panel negative. TSH normal. VBG showed compensated metabolic acidosis. In the setting of negative work up, likely 2/2 to gastroenteritis.  - GI panel pending

## 2023-03-18 NOTE — Progress Notes (Signed)
Transition of Care [facility]) - Inpatient Brief Assessment   Patient Details  Name: Ravyn Nikkel MRN: [id number] Date of Birth: 15-Mar-1971  Transition of Care [facility]) CM/SW Contact:    Janae Bridgeman, RN Phone Number: 03/18/2023, 9:06 AM   Clinical Narrative: Patient admitted with Intractable nausea/vomiting and likely gastroenteritis.  Patient provided with social resources in the AVS.  The patient currently resides in [facility] in [place], [place] and should return once medically stable to discharge.  CM will continue to follow the patient for TOC needs.   Transition of Care Asessment: Insurance and Status: (P) Insurance coverage has been reviewed Patient has primary care physician: (P) Yes Home environment has been reviewed: (P) Patient resides at [facility] with daughter Prior level of function:: (P) Independent Prior/Current Home Services: (P) No current home services Social Determinants of Health Reivew: (P) SDOH reviewed interventions complete Readmission risk has been reviewed: (P) Yes Transition of care needs: (P) transition of care needs identified, TOC will continue to follow

## 2023-03-18 NOTE — Plan of Care (Signed)

## 2023-03-20 ENCOUNTER — Encounter (HOSPITAL_COMMUNITY): Payer: MEDICAID | Admitting: Physician Assistant

## 2023-03-20 NOTE — Congregational Nurse Program (Signed)
  Dept: [phone]   Congregational Nurse Program Note  Date of Encounter: 03/20/2023  Past Medical History: Past Medical History:  Diagnosis Date   Abnormal Pap smear    Anemia 02/21/2012   Arthritis    Asthma    Bronchitis    Chronic hypertension with superimposed preeclampsia 06/01/2012   Diabetes mellitus (HCC) 02/21/2012   Fibroid    GBS (group B streptococcus) UTI complicating pregnancy 06/04/2012   Needs treatment in labor-resistant to Clinda and Erythromycin!     GERD (gastroesophageal reflux disease)    HTN (hypertension) 02/21/2012   Migraines 02/21/2012   Uterine fibroids affecting pregnancy, antepartum 07/27/2012    Encounter Details:  CNP Questionnaire - 03/20/23 1051       Questionnaire   Ask client: Do you give verbal consent for me to treat you today? Yes    Student Assistance N/A    Location Patient Served  [facility]    Visit Setting with Client Organization    Patient Status Unhoused    Insurance Medicaid    Insurance/Financial Assistance Referral N/A    Medication N/A    Medical Provider Yes    Screening Referrals Made N/A    Medical Referrals Made N/A    Medical Appointment Made N/A    Recently w/o PCP, now 1st time PCP visit completed due to CNs referral or appointment made N/A    Food Have Food Insecurities    Transportation N/A    Housing/Utilities No permanent housing    Interpersonal Safety N/A    Interventions Case Management;Counsel;Educate    Abnormal to Normal Screening Since Last CN Visit N/A    Screenings CN Performed N/A    Sent Client to Lab for: N/A    Did client attend any of the following based off CNs referral or appointments made? N/A    ED Visit Averted N/A    Life-Saving Intervention Made N/A            Client back at [facility]. Says is feeling better. States she had gastritis related to her kidney condition. Was given meds. and IV treatment. Reminded client to call for follow-up appt. with Dr. Sammuel Cooper  (Internal Medicine) per discharge summary. Says will call today.   Joya Gaskins, RN, BSN

## 2023-03-20 NOTE — Congregational Nurse Program (Signed)
  Dept: [phone]   Congregational Nurse Program Note  Date of Encounter: 03/20/2023  Past Medical History: Past Medical History:  Diagnosis Date   Abnormal Pap smear    Anemia 02/21/2012   Arthritis    Asthma    Bronchitis    Chronic hypertension with superimposed preeclampsia 06/01/2012   Diabetes mellitus (HCC) 02/21/2012   Fibroid    GBS (group B streptococcus) UTI complicating pregnancy 06/04/2012   Needs treatment in labor-resistant to Clinda and Erythromycin!     GERD (gastroesophageal reflux disease)    HTN (hypertension) 02/21/2012   Migraines 02/21/2012   Uterine fibroids affecting pregnancy, antepartum 07/27/2012    Encounter Details:  CNP Questionnaire - 03/20/23 1024       Questionnaire   Ask client: Do you give verbal consent for me to treat you today? N/A    Student Assistance N/A    Location Patient Served  [facility]    Visit Setting with Client Organization    Patient Status Unhoused    Insurance Medicaid    Insurance/Financial Assistance Referral N/A    Medication N/A    Medical Provider Yes    Screening Referrals Made N/A    Medical Referrals Made N/A    Medical Appointment Made N/A    Recently w/o PCP, now 1st time PCP visit completed due to CNs referral or appointment made N/A    Food Have Food Insecurities    Transportation N/A    Housing/Utilities No permanent housing    Interpersonal Safety N/A    Interventions Advocate/Support;Case Management;Counsel;Educate    Abnormal to Normal Screening Since Last CN Visit Blood Pressure    Screenings CN Performed N/A    Sent Client to Lab for: N/A    Did client attend any of the following based off CNs referral or appointments made? N/A    ED Visit Averted N/A    Life-Saving Intervention Made N/A            Informed by [facility] Director that resident was hospitalized for 2 days but was released yesterday and was feeling better. Reviewed hospital record and discharge summary. Client not in her  apartment this morning. Was told her son picked her up. Will follow-up.   Joya Gaskins, RN, BSN

## 2023-04-08 NOTE — Congregational Nurse Program (Signed)
  Dept: [phone]   Congregational Nurse Program Note  Date of Encounter: 04/08/2023  Past Medical History: Past Medical History:  Diagnosis Date   Abnormal Pap smear    Anemia 02/21/2012   Arthritis    Asthma    Bronchitis    Chronic hypertension with superimposed preeclampsia 06/01/2012   Diabetes mellitus (HCC) 02/21/2012   Fibroid    GBS (group B streptococcus) UTI complicating pregnancy 06/04/2012   Needs treatment in labor-resistant to Clinda and Erythromycin!     GERD (gastroesophageal reflux disease)    HTN (hypertension) 02/21/2012   Migraines 02/21/2012   Uterine fibroids affecting pregnancy, antepartum 07/27/2012    Encounter Details:  CNP Questionnaire - 04/08/23 1600       Questionnaire   Ask client: Do you give verbal consent for me to treat you today? Yes    Student Assistance N/A    Location Patient Served  [facility]    Visit Setting with Client Organization    Patient Status Unhoused    Insurance Medicaid    Insurance/Financial Assistance Referral N/A    Medication N/A    Medical Provider Yes    Screening Referrals Made N/A    Medical Referrals Made N/A    Medical Appointment Made N/A    Recently w/o PCP, now 1st time PCP visit completed due to CNs referral or appointment made N/A    Food Have Food Insecurities    Transportation N/A    Housing/Utilities No permanent housing    Interpersonal Safety N/A    Interventions Case Management;Counsel;Educate    Abnormal to Normal Screening Since Last CN Visit N/A    Screenings CN Performed N/A    Sent Client to Lab for: N/A    Did client attend any of the following based off CNs referral or appointments made? N/A    ED Visit Averted N/A    Life-Saving Intervention Made N/A             Says is feeling okay other than some persistent lower back pain. Will have radiofrequency ablation at lumbar area on Friday morning. Will be outpatient. Says her mom will take her and bring her back. They will give  her some valium before the procedure. One side will be done on Friday then the other side 2 weeks later. Will follow-up in a week.  Joya Gaskins, RN, BSN

## 2023-05-01 ENCOUNTER — Other Ambulatory Visit (HOSPITAL_COMMUNITY): Payer: Self-pay | Admitting: Physician Assistant

## 2023-05-01 DIAGNOSIS — F4310 Post-traumatic stress disorder, unspecified: Secondary | ICD-10-CM

## 2023-05-08 ENCOUNTER — Ambulatory Visit: Payer: MEDICAID | Admitting: Cardiology

## 2023-05-08 ENCOUNTER — Other Ambulatory Visit (HOSPITAL_COMMUNITY): Payer: Self-pay | Admitting: Physician Assistant

## 2023-05-08 ENCOUNTER — Encounter: Payer: Self-pay | Admitting: Cardiology

## 2023-05-08 VITALS — BP 128/79 | HR 98 | Resp 16 | Ht 67.0 in | Wt 205.0 lb

## 2023-05-08 DIAGNOSIS — R9431 Abnormal electrocardiogram [ECG] [EKG]: Secondary | ICD-10-CM

## 2023-05-08 DIAGNOSIS — F4310 Post-traumatic stress disorder, unspecified: Secondary | ICD-10-CM

## 2023-05-08 DIAGNOSIS — R072 Precordial pain: Secondary | ICD-10-CM

## 2023-05-08 DIAGNOSIS — E782 Mixed hyperlipidemia: Secondary | ICD-10-CM

## 2023-05-08 DIAGNOSIS — E1165 Type 2 diabetes mellitus with hyperglycemia: Secondary | ICD-10-CM

## 2023-05-08 DIAGNOSIS — I10 Essential (primary) hypertension: Secondary | ICD-10-CM

## 2023-05-08 MED ORDER — ROSUVASTATIN CALCIUM 20 MG PO TABS: 20.0000 mg | ORAL_TABLET | Freq: Every day | ORAL | 3 refills | Status: DC

## 2023-05-08 MED ORDER — ASPIRIN 81 MG PO TBEC: 81.0000 mg | DELAYED_RELEASE_TABLET | Freq: Every day | ORAL | 12 refills | Status: DC

## 2023-05-08 NOTE — Progress Notes (Signed)
ID:  Carla, Brooks 1971/01/27, MRN [id number]  PCP:  Carla Rieger, MD  Cardiologist:  Carla Lerner, DO, [facility] (established care 05/08/23)  REASON FOR CONSULT: HTN and abnormal EKG  REQUESTING PHYSICIAN:  Carla Rieger, MD No address on file  Chief Complaint  Patient presents with   Hypertension   New Patient (Initial Visit)    HPI  Carla Brooks is a 52 y.o. African-American female who presents to the clinic for evaluation of abnormal EKG during her hospitalization and hypertension at the request of Carla Rieger, MD. Her past medical history and cardiovascular risk factors include: Diabetes mellitus type 2, hypertension, CKD stage III.  Patient was referred to practice for evaluation of abnormal EKG and high blood pressure.  Patient says that during her hospitalization in July 2024 she was noted to have an abnormal EKG.  No workup was performed at that time.  Currently she does endorse chest pain. Last episode was yesterday night while she was try to go to sleep. It can occur with exertion.  Exertional discomfort was approximately 2 days ago while going up a flight of stairs. Intensity 7 out of 10. Pressure-like sensation. Lasting for few minutes.  Patient states that she did have a heart catheterization either in 2020/2021 at outside facility I do not have those records available for review (not available in Care Everywhere).  But no coronary interventions.   FUNCTIONAL STATUS: No structured exercise program or daily routine.  Limited by back pain.    CARDIAC DATABASE: EKG: 03/18/2023: Sinus rhythm, 78 bpm, prolonged QT, ST-T wave abnormality consider inferior lateral ischemia.  May 08, 2023: Sinus rhythm, 90 bpm, without underlying ischemic injury pattern.  Echocardiogram: 02/2012 - Left ventricle: The cavity size was normal. Wall thickness   was normal. Systolic function was normal. The estimated   ejection fraction was in the range  of 55% to 60%. Wall   motion was normal; there were no regional wall motion   abnormalities. Left ventricular diastolic function   parameters were normal. - Atrial septum: No defect or patent foramen ovale was   identified.  Stress Testing: No results found for this or any previous visit from the past 1095 days   ALLERGIES: Allergies  Allergen Reactions   Shrimp [Shellfish Allergy] Anaphylaxis and Hives   Hydrocodone Hives   Penicillins Hives and Other (See Comments)    Has patient had a PCN reaction causing immediate rash, facial/tongue/throat swelling, SOB or lightheadedness with hypotension: No Has patient had a PCN reaction causing severe rash involving mucus membranes or skin necrosis: No Has patient had a PCN reaction that required hospitalization No Has patient had a PCN reaction occurring within the last 10 years: No If all of the above answers are "NO", then may proceed with Cephalosporin use.   Pork-Derived Products Other (See Comments)    Reaction:  Increases pts BP    MEDICATION LIST PRIOR TO VISIT: Current Meds  Medication Sig   albuterol (VENTOLIN HFA) 108 (90 Base) MCG/ACT inhaler Inhale 2 puffs into the lungs every 6 (six) hours as needed for shortness of breath.   amLODipine (NORVASC) 5 MG tablet Take 5 mg by mouth daily.   busPIRone (BUSPAR) 15 MG tablet Take 1.5 tablets (22.5 mg total) by mouth 2 (two) times daily.   cariprazine (VRAYLAR) 3 MG capsule Take 1 capsule (3 mg total) by mouth daily.   clonazePAM (KLONOPIN) 1 MG tablet Take 1 mg by mouth 2 (two) times daily as needed  for anxiety.   EPINEPHrine (EPIPEN) 0.3 mg/0.3 mL DEVI Inject 0.3 mLs (0.3 mg total) into the muscle as needed. Use as directed in the event of a severe allergic reaction   gabapentin (NEURONTIN) 300 MG capsule Take 300 mg by mouth 3 (three) times daily.   hydrOXYzine (VISTARIL) 25 MG capsule Take 25 mg by mouth 2 (two) times daily as needed for anxiety.   JARDIANCE 10 MG TABS tablet  Take 10 mg by mouth daily.   labetalol (NORMODYNE) 300 MG tablet Take 300 mg by mouth 2 (two) times daily.   lamoTRIgine (LAMICTAL) 100 MG tablet Take 1 tablet (100 mg total) by mouth 2 (two) times daily.   losartan (COZAAR) 25 MG tablet Take 25 mg by mouth 2 (two) times daily.   meclizine (ANTIVERT) 25 MG tablet Take 1 tablet by mouth as needed.   metFORMIN (GLUCOPHAGE) 500 MG tablet Take 1 tablet (500 mg total) by mouth 2 (two) times daily with a meal.   omeprazole (PRILOSEC) 20 MG capsule Take 20 mg by mouth 2 (two) times daily.   prazosin (MINIPRESS) 2 MG capsule Take 1 capsule (2 mg total) by mouth at bedtime.   prochlorperazine (COMPAZINE) 5 MG tablet Take 1 tablet (5 mg total) by mouth every 6 (six) hours as needed for nausea or vomiting.   sertraline (ZOLOFT) 50 MG tablet Take 1 tablet (50 mg total) by mouth daily.   tiZANidine (ZANAFLEX) 4 MG tablet Take 4 mg by mouth every 6 (six) hours as needed for muscle spasms.   traZODone (DESYREL) 50 MG tablet Take 1 tablet (50 mg total) by mouth at bedtime.     PAST MEDICAL HISTORY: Past Medical History:  Diagnosis Date   Abnormal Pap smear    Anemia 02/21/2012   Arthritis    Asthma    Bronchitis    Chronic hypertension with superimposed preeclampsia 06/01/2012   Diabetes mellitus (HCC) 02/21/2012   Fibroid    GBS (group B streptococcus) UTI complicating pregnancy 06/04/2012   Needs treatment in labor-resistant to Clinda and Erythromycin!     GERD (gastroesophageal reflux disease)    HTN (hypertension) 02/21/2012   Migraines 02/21/2012   Uterine fibroids affecting pregnancy, antepartum 07/27/2012    PAST SURGICAL HISTORY: Past Surgical History:  Procedure Laterality Date   CESAREAN SECTION N/A 10/29/2012   Procedure: CESAREAN SECTION;  Surgeon: Lazaro Arms, MD;  Location: [facility];  Service: Obstetrics;  Laterality: N/A;   CHOLECYSTECTOMY  09/27/2001    FAMILY HISTORY: The patient family history includes Cancer in her maternal  grandfather, paternal grandfather, and paternal uncle; Coronary artery disease in her father; Diabetes in her maternal grandmother; Heart disease in her father.  SOCIAL HISTORY:  The patient  reports that she has never smoked. She has never used smokeless tobacco. She reports that she does not currently use alcohol. She reports that she does not use drugs.  REVIEW OF SYSTEMS: Review of Systems  Cardiovascular:  Positive for chest pain (see HPI) and dyspnea on exertion. Negative for claudication, irregular heartbeat, leg swelling, near-syncope, orthopnea, palpitations, paroxysmal nocturnal dyspnea and syncope.  Respiratory:  Negative for shortness of breath.   Hematologic/Lymphatic: Negative for bleeding problem.  Musculoskeletal:  Negative for muscle cramps and myalgias.  Neurological:  Negative for dizziness and light-headedness.    PHYSICAL EXAM:    05/08/2023    8:45 AM 03/18/2023    7:35 AM 03/18/2023    4:33 AM  Vitals with BMI  Height 5\' 7"   Weight 205 lbs  205 lbs 11 oz  BMI 32.1  32.21  Systolic 128 143 161  Diastolic 79 88 77  Pulse 98 86 86    Physical Exam  Constitutional: No distress.  Age appropriate, hemodynamically stable.   Neck: No JVD present.  Cardiovascular: Normal rate, regular rhythm, S1 normal, S2 normal, intact distal pulses and normal pulses. Exam reveals no gallop, no S3 and no S4.  No murmur heard. Pulmonary/Chest: Effort normal and breath sounds normal. No stridor. She has no wheezes. She has no rales.  Abdominal: Soft. Bowel sounds are normal. She exhibits no distension. There is no abdominal tenderness.  Musculoskeletal:        General: No edema.     Cervical back: Neck supple.  Neurological: She is alert and oriented to person, place, and time. She has intact cranial nerves (2-12).  Skin: Skin is warm and moist.   LABORATORY DATA:    Latest Ref Rng & Units 03/18/2023    2:11 AM 03/17/2023    8:35 AM 02/07/2023    6:20 PM  CBC  WBC 4.0 - 10.5  K/uL 10.4  12.6  9.9   Hemoglobin 12.0 - 15.0 g/dL 09.6  04.5  40.9   Hematocrit 36.0 - 46.0 % 30.9  37.2  33.3   Platelets 150 - 400 K/uL 423  496  416        Latest Ref Rng & Units 03/18/2023   10:27 AM 03/18/2023    2:11 AM 03/17/2023    7:52 PM  CMP  Glucose 70 - 99 mg/dL  811    BUN 6 - 20 mg/dL  21    Creatinine 9.14 - 1.00 mg/dL  7.82    Sodium 956 - 213 mmol/L  138    Potassium 3.5 - 5.1 mmol/L 3.4  2.7  2.8   Chloride 98 - 111 mmol/L  108    CO2 22 - 32 mmol/L  20    Calcium 8.9 - 10.3 mg/dL  8.7    Total Protein 6.5 - 8.1 g/dL  6.1    Total Bilirubin 0.3 - 1.2 mg/dL  0.8    Alkaline Phos 38 - 126 U/L  100    AST 15 - 41 U/L  38    ALT 0 - 44 U/L  94      No results found for: "CHOL", "HDL", "LDLCALC", "LDLDIRECT", "TRIG", "CHOLHDL" No components found for: "NTPROBNP" No results for input(s): "PROBNP" in the last 8760 hours. Recent Labs    03/17/23 1331  TSH 0.745    BMP Recent Labs    02/07/23 1820 03/17/23 0835 03/17/23 1952 03/18/23 0211 03/18/23 1027  NA 140 138  --  138  --   K 3.5 2.6* 2.8* 2.7* 3.4*  CL 103 104  --  108  --   CO2 23 20*  --  20*  --   GLUCOSE 123* 142*  --  101*  --   BUN 17 27*  --  21*  --   CREATININE 1.31* 2.44*  --  1.58*  --   CALCIUM 9.2 9.4  --  8.7*  --   GFRNONAA 50* 24*  --  40*  --     HEMOGLOBIN A1C Lab Results  Component Value Date   HGBA1C 5.9 (H) 03/17/2023   MPG 122.63 03/17/2023   External Labs: Collected: 02/28/2023 available in Care Everywhere. Total cholesterol 183, triglycerides 200, HDL 49, LDL 100.  IMPRESSION:    ICD-10-CM  1. Precordial pain  R07.2 EKG 12-Lead    2. Benign hypertension  I10     3. Mixed hyperlipidemia  E78.2     4. Abnormal EKG  R94.31     5. Type 2 diabetes mellitus with hyperglycemia, without long-term current use of insulin (HCC)  E11.65        RECOMMENDATIONS: Akaysha Renea Wissel is a 52 y.o. African-American female whose past medical history and cardiac risk  factors include: Diabetes mellitus type 2, hypertension, CKD stage III.  Precordial pain Abnormal EKG Precordial discomfort suggestive of cardiac etiology. Recent EKG noted ST-T changes in the inferolateral leads cannot rule out ischemia. Symptoms are more pronounced with over exertional activities. EKG today illustrates sinus rhythm without underlying ischemic injury pattern. Has multiple risk factors including diabetes and therefore recommend ischemic evaluation. Echo will be ordered to evaluate for structural heart disease and left ventricular systolic function. Exercise nuclear stress test.  Evaluate for functional capacity, exercise-induced arrhythmias, and reversible ischemia. Coronary calcium score. Restart aspirin 81 mg p.o. daily until the workup is complete. Patient is made aware of not to overexert herself. Educated on seeking medical attention sooner by going to the closest ER via EMS if the symptoms increase in intensity, frequency, duration, or has typical chest pain as discussed in the office.  Patient verbalized understanding.  Benign hypertension Office blood pressures were well-controlled. Medications reconciled. No changes warranted at this time  Mixed hyperlipidemia Currently not on statin therapy. Outside labs from June 2024 illustrates a LDL of 100 mg/dL and triglycerides of 191 mg/dL. Being a diabetic and given her ASCVD risk score would recommend statin therapy. Will start rosuvastatin 20 mg p.o. nightly. Labs in 6 weeks to reevaluate therapy  Type 2 diabetes mellitus with hyperglycemia, without long-term current use of insulin (HCC) Reemphasized importance of glycemic control.    FINAL MEDICATION LIST END OF ENCOUNTER: No orders of the defined types were placed in this encounter.   There are no discontinued medications.   Current Outpatient Medications:    albuterol (VENTOLIN HFA) 108 (90 Base) MCG/ACT inhaler, Inhale 2 puffs into the lungs every 6  (six) hours as needed for shortness of breath., Disp: , Rfl:    amLODipine (NORVASC) 5 MG tablet, Take 5 mg by mouth daily., Disp: , Rfl:    busPIRone (BUSPAR) 15 MG tablet, Take 1.5 tablets (22.5 mg total) by mouth 2 (two) times daily., Disp: 90 tablet, Rfl: 1   cariprazine (VRAYLAR) 3 MG capsule, Take 1 capsule (3 mg total) by mouth daily., Disp: 30 capsule, Rfl: 1   clonazePAM (KLONOPIN) 1 MG tablet, Take 1 mg by mouth 2 (two) times daily as needed for anxiety., Disp: , Rfl:    EPINEPHrine (EPIPEN) 0.3 mg/0.3 mL DEVI, Inject 0.3 mLs (0.3 mg total) into the muscle as needed. Use as directed in the event of a severe allergic reaction, Disp: 1 Device, Rfl: 3   gabapentin (NEURONTIN) 300 MG capsule, Take 300 mg by mouth 3 (three) times daily., Disp: , Rfl:    hydrOXYzine (VISTARIL) 25 MG capsule, Take 25 mg by mouth 2 (two) times daily as needed for anxiety., Disp: , Rfl:    JARDIANCE 10 MG TABS tablet, Take 10 mg by mouth daily., Disp: , Rfl:    labetalol (NORMODYNE) 300 MG tablet, Take 300 mg by mouth 2 (two) times daily., Disp: , Rfl:    lamoTRIgine (LAMICTAL) 100 MG tablet, Take 1 tablet (100 mg total) by mouth 2 (two) times daily.,  Disp: 60 tablet, Rfl: 1   losartan (COZAAR) 25 MG tablet, Take 25 mg by mouth 2 (two) times daily., Disp: , Rfl:    meclizine (ANTIVERT) 25 MG tablet, Take 1 tablet by mouth as needed., Disp: , Rfl:    metFORMIN (GLUCOPHAGE) 500 MG tablet, Take 1 tablet (500 mg total) by mouth 2 (two) times daily with a meal., Disp: 60 tablet, Rfl: 0   omeprazole (PRILOSEC) 20 MG capsule, Take 20 mg by mouth 2 (two) times daily., Disp: , Rfl:    prazosin (MINIPRESS) 2 MG capsule, Take 1 capsule (2 mg total) by mouth at bedtime., Disp: 30 capsule, Rfl: 1   prochlorperazine (COMPAZINE) 5 MG tablet, Take 1 tablet (5 mg total) by mouth every 6 (six) hours as needed for nausea or vomiting., Disp: 15 tablet, Rfl: 0   sertraline (ZOLOFT) 50 MG tablet, Take 1 tablet (50 mg total) by mouth  daily., Disp: 30 tablet, Rfl: 1   tiZANidine (ZANAFLEX) 4 MG tablet, Take 4 mg by mouth every 6 (six) hours as needed for muscle spasms., Disp: , Rfl:    traZODone (DESYREL) 50 MG tablet, Take 1 tablet (50 mg total) by mouth at bedtime., Disp: 30 tablet, Rfl: 1  Orders Placed This Encounter  Procedures   EKG 12-Lead    There are no Patient Instructions on file for this visit.   --Continue cardiac medications as reconciled in final medication list. --No follow-ups on file. or sooner if needed. --Continue follow-up with your primary care physician regarding the management of your other chronic comorbid conditions.  Patient's questions and concerns were addressed to her satisfaction. She voices understanding of the instructions provided during this encounter.   This note was created using a voice recognition software as a result there may be grammatical errors inadvertently enclosed that do not reflect the nature of this encounter. Every attempt is made to correct such errors.  Carla Brooks, [place], [facility]  Pager:  [phone] Office: [phone]

## 2023-05-12 ENCOUNTER — Other Ambulatory Visit (HOSPITAL_COMMUNITY): Payer: Self-pay | Admitting: Physician Assistant

## 2023-05-12 DIAGNOSIS — F4310 Post-traumatic stress disorder, unspecified: Secondary | ICD-10-CM

## 2023-05-14 ENCOUNTER — Other Ambulatory Visit (HOSPITAL_COMMUNITY): Payer: Self-pay | Admitting: Physician Assistant

## 2023-05-14 DIAGNOSIS — F4310 Post-traumatic stress disorder, unspecified: Secondary | ICD-10-CM

## 2023-05-15 ENCOUNTER — Ambulatory Visit: Payer: MEDICAID

## 2023-05-15 DIAGNOSIS — R072 Precordial pain: Secondary | ICD-10-CM

## 2023-06-03 ENCOUNTER — Telehealth: Payer: Self-pay | Admitting: Cardiology

## 2023-06-03 NOTE — Telephone Encounter (Signed)
Patient returned RN's call regarding test results.

## 2023-06-03 NOTE — Telephone Encounter (Signed)
The patient has been notified of the result and verbalized understanding.  All questions (if any) were answered. Frutoso Schatz, RN 06/03/2023 1:25 PM

## 2023-06-03 NOTE — Telephone Encounter (Signed)
-----   Message from [facility] sent at 06/02/2023 12:44 PM EDT ----- Low risk study. We will review the results at the upcoming office visit.  Regards,   Sunit Leet[place], DO, [facility]

## 2023-06-24 ENCOUNTER — Ambulatory Visit (HOSPITAL_COMMUNITY): Payer: MEDICAID | Attending: Cardiology

## 2023-06-24 DIAGNOSIS — R072 Precordial pain: Secondary | ICD-10-CM

## 2023-06-24 LAB — ECHOCARDIOGRAM COMPLETE
Area-P 1/2: 4.02 cm2
Est EF: 55
S' Lateral: 2.6 cm

## 2023-06-30 ENCOUNTER — Other Ambulatory Visit (HOSPITAL_COMMUNITY): Payer: Self-pay | Admitting: Physician Assistant

## 2023-06-30 DIAGNOSIS — F3132 Bipolar disorder, current episode depressed, moderate: Secondary | ICD-10-CM

## 2023-06-30 DIAGNOSIS — F4310 Post-traumatic stress disorder, unspecified: Secondary | ICD-10-CM

## 2023-07-02 ENCOUNTER — Other Ambulatory Visit: Payer: Self-pay

## 2023-07-02 ENCOUNTER — Encounter: Payer: Self-pay | Admitting: Cardiology

## 2023-07-02 ENCOUNTER — Ambulatory Visit: Payer: MEDICAID | Attending: Cardiology | Admitting: Cardiology

## 2023-07-02 VITALS — BP 146/82 | HR 88 | Resp 16 | Ht 67.0 in | Wt 207.0 lb

## 2023-07-02 DIAGNOSIS — E782 Mixed hyperlipidemia: Secondary | ICD-10-CM

## 2023-07-02 DIAGNOSIS — E1165 Type 2 diabetes mellitus with hyperglycemia: Secondary | ICD-10-CM

## 2023-07-02 DIAGNOSIS — R0609 Other forms of dyspnea: Secondary | ICD-10-CM

## 2023-07-02 DIAGNOSIS — R072 Precordial pain: Secondary | ICD-10-CM

## 2023-07-02 DIAGNOSIS — I10 Essential (primary) hypertension: Secondary | ICD-10-CM

## 2023-07-02 DIAGNOSIS — R9431 Abnormal electrocardiogram [ECG] [EKG]: Secondary | ICD-10-CM

## 2023-07-02 NOTE — Progress Notes (Signed)
Cardiology Office Note:  .   Date:  07/02/2023  ID:  Carla Brooks, DOB 1970-12-20, MRN [id number] PCP:  Vernona Rieger, MD  Former Cardiology Providers: None [facility] Providers Cardiologist:  Tessa Lerner, DO , [facility] (established care 05/08/2023) Electrophysiologist:  None  Click to update primary MD,subspecialty MD or APP then REFRESH:1}    Chief Complaint  Patient presents with  . Results  . Follow-up    History of Present Illness: .   Carla Brooks is a 52 y.o. African-American female whose past medical history and cardiovascular risk factors includes: Diabetes mellitus type 2, hypertension, CKD stage III.   Patient was referred to the practice due to abnormal EKG and elevated blood pressures.  During last office visit patient endorsed precordial discomfort suggestive of cardiac etiology, ST-T changes in inferolateral leads were noted on surface EKG, shared decision was to proceed with echo, stress test, coronary calcium score.  Results reviewed with her in detail and noted below for further reference.  Clinically, denies precordial chest pain.  However does endorse shortness of breath with effort related activities but overall intensity frequency and duration remains stable.  She denies orthopnea or PND.  Home blood pressures are well-controlled around 120 mmHg.  They are well-controlled with last office visit as well.  Review of Systems: .   Review of Systems  Cardiovascular:  Positive for dyspnea on exertion. Negative for chest pain, claudication, irregular heartbeat, leg swelling, near-syncope, orthopnea, palpitations, paroxysmal nocturnal dyspnea and syncope.  Respiratory:  Negative for shortness of breath.   Hematologic/Lymphatic: Negative for bleeding problem.    Studies Reviewed:   EKG: May 08, 2023: Sinus rhythm, 90 bpm, without underlying ischemic injury pattern.  Echocardiogram: 06/24/2023: LVEF 55%, moderate LVH, grade 1  diastolic dysfunction. Global longitudinal strain -21.9%. Normal right ventricular size and function. Trivial MR. Mildly dilated pulmonary artery. Estimated RAP 3 mmHg.   Stress Testing: Regadenoson (with Mod Bruce protocol) Nuclear stress test 05/15/2023: There is mild gut uptake artifact in the inferior wall. Myocardial perfusion is without ischemia or scar.  Overall LV systolic function is normal without regional wall motion abnormalities.  Stress LV EF: 50%.  Nondiagnostic ECG stress due to pharmacologic stress with mod Bruce protocol. Resting EKG demonstrated normal sinus rhythm. Non-specific ST-T abnormality. Peak EKG revealed less than 1 mm ST depression of the inferolateral leads at peak exercise and 72% MPHR. No chest pain. Symptoms included dyspnea. Normal BP response.  No previous exam available for comparison. Low risk.   RADIOLOGY: N/A  Risk Assessment/Calculations:   N/A   Labs:       Latest Ref Rng & Units 03/18/2023    2:11 AM 03/17/2023    8:35 AM 02/07/2023    6:20 PM  CBC  WBC 4.0 - 10.5 K/uL 10.4  12.6  9.9   Hemoglobin 12.0 - 15.0 g/dL 13.0  86.5  78.4   Hematocrit 36.0 - 46.0 % 30.9  37.2  33.3   Platelets 150 - 400 K/uL 423  496  416        Latest Ref Rng & Units 03/18/2023   10:27 AM 03/18/2023    2:11 AM 03/17/2023    7:52 PM  BMP  Glucose 70 - 99 mg/dL  696    BUN 6 - 20 mg/dL  21    Creatinine 2.95 - 1.00 mg/dL  2.84    Sodium 132 - 440 mmol/L  138    Potassium 3.5 - 5.1 mmol/L 3.4  2.7  2.8   Chloride 98 - 111 mmol/L  108    CO2 22 - 32 mmol/L  20    Calcium 8.9 - 10.3 mg/dL  8.7        Latest Ref Rng & Units 03/18/2023   10:27 AM 03/18/2023    2:11 AM 03/17/2023    7:52 PM  CMP  Glucose 70 - 99 mg/dL  213    BUN 6 - 20 mg/dL  21    Creatinine 0.86 - 1.00 mg/dL  5.78    Sodium 469 - 629 mmol/L  138    Potassium 3.5 - 5.1 mmol/L 3.4  2.7  2.8   Chloride 98 - 111 mmol/L  108    CO2 22 - 32 mmol/L  20    Calcium 8.9 - 10.3 mg/dL  8.7     Total Protein 6.5 - 8.1 g/dL  6.1    Total Bilirubin 0.3 - 1.2 mg/dL  0.8    Alkaline Phos 38 - 126 U/L  100    AST 15 - 41 U/L  38    ALT 0 - 44 U/L  94      No results found for: "CHOL", "HDL", "LDLCALC", "LDLDIRECT", "TRIG", "CHOLHDL" No results for input(s): "LIPOA" in the last 8760 hours. No components found for: "NTPROBNP" No results for input(s): "PROBNP" in the last 8760 hours. Recent Labs    03/17/23 1331  TSH 0.745    Physical Exam:    Today's Vitals   07/02/23 0840 07/02/23 0901  BP: (!) 164/98 (!) 146/82  Pulse: 85 88  Resp: 16   SpO2: 95%   Weight: 207 lb (93.9 kg)   Height: 5\' 7"  (1.702 m)    Body mass index is 32.42 kg/m. Wt Readings from Last 3 Encounters:  07/02/23 207 lb (93.9 kg)  05/08/23 205 lb (93 kg)  03/18/23 205 lb 11 oz (93.3 kg)    Physical Exam  Constitutional: No distress.  Age appropriate, hemodynamically stable.   Neck: No JVD present.  Cardiovascular: Normal rate, regular rhythm, S1 normal, S2 normal, intact distal pulses and normal pulses. Exam reveals no gallop, no S3 and no S4.  No murmur heard. Pulmonary/Chest: Effort normal and breath sounds normal. No stridor. She has no wheezes. She has no rales.  Abdominal: Soft. Bowel sounds are normal. She exhibits no distension. There is no abdominal tenderness.  Musculoskeletal:        General: No edema.     Cervical back: Neck supple.  Neurological: She is alert and oriented to person, place, and time. She has intact cranial nerves (2-12).  Skin: Skin is warm and moist.   Impression & Recommendation(s):  Impression:   ICD-10-CM   1. Precordial pain  R07.2     2. Dyspnea on exertion  R06.09     3. Benign hypertension  I10     4. Mixed hyperlipidemia  E78.2     5. Abnormal EKG  R94.31     6. Type 2 diabetes mellitus with hyperglycemia, without long-term current use of insulin (HCC)  E11.65        Recommendation(s):  Precordial pain Dyspnea on exertion No reoccurrence of  chest pain. Dyspnea on exertion remains relatively stable-likely secondary to elevated blood pressures. Echocardiogram notes normal LVEF, normal diastolic dysfunction, no significant valvular heart disease. Nuclear stress test: Low risk study Coronary calcium score was not done. Blood pressure at today's office visit was elevated on arrival-improved at the end of the  visit. Monitor for now.  Benign hypertension Office blood pressures today are not well-controlled. However patient states home blood pressures are pretty well-controlled.  No blood pressure log available for review.  For now and have asked her to keep a log of her blood pressure to see if further medication titration is warranted. Patient will follow-up with APP in 6 to 8 weeks to reevaluate antihypertensive medications (patient was referred to cardiology for blood pressure management in the past) but no changes have been made as she was well controlled at last OV.  Reemphasized importance of low-salt diet.  Mixed hyperlipidemia Started on rosuvastatin 20 mg p.o. nightly back in September 2024 Has tolerated the medication well. Will check fasting lipid profile and direct LDL.  Type 2 diabetes mellitus with hyperglycemia, without long-term current use of insulin (HCC) Reemphasized the importance of glycemic control Continue Jardiance 10 mg p.o. daily. Continue losartan 12.5 mg p.o. twice daily. Continue rosuvastatin 20 mg p.o. nightly  Orders Placed:  No orders of the defined types were placed in this encounter.  As part of medical decision making echo, stress test results reviewed as part of today's office visit.  Final Medication List:   No orders of the defined types were placed in this encounter.   There are no discontinued medications.   Current Outpatient Medications:  .  albuterol (VENTOLIN HFA) 108 (90 Base) MCG/ACT inhaler, Inhale 2 puffs into the lungs every 6 (six) hours as needed for shortness of breath.,  Disp: , Rfl:  .  amLODipine (NORVASC) 5 MG tablet, Take 5 mg by mouth daily., Disp: , Rfl:  .  aspirin EC 81 MG tablet, Take 1 tablet (81 mg total) by mouth daily. Swallow whole., Disp: 30 tablet, Rfl: 12 .  busPIRone (BUSPAR) 15 MG tablet, Take 1.5 tablets (22.5 mg total) by mouth 2 (two) times daily., Disp: 90 tablet, Rfl: 1 .  cariprazine (VRAYLAR) 3 MG capsule, Take 1 capsule (3 mg total) by mouth daily., Disp: 30 capsule, Rfl: 1 .  clonazePAM (KLONOPIN) 1 MG tablet, Take 1 mg by mouth 2 (two) times daily as needed for anxiety., Disp: , Rfl:  .  EPINEPHrine (EPIPEN) 0.3 mg/0.3 mL DEVI, Inject 0.3 mLs (0.3 mg total) into the muscle as needed. Use as directed in the event of a severe allergic reaction, Disp: 1 Device, Rfl: 3 .  gabapentin (NEURONTIN) 300 MG capsule, Take 300 mg by mouth 3 (three) times daily., Disp: , Rfl:  .  hydrOXYzine (VISTARIL) 25 MG capsule, Take 25 mg by mouth 2 (two) times daily as needed for anxiety., Disp: , Rfl:  .  JARDIANCE 10 MG TABS tablet, Take 10 mg by mouth daily., Disp: , Rfl:  .  labetalol (NORMODYNE) 300 MG tablet, Take 300 mg by mouth 2 (two) times daily., Disp: , Rfl:  .  lamoTRIgine (LAMICTAL) 100 MG tablet, Take 1 tablet (100 mg total) by mouth 2 (two) times daily., Disp: 60 tablet, Rfl: 1 .  losartan (COZAAR) 25 MG tablet, Take 12.5 mg by mouth 2 (two) times daily., Disp: , Rfl:  .  meclizine (ANTIVERT) 25 MG tablet, Take 1 tablet by mouth as needed., Disp: , Rfl:  .  omeprazole (PRILOSEC) 20 MG capsule, Take 20 mg by mouth 2 (two) times daily., Disp: , Rfl:  .  prazosin (MINIPRESS) 2 MG capsule, Take 1 capsule (2 mg total) by mouth at bedtime., Disp: 30 capsule, Rfl: 1 .  prochlorperazine (COMPAZINE) 5 MG tablet, Take 1 tablet (5  mg total) by mouth every 6 (six) hours as needed for nausea or vomiting., Disp: 15 tablet, Rfl: 0 .  rosuvastatin (CRESTOR) 20 MG tablet, Take 1 tablet (20 mg total) by mouth daily., Disp: 90 tablet, Rfl: 3 .  sertraline  (ZOLOFT) 50 MG tablet, Take 1 tablet (50 mg total) by mouth daily., Disp: 30 tablet, Rfl: 1 .  tiZANidine (ZANAFLEX) 4 MG tablet, Take 4 mg by mouth every 6 (six) hours as needed for muscle spasms., Disp: , Rfl:  .  traZODone (DESYREL) 50 MG tablet, Take 1 tablet (50 mg total) by mouth at bedtime., Disp: 30 tablet, Rfl: 1 .  metFORMIN (GLUCOPHAGE) 500 MG tablet, Take 1 tablet (500 mg total) by mouth 2 (two) times daily with a meal. (Patient taking differently: Take 0.5 tablets by mouth 2 (two) times daily with a meal.), Disp: 60 tablet, Rfl: 0  Consent:   NA  Disposition:   1 year sooner if needed  8 weeks w/ APP for BP management.  Patient may be asked to follow-up sooner based on the results of the above-mentioned testing.  Her questions and concerns were addressed to her satisfaction. She voices understanding of the recommendations provided during this encounter.    Signed, Tessa Lerner, DO, [facility]  [facility]  [address] [place], [place] [address] 07/02/2023 10:47 AM

## 2023-07-02 NOTE — Patient Instructions (Signed)
Medication Instructions:  Your physician recommends that you continue on your current medications as directed. Please refer to the Current Medication list given to you today.  *If you need a refill on your cardiac medications before your next appointment, please call your pharmacy*  Lab Work: None ordered today. If you have labs (blood work) drawn today and your tests are completely normal, you will receive your results only by: MyChart Message (if you have MyChart) OR A paper copy in the mail If you have any lab test that is abnormal or we need to change your treatment, we will call you to review the results.  Testing/Procedures: None ordered today.  Follow-Up: At [facility], you and your health needs are our priority.  As part of our continuing mission to provide you with exceptional heart care, we have created designated Provider Care Teams.  These Care Teams include your primary Cardiologist (physician) and Advanced Practice Providers (APPs -  Physician Assistants and Nurse Practitioners) who all work together to provide you with the care you need, when you need it.  We recommend signing up for the patient portal called "MyChart".  Sign up information is provided on this After Visit Summary.  MyChart is used to connect with patients for Virtual Visits (Telemedicine).  Patients are able to view lab/test results, encounter notes, upcoming appointments, etc.  Non-urgent messages can be sent to your provider as well.   To learn more about what you can do with MyChart, go to [email or web address].    Your next appointment:   8 week(s) for blood pressure check/ management  The format for your next appointment:   In Person  Provider:   Jari Favre, PA-C, Ronie Spies, PA-C, Robin Searing, NP, Eligha Bridegroom, NP, Tereso Newcomer, PA-C, or Perlie Gold, PA-C. Then, [employer], DO will plan to see you again in 1 year(s).{

## 2023-07-03 ENCOUNTER — Ambulatory Visit: Payer: MEDICAID | Admitting: Cardiology

## 2023-07-03 LAB — LAB REPORT - SCANNED
A1c: 6.2
EGFR: 65

## 2023-07-06 NOTE — Progress Notes (Signed)
External Labs: Collected 02/28/2023 provided by Dr. Chestine Spore: Total cholesterol 183, triglycerides 200, HDL 49, LDL calculated 100  Collected: 07/03/2023 provided by Dr. Chestine Spore. BUN 12, creatinine 1.05. eGFR 65. Potassium 3.8. AST and ALT within normal limits. Total cholesterol 66, triglycerides 68, HDL 34, LDL calculated 16.  Please have her reduce rosuvastatin to 10 mg p.o. nightly.  Update medical records.  Carla Brooks Milan, DO, [facility]

## 2023-07-07 ENCOUNTER — Other Ambulatory Visit: Payer: Self-pay | Admitting: *Deleted

## 2023-07-07 DIAGNOSIS — E782 Mixed hyperlipidemia: Secondary | ICD-10-CM

## 2023-07-07 MED ORDER — ROSUVASTATIN CALCIUM 20 MG PO TABS
10.0000 mg | ORAL_TABLET | Freq: Every day | ORAL | Status: DC
Start: 2023-07-07 — End: 2023-12-09

## 2023-07-10 ENCOUNTER — Encounter (HOSPITAL_COMMUNITY): Payer: Self-pay | Admitting: Physician Assistant

## 2023-07-10 ENCOUNTER — Ambulatory Visit (HOSPITAL_COMMUNITY): Payer: MEDICAID | Admitting: Physician Assistant

## 2023-07-10 DIAGNOSIS — F411 Generalized anxiety disorder: Secondary | ICD-10-CM

## 2023-07-10 DIAGNOSIS — F3132 Bipolar disorder, current episode depressed, moderate: Secondary | ICD-10-CM

## 2023-07-10 DIAGNOSIS — F4310 Post-traumatic stress disorder, unspecified: Secondary | ICD-10-CM

## 2023-07-10 MED ORDER — SERTRALINE HCL 50 MG PO TABS
50.0000 mg | ORAL_TABLET | Freq: Every day | ORAL | 1 refills | Status: DC
Start: 2023-07-10 — End: 2023-12-15

## 2023-07-10 MED ORDER — LAMOTRIGINE 100 MG PO TABS
100.0000 mg | ORAL_TABLET | Freq: Two times a day (BID) | ORAL | 1 refills | Status: DC
Start: 2023-07-10 — End: 2024-02-03

## 2023-07-10 MED ORDER — BUSPIRONE HCL 15 MG PO TABS
22.5000 mg | ORAL_TABLET | Freq: Two times a day (BID) | ORAL | 1 refills | Status: DC
Start: 2023-07-10 — End: 2023-12-15

## 2023-07-10 MED ORDER — CARIPRAZINE HCL 3 MG PO CAPS
3.0000 mg | ORAL_CAPSULE | Freq: Every day | ORAL | 1 refills | Status: DC
Start: 2023-07-10 — End: 2023-12-15

## 2023-07-10 MED ORDER — HYDROXYZINE PAMOATE 25 MG PO CAPS
25.0000 mg | ORAL_CAPSULE | Freq: Two times a day (BID) | ORAL | 1 refills | Status: DC | PRN
Start: 2023-07-10 — End: 2023-12-15

## 2023-07-10 MED ORDER — PRAZOSIN HCL 1 MG PO CAPS
ORAL_CAPSULE | ORAL | 1 refills | Status: DC
Start: 2023-07-10 — End: 2023-12-15

## 2023-07-10 MED ORDER — CLONAZEPAM 0.5 MG PO TABS
1.0000 mg | ORAL_TABLET | Freq: Two times a day (BID) | ORAL | 0 refills | Status: AC | PRN
Start: 2023-07-10 — End: ?

## 2023-07-10 NOTE — Progress Notes (Signed)
BH MD/PA/NP OP Progress Note  07/10/2023 9:26 PM Carla Brooks  MRN:  [id number]  Chief Complaint:  Chief Complaint  Patient presents with   Follow-up   Medication Refill   HPI:   Carla Brooks is a 52 year old, African-American female with a past psychiatric history significant for generalized anxiety disorder, bipolar disorder (depressed, moderate), and PTSD who presents to [facility] for follow up and medication management.  Patient was last seen by this provider on 02/06/2023.  During her last encounter, patient was being managed on the following psychiatric medications:  Sertraline 50 mg daily Lamotrigine 100 mg 2 times daily Cariprazine 3 mg daily Trazodone 50 mg at bedtime Buspirone 22.5 mg 2 times daily Prazosin 2 mg at bedtime  The patient presents to the encounter wanting to be placed back on her previously prescribed psychiatric medications.  Patient reports that she has not been on her medications for roughly a month.  She reports that she has discontinued taking trazodone due to the medication making her feel sick.  She reports no issues with her current medication regimen and would like to get back on them.  Patient reports that she was previously on Zoloft and clonazepam and states that she would like to be on those medications especially.  Patient endorses depression and rates her depression as 7 out of 10 with 10 being most severe.  She endorses depressive episodes 4 to 5 days out of the week with symptoms lasting the whole day.  Patient endorses the following depressive symptoms: feelings of sadness, lack of motivation, decreased concentration, irritability, fatigue, decreased sleep, feelings of guilt, and hopelessness.  Patient denies worthlessness.  Patient also endorses elevated anxiety and rates her anxiety at 10 out of 10.  Patient's main stressor revolves around her finances as well as anxiety over someone coming into her  house.  A PHQ-9 screen was performed with the patient scoring a 15.  A GAD-7 screen was also performed with the patient scoring a 16.  Patient is alert and oriented x 4, calm, cooperative, and fully engaged in conversation during the encounter.  Patient describes her mood as irritable, agitated, and frustrated.  Patient denies suicidal or homicidal ideations.  She further denies auditory or visual hallucinations and does not appear to be responding to internal/external stimuli.  Patient endorses poor sleep stating that she often goes to bed at 10 PM and wakes up at 1 AM.  Patient endorses fair appetite and eats on average 2 meals per day.  Patient denies alcohol consumption, tobacco use, or illicit drug use.  Visit Diagnosis:    ICD-10-CM   1. GAD (generalized anxiety disorder)  F41.1 sertraline (ZOLOFT) 50 MG tablet    busPIRone (BUSPAR) 15 MG tablet    clonazePAM (KLONOPIN) 0.5 MG tablet    hydrOXYzine (VISTARIL) 25 MG capsule    2. Bipolar 1 disorder, depressed, moderate (HCC)  F31.32 sertraline (ZOLOFT) 50 MG tablet    cariprazine (VRAYLAR) 3 MG capsule    lamoTRIgine (LAMICTAL) 100 MG tablet    3. PTSD (post-traumatic stress disorder)  F43.10 sertraline (ZOLOFT) 50 MG tablet    prazosin (MINIPRESS) 1 MG capsule      Past Psychiatric History:  Patient endorses a past psychiatric history significant for bipolar disorder (bipolar 1 versus bipolar 2), generalized anxiety disorder, depression, and PTSD   Patient denies a past history of hospitalization due to mental health   Patient denies a past history of suicide attempt  Patient denies a past history of homicide attempt  Past Medical History:  Past Medical History:  Diagnosis Date   Abnormal Pap smear    Anemia 02/21/2012   Arthritis    Asthma    Bronchitis    Chronic hypertension with superimposed preeclampsia 06/01/2012   Diabetes mellitus (HCC) 02/21/2012   Fibroid    GBS (group B streptococcus) UTI complicating  pregnancy 06/04/2012   Needs treatment in labor-resistant to Clinda and Erythromycin!     GERD (gastroesophageal reflux disease)    HTN (hypertension) 02/21/2012   Migraines 02/21/2012   Uterine fibroids affecting pregnancy, antepartum 07/27/2012    Past Surgical History:  Procedure Laterality Date   CESAREAN SECTION N/A 10/29/2012   Procedure: CESAREAN SECTION;  Surgeon: Lazaro Arms, MD;  Location: [facility];  Service: Obstetrics;  Laterality: N/A;   CHOLECYSTECTOMY  09/27/2001    Family Psychiatric History:  Uncle - schizophrenia, patient reports that he was taking lithium   Family history of suicide attempts: Patient denies Family history of homicide attempts: Patient denies Family history of substance abuse: Patient reports that her son uses marijuana  Family History:  Family History  Problem Relation Age of Onset   Heart disease Father    Coronary artery disease Father    Cancer Paternal Uncle        throat   Diabetes Maternal Grandmother    Cancer Maternal Grandfather        prostate   Cancer Paternal Grandfather        lung    Social History:  Social History   Socioeconomic History   Marital status: Single    Spouse name: Not on file   Number of children: 3   Years of education: Not on file   Highest education level: Not on file  Occupational History   Not on file  Tobacco Use   Smoking status: Never   Smokeless tobacco: Never  Vaping Use   Vaping status: Never Used  Substance and Sexual Activity   Alcohol use: Not Currently    Comment: occasional   Drug use: No   Sexual activity: Not Currently    Birth control/protection: None  Other Topics Concern   Not on file  Social History Narrative   Not on file   Social Determinants of Health   Financial Resource Strain: Medium Risk (05/03/2023)   Received from [facility]   Overall Financial Resource Strain (CARDIA)    Difficulty of Paying Living Expenses: Somewhat hard  Food Insecurity: Food  Insecurity Present (05/03/2023)   Received from [facility]   Hunger Vital Sign    Worried About Running Out of Food in the Last Year: Often true    Ran Out of Food in the Last Year: Often true  Transportation Needs: Unmet Transportation Needs (05/03/2023)   Received from [facility] - Transportation    Lack of Transportation (Medical): No    Lack of Transportation (Non-Medical): Yes  Physical Activity: Inactive (05/03/2023)   Received [facility]   Exercise Vital Sign    Days of Exercise per Week: 7 days    Minutes of Exercise per Session: 0 min  Stress: Stress Concern Present (05/03/2023)   Received from [facility] - Occupational Stress Questionnaire    Feeling of Stress : Very much  Social Connections: Socially Integrated (05/03/2023)   Received from [facility]   Social Network    How would you rate  your social network (family, work, friends)?: Good participation with social networks    Allergies:  Allergies  Allergen Reactions   Shrimp [Shellfish Allergy] Anaphylaxis and Hives   Hydrocodone Hives   Penicillins Hives and Other (See Comments)    Has patient had a PCN reaction causing immediate rash, facial/tongue/throat swelling, SOB or lightheadedness with hypotension: No Has patient had a PCN reaction causing severe rash involving mucus membranes or skin necrosis: No Has patient had a PCN reaction that required hospitalization No Has patient had a PCN reaction occurring within the last 10 years: No If all of the above answers are "NO", then may proceed with Cephalosporin use.   Pork-Derived Products Other (See Comments)    Reaction:  Increases pts BP    Metabolic Disorder Labs: Lab Results  Component Value Date   HGBA1C 5.9 (H) 03/17/2023   MPG 122.63 03/17/2023   MPG 120 (H) 06/01/2012   No results found for: "PROLACTIN" No results found for: "CHOL", "TRIG", "HDL", "CHOLHDL", "VLDL", "LDLCALC" Lab  Results  Component Value Date   TSH 0.745 03/17/2023   TSH 0.667 06/01/2012    Therapeutic Level Labs: No results found for: "LITHIUM" No results found for: "VALPROATE" No results found for: "CBMZ"  Current Medications: Current Outpatient Medications  Medication Sig Dispense Refill   albuterol (VENTOLIN HFA) 108 (90 Base) MCG/ACT inhaler Inhale 2 puffs into the lungs every 6 (six) hours as needed for shortness of breath.     amLODipine (NORVASC) 5 MG tablet Take 5 mg by mouth daily.     aspirin EC 81 MG tablet Take 1 tablet (81 mg total) by mouth daily. Swallow whole. 30 tablet 12   busPIRone (BUSPAR) 15 MG tablet Take 1.5 tablets (22.5 mg total) by mouth 2 (two) times daily. 90 tablet 1   cariprazine (VRAYLAR) 3 MG capsule Take 1 capsule (3 mg total) by mouth daily. 30 capsule 1   clonazePAM (KLONOPIN) 0.5 MG tablet Take 2 tablets (1 mg total) by mouth 2 (two) times daily as needed for anxiety. 60 tablet 0   EPINEPHrine (EPIPEN) 0.3 mg/0.3 mL DEVI Inject 0.3 mLs (0.3 mg total) into the muscle as needed. Use as directed in the event of a severe allergic reaction 1 Device 3   gabapentin (NEURONTIN) 300 MG capsule Take 300 mg by mouth 3 (three) times daily.     hydrOXYzine (VISTARIL) 25 MG capsule Take 1 capsule (25 mg total) by mouth 2 (two) times daily as needed for anxiety. 60 capsule 1   JARDIANCE 10 MG TABS tablet Take 10 mg by mouth daily.     labetalol (NORMODYNE) 300 MG tablet Take 300 mg by mouth 2 (two) times daily.     lamoTRIgine (LAMICTAL) 100 MG tablet Take 1 tablet (100 mg total) by mouth 2 (two) times daily. 60 tablet 1   losartan (COZAAR) 25 MG tablet Take 12.5 mg by mouth 2 (two) times daily.     meclizine (ANTIVERT) 25 MG tablet Take 1 tablet by mouth as needed.     metFORMIN (GLUCOPHAGE) 500 MG tablet Take 1 tablet (500 mg total) by mouth 2 (two) times daily with a meal. (Patient taking differently: Take 0.5 tablets by mouth 2 (two) times daily with a meal.) 60 tablet 0    omeprazole (PRILOSEC) 20 MG capsule Take 20 mg by mouth 2 (two) times daily.     prazosin (MINIPRESS) 1 MG capsule Take 1 capsule (1 mg total) by mouth at bedtime for 6 days, THEN  2 capsules (2 mg total) at bedtime. 60 capsule 1   prochlorperazine (COMPAZINE) 5 MG tablet Take 1 tablet (5 mg total) by mouth every 6 (six) hours as needed for nausea or vomiting. 15 tablet 0   rosuvastatin (CRESTOR) 20 MG tablet Take 0.5 tablets (10 mg total) by mouth daily.     sertraline (ZOLOFT) 50 MG tablet Take 1 tablet (50 mg total) by mouth daily. 30 tablet 1   tiZANidine (ZANAFLEX) 4 MG tablet Take 4 mg by mouth every 6 (six) hours as needed for muscle spasms.     No current facility-administered medications for this visit.     Musculoskeletal: Strength & Muscle Tone: within normal limits Gait & Station: normal Patient leans: N/A  Psychiatric Specialty Exam: Review of Systems  Psychiatric/Behavioral:  Positive for sleep disturbance. Negative for decreased concentration, dysphoric mood, hallucinations, self-injury and suicidal ideas. The patient is nervous/anxious. The patient is not hyperactive.     Blood pressure (!) 154/84, pulse 86, temperature 98.2 F (36.8 C), temperature source Oral, height 5\' 7"  (1.702 m), weight 209 lb 3.2 oz (94.9 kg), last menstrual period 04/02/2017, SpO2 99%, unknown if currently breastfeeding.Body mass index is 32.77 kg/m.  General Appearance: Casual  Eye Contact:  Good  Speech:  Clear and Coherent and Normal Rate  Volume:  Normal  Mood:  Anxious and Depressed  Affect:  Appropriate  Thought Process:  Coherent, Goal Directed, and Descriptions of Associations: Intact  Orientation:  Full (Time, Place, and Person)  Thought Content: WDL   Suicidal Thoughts:  No  Homicidal Thoughts:  No  Memory:  Immediate;   Good Recent;   Good Remote;   Fair  Judgement:  Good  Insight:  Good  Psychomotor Activity:  Normal  Concentration:  Concentration: Good and Attention Span:  Good  Recall:  Good  Fund of Knowledge: Good  Language: Good  Akathisia:  No  Handed:  Right  AIMS (if indicated): not done  Assets:  Communication Skills Desire for Improvement Social Support  ADL's:  Intact  Cognition: WNL  Sleep:  Fair   Screenings: GAD-7    Flowsheet Row Clinical Support from 07/10/2023 in [facility] Clinical Support from 02/06/2023 in [facility] Office Visit from 12/26/2022 in [facility] Counselor from 12/11/2022 in [facility]  Total GAD-7 Score 16 19 19 18       PHQ2-9    Flowsheet Row Clinical Support from 07/10/2023 in [facility] Clinical Support from 02/06/2023 in [facility] Office Visit from 12/26/2022 in [facility] Counselor from 12/11/2022 in [facility]  PHQ-2 Total Score 6 4 6 3   PHQ-9 Total Score 15 17 23 16       Flowsheet Row Clinical Support from 07/10/2023 in [facility] ED to Hosp-Admission (Discharged) from 03/17/2023 in [facility] ED from 02/07/2023 in [facility] Emergency Department at [facility]  C-SSRS RISK CATEGORY No Risk No Risk No Risk        Assessment and Plan:   Carla Brooks is a 52 year old, African-American female with a past psychiatric history significant for generalized anxiety disorder, bipolar disorder (depressed, moderate), and PTSD who presents to [facility] for follow up and medication management.  Patient presents today encounter requesting to be placed back on her previously prescribed psychiatric medications.  Patient reports  that she is no longer taking trazodone at this time due to the medication causing her to feel safe.  In addition to being placed back on her previously prescribed  medications, patient states that she would like to be especially back on Zoloft and clonazepam.  During her last encounter with this provider, patient was taking clonazepam 0.5 mg 2 times daily as needed for the management of her anxiety.  Patient continues to endorse ongoing depression as well as elevated anxiety attributed to stressors in her life.  Patient to be placed back on her previously prescribed psychiatric medications.  Patient will be placed back on Zoloft 50 mg daily for the management of her depressive symptoms and anxiety.  Patient to be placed back on clonazepam for the management of her anxiety.  Patient to be placed on prazosin 1 mg for 6 days followed by 2 mg at bedtime for the management of her PTSD.  Patient was agreeable to recommendations.  Patient's medications to be e-prescribed through pharmacy of choice.  Collaboration of Care: Collaboration of Care: Medication Management AEB patient being followed by mental health provider at this facility, Psychiatrist AEB provider managing patient's psychiatric medications, and Referral or follow-up with counselor/therapist AEB patient being seen by a licensed clinical social worker at this facility  Patient/Guardian was advised Release of Information must be obtained prior to any record release in order to collaborate their care with an outside provider. Patient/Guardian was advised if they have not already done so to contact the registration department to sign all necessary forms in order for [place] to release information regarding their care.   Consent: Patient/Guardian gives verbal consent for treatment and assignment of benefits for services provided during this visit. Patient/Guardian expressed understanding and agreed to proceed.   1. GAD (generalized anxiety disorder)  - sertraline (ZOLOFT) 50 MG tablet; Take 1 tablet (50 mg total) by mouth daily.  Dispense: 30 tablet; Refill: 1 - busPIRone (BUSPAR) 15 MG tablet; Take 1.5 tablets (22.5  mg total) by mouth 2 (two) times daily.  Dispense: 90 tablet; Refill: 1 - clonazePAM (KLONOPIN) 0.5 MG tablet; Take 2 tablets (1 mg total) by mouth 2 (two) times daily as needed for anxiety.  Dispense: 60 tablet; Refill: 0 - hydrOXYzine (VISTARIL) 25 MG capsule; Take 1 capsule (25 mg total) by mouth 2 (two) times daily as needed for anxiety.  Dispense: 60 capsule; Refill: 1  2. Bipolar 1 disorder, depressed, moderate (HCC)  - sertraline (ZOLOFT) 50 MG tablet; Take 1 tablet (50 mg total) by mouth daily.  Dispense: 30 tablet; Refill: 1 - cariprazine (VRAYLAR) 3 MG capsule; Take 1 capsule (3 mg total) by mouth daily.  Dispense: 30 capsule; Refill: 1 - lamoTRIgine (LAMICTAL) 100 MG tablet; Take 1 tablet (100 mg total) by mouth 2 (two) times daily.  Dispense: 60 tablet; Refill: 1  3. PTSD (post-traumatic stress disorder)  - sertraline (ZOLOFT) 50 MG tablet; Take 1 tablet (50 mg total) by mouth daily.  Dispense: 30 tablet; Refill: 1 - prazosin (MINIPRESS) 1 MG capsule; Take 1 capsule (1 mg total) by mouth at bedtime for 6 days, THEN 2 capsules (2 mg total) at bedtime.  Dispense: 60 capsule; Refill: 1  Patient to follow-up in 6 weeks Provider spent a total of 17 minutes with the patient/reviewing patient's chart  Meta Hatchet, PA 07/10/2023, 9:26 PM

## 2023-08-22 ENCOUNTER — Encounter (HOSPITAL_COMMUNITY): Payer: Self-pay

## 2023-08-22 ENCOUNTER — Encounter (HOSPITAL_COMMUNITY): Payer: MEDICAID | Admitting: Physician Assistant

## 2023-09-03 NOTE — Progress Notes (Deleted)
 Cardiology Office Note    Patient Name: Carla Brooks Date of Encounter: 09/03/2023  Primary Care Provider:  Gretta Crank, MD Primary Cardiologist:  Madonna Large, DO Primary Electrophysiologist: None   Past Medical History    Past Medical History:  Diagnosis Date   Abnormal Pap smear    Anemia 02/21/2012   Arthritis    Asthma    Bronchitis    Chronic hypertension with superimposed preeclampsia 06/01/2012   Diabetes mellitus (HCC) 02/21/2012   Fibroid    GBS (group B streptococcus) UTI complicating pregnancy 06/04/2012   Needs treatment in labor-resistant to Clinda and Erythromycin!     GERD (gastroesophageal reflux disease)    HTN (hypertension) 02/21/2012   Migraines 02/21/2012   Uterine fibroids affecting pregnancy, antepartum 07/27/2012    History of Present Illness  Carla Brooks is a 53 y.o. female with a PMH of HTN, DM type II, CKD stage III, HLD, Mnire's disease, syncope s/p loop recorder 03/2022, asthma, migraines who presents today for follow-up.  Ms. Garlington was seen in the ED for evaluation of chest pain in 2013 with normal ACS stress echo performed EF of 55-60% no RWMA.  She had no further workup at that time and was seen next in 10/2021 by Dr. Milburn at [facility] for syncopal episode and completed a 30-day event monitor along with 2D echo were normal and showed no evidence of arrhythmia.  She underwent Loop recorder placement on 03/2022 by Dr. Torrance with no recurrence of syncope since implant.  She was referred by PCP to Dr. Large for management of HTN along with abnormal EKG on 05/08/2023.  She underwent an ETT as well as echo for further evaluation.  Patient's ETT/Lexiscan  was normal and low risk and 2D echo showed normal LV function 55% with moderate concentric LVH and grade 1 DD with no valvular abnormalities noted.  Cardiac calcium  score was also ordered however was not completed at that time.  Patient's office BPs were well-controlled at that  time with no changes made to medication.  She was seen at follow-up on 07/02/2023 to review testing and patient reported home BP controlled in the 120s systolically.  Visit BP was elevated upon arrival but normalized by end of visit.  She was advised to monitor BP at that time.  During today's visit the patient reports*** .  Patient denies chest pain, palpitations, dyspnea, PND, orthopnea, nausea, vomiting, dizziness, syncope, edema, weight gain, or early satiety.  ***Notes: -Last ischemic evaluation: -Last echo: -Interim ED visits: Review of Systems  Please see the history of present illness.    All other systems reviewed and are otherwise negative except as noted above.  Physical Exam    Wt Readings from Last 3 Encounters:  07/02/23 207 lb (93.9 kg)  05/08/23 205 lb (93 kg)  03/18/23 205 lb 11 oz (93.3 kg)   CD:Uyzmz were no vitals filed for this visit.,There is no height or weight on file to calculate BMI. GEN: Well nourished, well developed in no acute distress Neck: No JVD; No carotid bruits Pulmonary: Clear to auscultation without rales, wheezing or rhonchi  Cardiovascular: Normal rate. Regular rhythm. Normal S1. Normal S2.   Murmurs: There is no murmur.  ABDOMEN: Soft, non-tender, non-distended EXTREMITIES:  No edema; No deformity   EKG/LABS/ Recent Cardiac Studies   ECG personally reviewed by me today - ***  Risk Assessment/Calculations:   {Does this patient have ATRIAL FIBRILLATION?[phone]}      Lab Results  Component Value Date  WBC 10.4 03/18/2023   HGB 10.3 (L) 03/18/2023   HCT 30.9 (L) 03/18/2023   MCV 86.3 03/18/2023   PLT 423 (H) 03/18/2023   Lab Results  Component Value Date   CREATININE 1.58 (H) 03/18/2023   BUN 21 (H) 03/18/2023   NA 138 03/18/2023   K 3.4 (L) 03/18/2023   CL 108 03/18/2023   CO2 20 (L) 03/18/2023   No results found for: CHOL, HDL, LDLCALC, LDLDIRECT, TRIG, CHOLHDL  Lab Results  Component Value Date   HGBA1C  5.9 (H) 03/17/2023   Assessment & Plan    1.  Essential hypertension:  2.  Hyperlipidemia:  3.  DM type II:  4.  History of syncope:      Disposition: Follow-up with Sunit Tolia, DO or APP in *** months {Are you ordering a CV Procedure (e.g. stress test, cath, DCCV, TEE, etc)?   Press F2        :[id number]}   Signed, Wyn Raddle, Jackee Shove, NP 09/03/2023, 9:36 AM [facility]

## 2023-09-04 ENCOUNTER — Ambulatory Visit: Payer: MEDICAID | Attending: Nurse Practitioner | Admitting: Nurse Practitioner

## 2023-09-04 DIAGNOSIS — E782 Mixed hyperlipidemia: Secondary | ICD-10-CM

## 2023-09-04 DIAGNOSIS — R55 Syncope and collapse: Secondary | ICD-10-CM

## 2023-09-04 DIAGNOSIS — E1165 Type 2 diabetes mellitus with hyperglycemia: Secondary | ICD-10-CM

## 2023-09-04 DIAGNOSIS — I10 Essential (primary) hypertension: Secondary | ICD-10-CM

## 2023-09-05 ENCOUNTER — Encounter: Payer: Self-pay | Admitting: Nurse Practitioner

## 2023-09-26 ENCOUNTER — Encounter (HOSPITAL_COMMUNITY): Payer: MEDICAID | Admitting: Physician Assistant

## 2023-10-01 ENCOUNTER — Other Ambulatory Visit (HOSPITAL_COMMUNITY): Payer: Self-pay

## 2023-10-03 ENCOUNTER — Encounter (HOSPITAL_COMMUNITY): Payer: MEDICAID | Admitting: Physician Assistant

## 2023-10-07 ENCOUNTER — Telehealth (HOSPITAL_COMMUNITY): Payer: Self-pay

## 2023-10-07 NOTE — Telephone Encounter (Signed)
/  PA WAS STARTED ON 10/01/23

## 2023-10-07 NOTE — Telephone Encounter (Signed)
PA was approved today 10/07/23

## 2023-10-10 ENCOUNTER — Encounter (HOSPITAL_COMMUNITY): Payer: Self-pay

## 2023-10-10 ENCOUNTER — Encounter (HOSPITAL_COMMUNITY): Payer: MEDICAID | Admitting: Physician Assistant

## 2023-12-09 ENCOUNTER — Telehealth: Payer: Self-pay

## 2023-12-09 DIAGNOSIS — E782 Mixed hyperlipidemia: Secondary | ICD-10-CM

## 2023-12-09 MED ORDER — ROSUVASTATIN CALCIUM 5 MG PO TABS
5.0000 mg | ORAL_TABLET | Freq: Every day | ORAL | 3 refills | Status: AC
Start: 2023-12-09 — End: ?

## 2023-12-09 NOTE — Telephone Encounter (Signed)
-----   Message from [facility] sent at 12/07/2023 12:54 PM EDT ----- External Labs: Collected: 11/07/2023 provided by primary team. Hemoglobin 11.9 g/dL Platelets 045,409 Hemoglobin A1c 6.8 Total cholesterol 92, triglyceride 121, HDL 38, LDL calculated 32. BUN 13, creatinine 1.11 AST and ALT within normal limits. Alkaline phosphatase 126 (upper limit of normal 121) Potassium 4.3  -Reduce Crestor to 5 mg p.o. daily. -Check fasting lipids and CMP in 6 weeks either with our practice or PCP.  Of note alkaline phosphatase is slightly above normal limits which need to follow-up please have her discuss with PCP.  Send a copy of these recommendations to PCP as well  Tessa Lerner, DO, FACC ----- Message ----- From: Royann Shivers Sent: 11/12/2023  12:36 PM EDT To: Tessa Lerner, DO; Cherylann Banas, RN

## 2023-12-09 NOTE — Telephone Encounter (Signed)
 Spoke with pt over the phone and explained Dr. Emelda Brothers recommendations. Pt verbalized understanding of plan and had no questions. New prescription for Rosuvastatin (Crestor) 5 mg has been sent in to the pt preferred pharmacy and labs have been ordered and released to be completed in 6 weeks. A copy of these recommendations has been sent to pt's PCP.

## 2023-12-15 ENCOUNTER — Ambulatory Visit (INDEPENDENT_AMBULATORY_CARE_PROVIDER_SITE_OTHER): Payer: MEDICAID | Admitting: Psychiatry

## 2023-12-15 ENCOUNTER — Other Ambulatory Visit: Payer: Self-pay

## 2023-12-15 ENCOUNTER — Encounter (HOSPITAL_COMMUNITY): Payer: Self-pay | Admitting: Psychiatry

## 2023-12-15 DIAGNOSIS — F4310 Post-traumatic stress disorder, unspecified: Secondary | ICD-10-CM | POA: Diagnosis not present

## 2023-12-15 DIAGNOSIS — F3132 Bipolar disorder, current episode depressed, moderate: Secondary | ICD-10-CM

## 2023-12-15 DIAGNOSIS — F411 Generalized anxiety disorder: Secondary | ICD-10-CM | POA: Diagnosis not present

## 2023-12-15 MED ORDER — SERTRALINE HCL 50 MG PO TABS
50.0000 mg | ORAL_TABLET | Freq: Every day | ORAL | 3 refills | Status: DC
  Filled 2023-12-15: qty 30, 30d supply, fill #0
  Filled 2024-01-16: qty 30, 30d supply, fill #1

## 2023-12-15 MED ORDER — CARIPRAZINE HCL 3 MG PO CAPS
3.0000 mg | ORAL_CAPSULE | Freq: Every day | ORAL | 3 refills | Status: DC
Start: 2023-12-15 — End: 2024-02-04
  Filled 2023-12-15: qty 30, 30d supply, fill #0

## 2023-12-15 MED ORDER — BUSPIRONE HCL 15 MG PO TABS
22.5000 mg | ORAL_TABLET | Freq: Two times a day (BID) | ORAL | 3 refills | Status: DC
  Filled 2023-12-15: qty 90, 30d supply, fill #0

## 2023-12-15 MED ORDER — HYDROXYZINE PAMOATE 25 MG PO CAPS
50.0000 mg | ORAL_CAPSULE | Freq: Two times a day (BID) | ORAL | 3 refills | Status: DC | PRN
  Filled 2023-12-15: qty 60, 8d supply, fill #0
  Filled 2024-01-16: qty 60, 8d supply, fill #1

## 2023-12-15 NOTE — Progress Notes (Signed)
 [facility] MD/PA/NP OP Progress Note  12/15/2023 10:15 AM Carla Brooks  MRN:  [id number]  Chief Complaint: "I ran out of my medications a month ago"  HPI:  53 year old female seen today for for follow-up evaluation.  She has a psychiatric history of generalized anxiety disorder, bipolar disorder, and PTSD.  Currently she is managed on Sertraline 50 mg daily, Lamotrigine 100 mg 2 times daily (prescribed by neurologist or PCP), Cariprazine 3 mg daily, Trazodone 50 mg at bedtime, Buspirone 22.5 mg 2 times daily, and Prazosin 2 mg at bedtime.  Patient told Clinical research associate that trazodone made her feel sick and reports that she discontinued it.  She also notes that she has been without prazosin as it was no longer prescribed by her psychiatric provider.  She notes that she does not feel that she needs to restart prazosin at this time.  Patient informed Clinical research associate that she has been out of her medication and currently feels that she is mentally declining.   Today she is well-groomed, pleasant, cooperative, and engaged with conversation.  She informed mother that she ran out of her medications a month ago.  Since running out of her medications she notes that anxiety and depression has exacerbated.  She notes that she worries about her 38 year old daughter, her daughter's schooling, finances, and her health.  Patient is currently unemployed and is appealing her disability.  Today provider conducted a GAD-7 and patient were 19.  Provide also conducted PHQ-9 patient's scores a 21.  Patient reports that when she is depressed she eats more.  She informed Clinical research associate that she recently gained 7 pounds due to overeating.  She also notes that her sleep is poor reporting that she sleeps 2 to 8 hours nightly.  Today she denies SI/HI/AVH.  She does note at times she has symptoms of hypomania such as racing thoughts, impulsive spending, increased irritability, fluctuations in mood, and impulsive spending (clothing, food, and "junk that I do not  need ")  Patient reports that she has constant back and knee pain.  She notes that she has degenerative disc disease and arthritis in her back.  Patient notes that gabapentin is somewhat effective in managing her pain.  She quantifies her pain today as a 3 out of 10.    Patient denies alcohol or illegal drug use.  Today patient agreeable to restarting Zoloft 50 mg and BuSpar 22.5 mg twice daily to help manage anxiety and depression.  She will also restart Vraylar 3 mg daily to help with his mood.  She will continue to get her Lamictal prescribed by her PCP/neurologist.  At this time she does not wish to restart trazodone or prazosin.  She will start hydroxyzine 50- 100 mg nightly as needed to help with his sleep. Potential side effects of medication and risks vs benefits of treatment vs non-treatment were explained and discussed. All questions were answered.  Patient reports that she cannot afford her medications at this time.  She informed my that she will pick them up in the month.  Provider suggested [facility] as she can charge her medications to her account and pay at a different time.  She was agreeable to this.  No other concerns at this time.  Visit Diagnosis:    ICD-10-CM   1. GAD (generalized anxiety disorder)  F41.1 hydrOXYzine (VISTARIL) 25 MG capsule    sertraline (ZOLOFT) 50 MG tablet    busPIRone (BUSPAR) 15 MG tablet    2. Bipolar 1  disorder, depressed, moderate (HCC)  F31.32 cariprazine (VRAYLAR) 3 MG capsule    sertraline (ZOLOFT) 50 MG tablet    3. PTSD (post-traumatic stress disorder)  F43.10 sertraline (ZOLOFT) 50 MG tablet      Past Psychiatric History: bipolar disorder, generalized anxiety disorder, depression, and PTSD   Past Medical History:  Past Medical History:  Diagnosis Date   Abnormal Pap smear    Anemia 02/21/2012   Arthritis    Asthma    Bronchitis    Chronic hypertension with superimposed preeclampsia 06/01/2012    Diabetes mellitus (HCC) 02/21/2012   Fibroid    GBS (group B streptococcus) UTI complicating pregnancy 06/04/2012   Needs treatment in labor-resistant to Clinda and Erythromycin!     GERD (gastroesophageal reflux disease)    HTN (hypertension) 02/21/2012   Migraines 02/21/2012   Uterine fibroids affecting pregnancy, antepartum 07/27/2012    Past Surgical History:  Procedure Laterality Date   CESAREAN SECTION N/A 10/29/2012   Procedure: CESAREAN SECTION;  Surgeon: Wendelyn Halter, MD;  Location: [facility];  Service: Obstetrics;  Laterality: N/A;   CHOLECYSTECTOMY  09/27/2001    Family Psychiatric History: Uncle - schizophrenia  Family History:  Family History  Problem Relation Age of Onset   Heart disease Father    Coronary artery disease Father    Cancer Paternal Uncle        throat   Diabetes Maternal Grandmother    Cancer Maternal Grandfather        prostate   Cancer Paternal Grandfather        lung    Social History:  Social History   Socioeconomic History   Marital status: Single    Spouse name: Not on file   Number of children: 3   Years of education: Not on file   Highest education level: Not on file  Occupational History   Not on file  Tobacco Use   Smoking status: Never   Smokeless tobacco: Never  Vaping Use   Vaping status: Never Used  Substance and Sexual Activity   Alcohol use: Not Currently    Comment: occasional   Drug use: No   Sexual activity: Not Currently    Birth control/protection: None  Other Topics Concern   Not on file  Social History Narrative   Not on file   Social Drivers of Health   Financial Resource Strain: Medium Risk (11/07/2023)   Received from [facility]   Overall Financial Resource Strain (CARDIA)    Difficulty of Paying Living Expenses: Somewhat hard  Food Insecurity: No Food Insecurity (11/07/2023)   Received from [facility]   Hunger Vital Sign    Worried About Running Out of Food in the Last Year: Never true    Ran  Out of Food in the Last Year: Never true  Transportation Needs: No Transportation Needs (11/07/2023)   Received [facility] - Transportation    Lack of Transportation (Medical): No    Lack of Transportation (Non-Medical): No  Physical Activity: Inactive (05/03/2023)   Received from [facility]   Exercise Vital Sign    Days of Exercise per Week: 7 days    Minutes of Exercise per Session: 0 min  Stress: Stress Concern Present (05/03/2023)   Received from [facility] - Occupational Stress Questionnaire    Feeling of Stress : Very much  Social Connections: Socially Integrated (05/03/2023)   Received from [facility]   Social Network  How would you rate your social network (family, work, friends)?: Good participation with social networks    Allergies:  Allergies  Allergen Reactions   Shrimp [Shellfish Allergy] Anaphylaxis and Hives   Hydrocodone Hives   Penicillins Hives and Other (See Comments)    Has patient had a PCN reaction causing immediate rash, facial/tongue/throat swelling, SOB or lightheadedness with hypotension: No Has patient had a PCN reaction causing severe rash involving mucus membranes or skin necrosis: No Has patient had a PCN reaction that required hospitalization No Has patient had a PCN reaction occurring within the last 10 years: No If all of the above answers are "NO", then may proceed with Cephalosporin use.   Pork-Derived Products Other (See Comments)    Reaction:  Increases pts BP    Metabolic Disorder Labs: Lab Results  Component Value Date   HGBA1C 5.9 (H) 03/17/2023   MPG 122.63 03/17/2023   MPG 120 (H) 06/01/2012   No results found for: "PROLACTIN" No results found for: "CHOL", "TRIG", "HDL", "CHOLHDL", "VLDL", "LDLCALC" Lab Results  Component Value Date   TSH 0.745 03/17/2023   TSH 0.667 06/01/2012    Therapeutic Level Labs: No results found for: "LITHIUM" No results found for:  "VALPROATE" No results found for: "CBMZ"  Current Medications: Current Outpatient Medications  Medication Sig Dispense Refill   albuterol (VENTOLIN HFA) 108 (90 Base) MCG/ACT inhaler Inhale 2 puffs into the lungs every 6 (six) hours as needed for shortness of breath.     amLODipine (NORVASC) 5 MG tablet Take 5 mg by mouth daily.     aspirin EC 81 MG tablet Take 1 tablet (81 mg total) by mouth daily. Swallow whole. 30 tablet 12   busPIRone (BUSPAR) 15 MG tablet Take 1.5 tablets (22.5 mg total) by mouth 2 (two) times daily. 90 tablet 3   cariprazine (VRAYLAR) 3 MG capsule Take 1 capsule (3 mg total) by mouth daily. 30 capsule 3   clonazePAM (KLONOPIN) 0.5 MG tablet Take 2 tablets (1 mg total) by mouth 2 (two) times daily as needed for anxiety. 60 tablet 0   EPINEPHrine (EPIPEN) 0.3 mg/0.3 mL DEVI Inject 0.3 mLs (0.3 mg total) into the muscle as needed. Use as directed in the event of a severe allergic reaction 1 Device 3   gabapentin (NEURONTIN) 300 MG capsule Take 300 mg by mouth 3 (three) times daily.     hydrOXYzine (VISTARIL) 25 MG capsule Take 2-4 capsules (50-100 mg total) by mouth 2 (two) times daily as needed for anxiety. 60 capsule 3   JARDIANCE 10 MG TABS tablet Take 10 mg by mouth daily.     labetalol (NORMODYNE) 300 MG tablet Take 300 mg by mouth 2 (two) times daily.     lamoTRIgine (LAMICTAL) 100 MG tablet Take 1 tablet (100 mg total) by mouth 2 (two) times daily. 60 tablet 1   losartan (COZAAR) 25 MG tablet Take 12.5 mg by mouth 2 (two) times daily.     meclizine (ANTIVERT) 25 MG tablet Take 1 tablet by mouth as needed.     metFORMIN (GLUCOPHAGE) 500 MG tablet Take 1 tablet (500 mg total) by mouth 2 (two) times daily with a meal. (Patient taking differently: Take 0.5 tablets by mouth 2 (two) times daily with a meal.) 60 tablet 0   omeprazole (PRILOSEC) 20 MG capsule Take 20 mg by mouth 2 (two) times daily.     prochlorperazine (COMPAZINE) 5 MG tablet Take 1 tablet (5 mg total) by  mouth every 6 (  six) hours as needed for nausea or vomiting. 15 tablet 0   rosuvastatin (CRESTOR) 5 MG tablet Take 1 tablet (5 mg total) by mouth daily. 30 tablet 3   sertraline (ZOLOFT) 50 MG tablet Take 1 tablet (50 mg total) by mouth daily. 30 tablet 3   tiZANidine (ZANAFLEX) 4 MG tablet Take 4 mg by mouth every 6 (six) hours as needed for muscle spasms.     No current facility-administered medications for this visit.     Musculoskeletal: Strength & Muscle Tone: within normal limits Gait & Station: normal Patient leans: N/A  Psychiatric Specialty Exam: Review of Systems  Last menstrual period 04/02/2017, unknown if currently breastfeeding.There is no height or weight on file to calculate BMI.  General Appearance: Well Groomed  Eye Contact:  Good  Speech:  Clear and Coherent and Normal Rate  Volume:  Normal  Mood:  Anxious and Depressed  Affect:  Appropriate and Congruent  Thought Process:  Coherent, Goal Directed, and Linear  Orientation:  Full (Time, Place, and Person)  Thought Content: WDL and Logical   Suicidal Thoughts:  No  Homicidal Thoughts:  No  Memory:  Immediate;   Good Recent;   Good Remote;   Good  Judgement:  Good  Insight:  Good  Psychomotor Activity:  Normal  Concentration:  Concentration: Good and Attention Span: Good  Recall:  Good  Fund of Knowledge: Good  Language: Good  Akathisia:  No  Handed:  Right  AIMS (if indicated): not done  Assets:  Communication Skills Desire for Improvement Housing Leisure Time Social Support Transportation  ADL's:  Intact  Cognition: WNL  Sleep:  Poor   Screenings: GAD-7    Flowsheet Row Office Visit from 12/15/2023 in [facility] Clinical Support from 07/10/2023 in [facility] Clinical Support from 02/06/2023 in [facility] Office Visit from 12/26/2022 in [facility] Counselor from 12/11/2022 in [facility]  Total GAD-7 Score 19 16 19 19 18       PHQ2-9    Flowsheet Row Office Visit from 12/15/2023 in [facility] Clinical Support from 07/10/2023 in [facility] Clinical Support from 02/06/2023 in [facility] Office Visit from 12/26/2022 in [facility] Counselor from 12/11/2022 in [facility]  PHQ-2 Total Score 6 6 4 6 3   PHQ-9 Total Score 21 15 17 23 16       Flowsheet Row Clinical Support from 07/10/2023 in [facility] ED to Hosp-Admission (Discharged) from 03/17/2023 in [facility] ED from 02/07/2023 in [facility] Emergency Department at [facility]  C-SSRS RISK CATEGORY No Risk No Risk No Risk        Assessment and Plan: Patient has been without medications for a month that she is mentally declining as she is experiencing increased anxiety, depression, hypomania.  Today patient agreeable to restarting Zoloft 50 mg and BuSpar 22.5 mg twice daily to help manage anxiety and depression.  She will also restart Vraylar 3 mg daily to help with his mood.  She will continue to get her Lamictal prescribed by her PCP/neurologist.  At this time she does not wish to restart trazodone or prazosin.  She will start hydroxyzine 50- 100 mg nightly as needed to help with his sleep. Potential side effects of medication and risks vs benefits of treatment vs  non-treatment were explained and discussed. All questions were answered.  Patient reports that she cannot afford her medications at this time.  She informed my that she will pick them up in the month.  Provider suggested [facility] went to [facility] as she can charge her medications to her account and pay at a different time.  She was agreeable to this.  1. GAD (generalized anxiety disorder)  Start- hydrOXYzine (VISTARIL) 25  MG capsule; Take 2-4 capsules (50-100 mg total) by mouth 2 (two) times daily as needed for anxiety.  Dispense: 60 capsule; Refill: 3 Restart- sertraline (ZOLOFT) 50 MG tablet; Take 1 tablet (50 mg total) by mouth daily.  Dispense: 30 tablet; Refill: 3 Restart- busPIRone (BUSPAR) 15 MG tablet; Take 1.5 tablets (22.5 mg total) by mouth 2 (two) times daily.  Dispense: 90 tablet; Refill: 3  2. Bipolar 1 disorder, depressed, moderate (HCC)  Restart- cariprazine (VRAYLAR) 3 MG capsule; Take 1 capsule (3 mg total) by mouth daily.  Dispense: 30 capsule; Refill: 3 Restart- sertraline (ZOLOFT) 50 MG tablet; Take 1 tablet (50 mg total) by mouth daily.  Dispense: 30 tablet; Refill: 3  3. PTSD (post-traumatic stress disorder)  Restart- sertraline (ZOLOFT) 50 MG tablet; Take 1 tablet (50 mg total) by mouth daily.  Dispense: 30 tablet; Refill: 3   Collaboration of Care: Collaboration of Care: Other provider involved in patient's care AEB primary psychiatric provider  Patient/Guardian was advised Release of Information must be obtained prior to any record release in order to collaborate their care with an outside provider. Patient/Guardian was advised if they have not already done so to contact the registration department to sign all necessary forms in order for us  to release information regarding their care.   Consent: Patient/Guardian gives verbal consent for treatment and assignment of benefits for services provided during this visit. Patient/Guardian expressed understanding and agreed to proceed.   Follow-up in 2 months Arlyne Bering, NP 12/15/2023, 10:15 AM

## 2023-12-25 ENCOUNTER — Other Ambulatory Visit: Payer: Self-pay

## 2024-01-19 ENCOUNTER — Other Ambulatory Visit: Payer: Self-pay

## 2024-02-03 ENCOUNTER — Ambulatory Visit (INDEPENDENT_AMBULATORY_CARE_PROVIDER_SITE_OTHER): Payer: MEDICAID | Admitting: Physician Assistant

## 2024-02-03 ENCOUNTER — Telehealth (HOSPITAL_COMMUNITY): Payer: Self-pay | Admitting: *Deleted

## 2024-02-03 ENCOUNTER — Encounter (HOSPITAL_COMMUNITY): Payer: Self-pay | Admitting: Physician Assistant

## 2024-02-03 VITALS — BP 148/82 | HR 92 | Temp 98.8°F | Ht 68.0 in | Wt 217.6 lb

## 2024-02-03 DIAGNOSIS — F411 Generalized anxiety disorder: Secondary | ICD-10-CM

## 2024-02-03 DIAGNOSIS — F3132 Bipolar disorder, current episode depressed, moderate: Secondary | ICD-10-CM

## 2024-02-03 DIAGNOSIS — G2401 Drug induced subacute dyskinesia: Secondary | ICD-10-CM

## 2024-02-03 DIAGNOSIS — F4310 Post-traumatic stress disorder, unspecified: Secondary | ICD-10-CM | POA: Diagnosis not present

## 2024-02-03 MED ORDER — LURASIDONE HCL 20 MG PO TABS: ORAL_TABLET | ORAL | 0 refills | Status: DC

## 2024-02-03 MED ORDER — VALBENAZINE TOSYLATE 40 MG PO CAPS: 40.0000 mg | ORAL_CAPSULE | Freq: Every day | ORAL | 1 refills | Status: AC

## 2024-02-03 MED ORDER — BUSPIRONE HCL 15 MG PO TABS: 22.5000 mg | ORAL_TABLET | Freq: Two times a day (BID) | ORAL | 3 refills | Status: DC

## 2024-02-03 MED ORDER — HYDROXYZINE PAMOATE 25 MG PO CAPS: 50.0000 mg | ORAL_CAPSULE | Freq: Two times a day (BID) | ORAL | 3 refills | Status: DC | PRN

## 2024-02-03 MED ORDER — SERTRALINE HCL 50 MG PO TABS: 50.0000 mg | ORAL_TABLET | Freq: Every day | ORAL | 3 refills | Status: DC

## 2024-02-03 MED ORDER — LURASIDONE HCL 40 MG PO TABS: 40.0000 mg | ORAL_TABLET | Freq: Every day | ORAL | 1 refills | Status: DC

## 2024-02-03 MED ORDER — LAMOTRIGINE 100 MG PO TABS: 100.0000 mg | ORAL_TABLET | Freq: Two times a day (BID) | ORAL | 1 refills | Status: DC

## 2024-02-03 NOTE — Telephone Encounter (Signed)
 Fax received for PA of [employer]. Submitted online with cover my meds. Awaiting response.

## 2024-02-03 NOTE — Progress Notes (Signed)
 [facility] MD/PA/NP OP Progress Note  02/03/2024 8:59 PM Carla Brooks  MRN:  [id number]  Chief Complaint:  Chief Complaint  Patient presents with   Follow-up   Medication Management   HPI:   Carla Brooks is a 53 year old, African-American female with a past psychiatric history significant for generalized anxiety disorder, bipolar disorder (depressed, moderate), and PTSD who presents to [facility] for follow up and medication management.  Patient was last seen by Arlyne Bering, NP on 12/15/2023.  During her last encounter, patient was being managed on the following psychiatric medications:  Hydroxyzine  25 mg 2 to 4 capsules by mouth (50 to 100 mg total) 2 times daily Sertraline  50 mg daily Lamotrigine  100 mg 2 times daily Cariprazine  3 mg daily Buspirone  22.5 mg 2 times daily  Since her last encounter, patient reports that she has been experiencing depression and anxiety even when taking her medications regularly.  Patient rates her depression at 9 out of 10 with 10 being most severe.  Patient reports that she has been experiencing worsening depression for 2 to 3 months.  Patient denies any discernible triggers to her depression.  Patient endorses depressive episodes 3 days/week.  Patient endorses the following depressive symptoms: feelings of sadness, lack of motivation, decreased energy, crying spells (occasional), irritability, decreased concentration, feelings of guilt/worthlessness, and hopelessness.  Patient rates her anxiety at 9 out of 10.  Patient endorses elevated anxiety when having to leave her home.  She denies any new stressors at this time.  A PHQ-9 screen was performed with the patient scoring a 23.  A GAD-7 screen was also performed with the patient scoring a 20.  Patient is alert and oriented x 4, calm, cooperative, and fully engaged in conversation during the encounter.  Patient describes her mood as irritable, depressed, and  anxious.  Patient exhibits depressed mood with congruent affect.  Patient denies suicidal or homicidal ideations.  She further denies auditory or visual hallucinations and does not appear to be responding to internal/external stimuli.  Patient endorses good sleep and receives on average 7 hours of sleep per night.  Though patient receives good sleep, she reports that she still feels exhausted during the day and may occasionally take a nap.  Patient endorses good appetite needs on average 3 meals per day.  Patient denies alcohol consumption, tobacco use, or illicit drug use.  Visit Diagnosis:    ICD-10-CM   1. Tardive dyskinesia  G24.01 valbenazine  (INGREZZA ) 40 MG capsule    2. Bipolar 1 disorder, depressed, moderate (HCC)  F31.32 lamoTRIgine  (LAMICTAL ) 100 MG tablet    sertraline  (ZOLOFT ) 50 MG tablet    lurasidone  (LATUDA ) 20 MG TABS tablet    lurasidone  (LATUDA ) 40 MG TABS tablet    3. GAD (generalized anxiety disorder)  F41.1 busPIRone  (BUSPAR ) 15 MG tablet    sertraline  (ZOLOFT ) 50 MG tablet    hydrOXYzine  (VISTARIL ) 25 MG capsule    4. PTSD (post-traumatic stress disorder)  F43.10 sertraline  (ZOLOFT ) 50 MG tablet     Past Psychiatric History:  Patient endorses a past psychiatric history significant for bipolar disorder (bipolar 1 versus bipolar 2), generalized anxiety disorder, depression, and PTSD   Patient denies a past history of hospitalization due to mental health   Patient denies a past history of suicide attempt   Patient denies a past history of homicide attempt  Past Medical History:  Past Medical History:  Diagnosis Date   Abnormal Pap smear    Anemia  02/21/2012   Arthritis    Asthma    Bronchitis    Chronic hypertension with superimposed preeclampsia 06/01/2012   Diabetes mellitus (HCC) 02/21/2012   Fibroid    GBS (group B streptococcus) UTI complicating pregnancy 06/04/2012   Needs treatment in labor-resistant to Clinda and Erythromycin!     GERD  (gastroesophageal reflux disease)    HTN (hypertension) 02/21/2012   Migraines 02/21/2012   Uterine fibroids affecting pregnancy, antepartum 07/27/2012    Past Surgical History:  Procedure Laterality Date   CESAREAN SECTION N/A 10/29/2012   Procedure: CESAREAN SECTION;  Surgeon: Wendelyn Halter, MD;  Location: [facility];  Service: Obstetrics;  Laterality: N/A;   CHOLECYSTECTOMY  09/27/2001    Family Psychiatric History:  Uncle - schizophrenia, patient reports that he was taking lithium   Family history of suicide attempts: Patient denies Family history of homicide attempts: Patient denies Family history of substance abuse: Patient reports that her son uses marijuana  Family History:  Family History  Problem Relation Age of Onset   Heart disease Father    Coronary artery disease Father    Cancer Paternal Uncle        throat   Diabetes Maternal Grandmother    Cancer Maternal Grandfather        prostate   Cancer Paternal Grandfather        lung    Social History:  Social History   Socioeconomic History   Marital status: Single    Spouse name: Not on file   Number of children: 3   Years of education: Not on file   Highest education level: Not on file  Occupational History   Not on file  Tobacco Use   Smoking status: Never   Smokeless tobacco: Never  Vaping Use   Vaping status: Never Used  Substance and Sexual Activity   Alcohol use: Not Currently    Comment: occasional   Drug use: No   Sexual activity: Not Currently    Birth control/protection: None  Other Topics Concern   Not on file  Social History Narrative   Not on file   Social Drivers of Health   Financial Resource Strain: Medium Risk (11/07/2023)   Received from [facility]   Overall Financial Resource Strain (CARDIA)    Difficulty of Paying Living Expenses: Somewhat hard  Food Insecurity: No Food Insecurity (11/07/2023)   Received from [facility]   Hunger Vital Sign    Worried About Running Out of  Food in the Last Year: Never true    Ran Out of Food in the Last Year: Never true  Transportation Needs: No Transportation Needs (11/07/2023)   Received from [facility] - Transportation    Lack of Transportation (Medical): No    Lack of Transportation (Non-Medical): No  Physical Activity: Inactive (05/03/2023)   Received from [facility]   Exercise Vital Sign    Days of Exercise per Week: 7 days    Minutes of Exercise per Session: 0 min  Stress: Stress Concern Present (05/03/2023)   Received from [facility] - Occupational Stress Questionnaire    Feeling of Stress : Very much  Social Connections: Socially Integrated (05/03/2023)   Received from [facility]   Social Network    How would you rate your social network (family, work, friends)?: Good participation with social networks    Allergies:  Allergies  Allergen Reactions   Shrimp [Shellfish Allergy] Anaphylaxis and Hives  Hydrocodone  Hives   Penicillins Hives and Other (See Comments)    Has patient had a PCN reaction causing immediate rash, facial/tongue/throat swelling, SOB or lightheadedness with hypotension: No Has patient had a PCN reaction causing severe rash involving mucus membranes or skin necrosis: No Has patient had a PCN reaction that required hospitalization No Has patient had a PCN reaction occurring within the last 10 years: No If all of the above answers are NO, then may proceed with Cephalosporin use.   Pork-Derived Products Other (See Comments)    Reaction:  Increases pts BP    Metabolic Disorder Labs: Lab Results  Component Value Date   HGBA1C 5.9 (H) 03/17/2023   MPG 122.63 03/17/2023   MPG 120 (H) 06/01/2012   No results found for: PROLACTIN No results found for: CHOL, TRIG, HDL, CHOLHDL, VLDL, LDLCALC Lab Results  Component Value Date   TSH 0.745 03/17/2023   TSH 0.667 06/01/2012    Therapeutic Level Labs: No results  found for: LITHIUM No results found for: VALPROATE No results found for: CBMZ  Current Medications: Current Outpatient Medications  Medication Sig Dispense Refill   lurasidone  (LATUDA ) 20 MG TABS tablet Take 1 tablet (20 mg total) by mouth daily with breakfast for 6 days, THEN 2 tablets (40 mg total) daily with breakfast for 24 days. 54 tablet 0   [START ON 03/04/2024] lurasidone  (LATUDA ) 40 MG TABS tablet Take 1 tablet (40 mg total) by mouth daily with breakfast. 30 tablet 1   valbenazine  (INGREZZA ) 40 MG capsule Take 1 capsule (40 mg total) by mouth daily. 30 capsule 1   albuterol  (VENTOLIN  HFA) 108 (90 Base) MCG/ACT inhaler Inhale 2 puffs into the lungs every 6 (six) hours as needed for shortness of breath.     amLODipine  (NORVASC ) 5 MG tablet Take 5 mg by mouth daily.     aspirin  EC 81 MG tablet Take 1 tablet (81 mg total) by mouth daily. Swallow whole. 30 tablet 12   busPIRone  (BUSPAR ) 15 MG tablet Take 1.5 tablets (22.5 mg total) by mouth 2 (two) times daily. 90 tablet 3   cariprazine  (VRAYLAR ) 3 MG capsule Take 1 capsule (3 mg total) by mouth daily. 30 capsule 3   clonazePAM  (KLONOPIN ) 0.5 MG tablet Take 2 tablets (1 mg total) by mouth 2 (two) times daily as needed for anxiety. 60 tablet 0   EPINEPHrine  (EPIPEN ) 0.3 mg/0.3 mL DEVI Inject 0.3 mLs (0.3 mg total) into the muscle as needed. Use as directed in the event of a severe allergic reaction 1 Device 3   gabapentin  (NEURONTIN ) 300 MG capsule Take 300 mg by mouth 3 (three) times daily.     hydrOXYzine  (VISTARIL ) 25 MG capsule Take 2-4 capsules (50-100 mg total) by mouth 2 (two) times daily as needed for anxiety. 60 capsule 3   JARDIANCE 10 MG TABS tablet Take 10 mg by mouth daily.     labetalol  (NORMODYNE ) 300 MG tablet Take 300 mg by mouth 2 (two) times daily.     lamoTRIgine  (LAMICTAL ) 100 MG tablet Take 1 tablet (100 mg total) by mouth 2 (two) times daily. 60 tablet 1   losartan (COZAAR) 25 MG tablet Take 12.5 mg by mouth 2  (two) times daily.     meclizine (ANTIVERT) 25 MG tablet Take 1 tablet by mouth as needed.     metFORMIN  (GLUCOPHAGE ) 500 MG tablet Take 1 tablet (500 mg total) by mouth 2 (two) times daily with a meal. (Patient taking differently: Take 0.5 tablets  by mouth 2 (two) times daily with a meal.) 60 tablet 0   omeprazole  (PRILOSEC) 20 MG capsule Take 20 mg by mouth 2 (two) times daily.     prochlorperazine  (COMPAZINE ) 5 MG tablet Take 1 tablet (5 mg total) by mouth every 6 (six) hours as needed for nausea or vomiting. 15 tablet 0   rosuvastatin  (CRESTOR ) 5 MG tablet Take 1 tablet (5 mg total) by mouth daily. 30 tablet 3   sertraline  (ZOLOFT ) 50 MG tablet Take 1 tablet (50 mg total) by mouth daily. 30 tablet 3   tiZANidine (ZANAFLEX) 4 MG tablet Take 4 mg by mouth every 6 (six) hours as needed for muscle spasms.     No current facility-administered medications for this visit.     Musculoskeletal: Strength & Muscle Tone: within normal limits Gait & Station: normal Patient leans: N/A  Psychiatric Specialty Exam: Review of Systems  Psychiatric/Behavioral:  Positive for dysphoric mood. Negative for decreased concentration, hallucinations, self-injury, sleep disturbance and suicidal ideas. The patient is nervous/anxious. The patient is not hyperactive.     Blood pressure (!) 148/82, pulse 92, temperature 98.8 F (37.1 C), temperature source Oral, height 5' 8 (1.727 m), weight 217 lb 9.6 oz (98.7 kg), last menstrual period 04/02/2017, SpO2 99%, unknown if currently breastfeeding.Body mass index is 33.09 kg/m.  General Appearance: Casual  Eye Contact:  Good  Speech:  Clear and Coherent and Normal Rate  Volume:  Normal  Mood:  Anxious and Depressed  Affect:  Congruent  Thought Process:  Coherent, Goal Directed, and Descriptions of Associations: Intact  Orientation:  Full (Time, Place, and Person)  Thought Content: WDL   Suicidal Thoughts:  No  Homicidal Thoughts:  No  Memory:  Immediate;    Good Recent;   Good Remote;   Fair  Judgement:  Good  Insight:  Good  Psychomotor Activity:  Normal  Concentration:  Concentration: Good and Attention Span: Good  Recall:  Good  Fund of Knowledge: Good  Language: Good  Akathisia:  No  Handed:  Right  AIMS (if indicated): not done  Assets:  Communication Skills Desire for Improvement Social Support  ADL's:  Intact  Cognition: WNL  Sleep:  Good   Screenings: AIMS    Flowsheet Row Clinical Support from 02/03/2024 in [facility]  AIMS Total Score 6      GAD-7    Flowsheet Row Clinical Support from 02/03/2024 in [facility] Office Visit from 12/15/2023 in [facility] Clinical Support from 07/10/2023 in [facility] Clinical Support from 02/06/2023 in [facility] Office Visit from 12/26/2022 in [facility]  Total GAD-7 Score 20 19 16 19 19       PHQ2-9    Flowsheet Row Clinical Support from 02/03/2024 in [facility] Office Visit from 12/15/2023 in [facility] Clinical Support from 07/10/2023 in [facility] Clinical Support from 02/06/2023 in [facility] Office Visit from 12/26/2022 in [facility]  PHQ-2 Total Score 6 6 6 4 6   PHQ-9 Total Score 23 21 15 17 23       Flowsheet Row Clinical Support from 02/03/2024 in [facility] Clinical Support from 07/10/2023 in [facility] ED to Hosp-Admission (Discharged) from 03/17/2023 in [facility]  C-SSRS RISK CATEGORY No Risk No Risk No  Risk        Assessment and Plan:   Dallas Scorsone is a 53 year old, African-American female with a past psychiatric history significant for generalized anxiety disorder, bipolar  disorder (depressed, moderate), and PTSD who presents to [facility] for follow up and medication management.  Patient presents to the encounter stating that she continues to take her medications regularly.  Patient denies experiencing any adverse side effects from her current medication regimen.  An aims assessment was performed with the patient scoring a 6.  Provider recommended patient be placed on Ingrezza  40 mg daily for the management of her involuntary movements (tardive dyskinesia).  Patient was agreeable to recommendation.  Patient continues to endorse worsening depression and anxiety even when taking her medications regularly.  She reports that she has been experiencing worsening depression for the past 2 to 3 months and may experience depressive episodes 3 days out of the week.  Patient reports that her anxiety has gotten to the point where she is unable to leave her home.  A PHQ-9 screen was performed with the patient scoring a 23.  A GAD-7 screen was also performed with the patient scoring a 20.  Provider recommended discontinuing her use of Vraylar  since Vraylar  3 mg is considered the therapeutic dose for managing bipolar depression.  Provider recommended placing patient on Latuda  20 mg for 6 days, followed by 40 mg daily for the management of her bipolar depression and for mood stability.  Patient was agreeable to recommendation.  Patient to continue taking all other medications as prescribed.  Patient's medications to be e-prescribed to pharmacy of choice.  The following labs are pending for the patient: Lipid panel and comprehensive metabolic panel.  Patient denies suicidal ideations and is able to contract for safety following the conclusion of the encounter.  Collaboration of Care: Collaboration of Care: Medication Management AEB patient being followed by mental health provider at this facility, Psychiatrist AEB provider managing patient's  psychiatric medications, and Referral or follow-up with counselor/therapist AEB patient being seen by a licensed clinical social worker at this facility  Patient/Guardian was advised Release of Information must be obtained prior to any record release in order to collaborate their care with an outside provider. Patient/Guardian was advised if they have not already done so to contact the registration department to sign all necessary forms in order for us  to release information regarding their care.   Consent: Patient/Guardian gives verbal consent for treatment and assignment of benefits for services provided during this visit. Patient/Guardian expressed understanding and agreed to proceed.   1. Bipolar 1 disorder, depressed, moderate (HCC)  - lamoTRIgine  (LAMICTAL ) 100 MG tablet; Take 1 tablet (100 mg total) by mouth 2 (two) times daily.  Dispense: 60 tablet; Refill: 1 - sertraline  (ZOLOFT ) 50 MG tablet; Take 1 tablet (50 mg total) by mouth daily.  Dispense: 30 tablet; Refill: 3 - lurasidone  (LATUDA ) 20 MG TABS tablet; Take 1 tablet (20 mg total) by mouth daily with breakfast for 6 days, THEN 2 tablets (40 mg total) daily with breakfast for 24 days.  Dispense: 54 tablet; Refill: 0 - lurasidone  (LATUDA ) 40 MG TABS tablet; Take 1 tablet (40 mg total) by mouth daily with breakfast.  Dispense: 30 tablet; Refill: 1  2. GAD (generalized anxiety disorder)  - busPIRone  (BUSPAR ) 15 MG tablet; Take 1.5 tablets (22.5 mg total) by mouth 2 (two) times daily.  Dispense: 90 tablet; Refill: 3 - sertraline  (ZOLOFT ) 50 MG tablet; Take 1 tablet (50  mg total) by mouth daily.  Dispense: 30 tablet; Refill: 3 - hydrOXYzine  (VISTARIL ) 25 MG capsule; Take 2-4 capsules (50-100 mg total) by mouth 2 (two) times daily as needed for anxiety.  Dispense: 60 capsule; Refill: 3  3. PTSD (post-traumatic stress disorder)  - sertraline  (ZOLOFT ) 50 MG tablet; Take 1 tablet (50 mg total) by mouth daily.  Dispense: 30 tablet; Refill:  3  4. Tardive dyskinesia (Primary)  - valbenazine  (INGREZZA ) 40 MG capsule; Take 1 capsule (40 mg total) by mouth daily.  Dispense: 30 capsule; Refill: 1  Patient to follow-up in 7 weeks Provider spent a total of 21 minutes with the patient/reviewing patient's chart  Gates Kasal, PA 02/03/2024, 8:59 PM

## 2024-02-04 ENCOUNTER — Telehealth (HOSPITAL_COMMUNITY): Payer: Self-pay | Admitting: *Deleted

## 2024-02-04 MED ORDER — LURASIDONE HCL 20 MG PO TABS: ORAL_TABLET | ORAL | 0 refills | Status: DC

## 2024-02-04 MED ORDER — LURASIDONE HCL 40 MG PO TABS: 40.0000 mg | ORAL_TABLET | Freq: Every day | ORAL | 1 refills | Status: DC

## 2024-02-04 NOTE — Telephone Encounter (Signed)
 Message acknowledged and reviewed. Prescriptions were resubmitted.

## 2024-02-04 NOTE — Telephone Encounter (Signed)
 Fax received for [place] denial of Lurasidone 20mg  stating that a quantity limit of 54 for 30 days is more than the FDA allows. Prescription can be written for 20mg  x6 days and then a new prescription for 40mg .

## 2024-02-04 NOTE — Telephone Encounter (Signed)
 Message acknowledged and reviewed.

## 2024-02-13 ENCOUNTER — Emergency Department (HOSPITAL_COMMUNITY)
Admission: EM | Admit: 2024-02-13 | Discharge: 2024-02-13 | Disposition: A | Discharge status: Home / Self Care | Payer: MEDICAID | Attending: Emergency Medicine | Admitting: Emergency Medicine

## 2024-02-13 ENCOUNTER — Other Ambulatory Visit: Payer: Self-pay

## 2024-02-13 ENCOUNTER — Encounter (HOSPITAL_COMMUNITY): Payer: Self-pay | Admitting: Emergency Medicine

## 2024-02-13 DIAGNOSIS — Z7984 Long term (current) use of oral hypoglycemic drugs: Secondary | ICD-10-CM | POA: Diagnosis not present

## 2024-02-13 DIAGNOSIS — E1165 Type 2 diabetes mellitus with hyperglycemia: Secondary | ICD-10-CM | POA: Insufficient documentation

## 2024-02-13 DIAGNOSIS — Z7982 Long term (current) use of aspirin: Secondary | ICD-10-CM | POA: Diagnosis not present

## 2024-02-13 DIAGNOSIS — E871 Hypo-osmolality and hyponatremia: Secondary | ICD-10-CM | POA: Diagnosis not present

## 2024-02-13 DIAGNOSIS — R739 Hyperglycemia, unspecified: Secondary | ICD-10-CM | POA: Diagnosis present

## 2024-02-13 LAB — COMPREHENSIVE METABOLIC PANEL WITH GFR
ALT: 18 U/L (ref 0–44)
AST: 16 U/L (ref 15–41)
Albumin: 3.8 g/dL (ref 3.5–5.0)
Alkaline Phosphatase: 109 U/L (ref 38–126)
Anion gap: 12 (ref 5–15)
BUN: 16 mg/dL (ref 6–20)
CO2: 21 mmol/L — ABNORMAL LOW (ref 22–32)
Calcium: 9.5 mg/dL (ref 8.9–10.3)
Chloride: 99 mmol/L (ref 98–111)
Creatinine, Ser: 1.39 mg/dL — ABNORMAL HIGH (ref 0.44–1.00)
GFR, Estimated: 46 mL/min — ABNORMAL LOW
Glucose, Bld: 617 mg/dL (ref 70–99)
Potassium: 5 mmol/L (ref 3.5–5.1)
Sodium: 132 mmol/L — ABNORMAL LOW (ref 135–145)
Total Bilirubin: 0.5 mg/dL (ref 0.0–1.2)
Total Protein: 7.2 g/dL (ref 6.5–8.1)

## 2024-02-13 LAB — CBG MONITORING, ED
Glucose-Capillary: >600 mg/dL (ref 70–99)
Glucose-Capillary: 341 mg/dL — ABNORMAL HIGH (ref 70–99)

## 2024-02-13 LAB — I-STAT CHEM 8, ED
BUN: 18 mg/dL (ref 6–20)
Calcium, Ion: 1.15 mmol/L (ref 1.15–1.40)
Chloride: 97 mmol/L — ABNORMAL LOW (ref 98–111)
Creatinine, Ser: 1.3 mg/dL — ABNORMAL HIGH (ref 0.44–1.00)
Glucose, Bld: 674 mg/dL (ref 70–99)
HCT: 36 % (ref 36.0–46.0)
Hemoglobin: 12.2 g/dL (ref 12.0–15.0)
Potassium: 4.4 mmol/L (ref 3.5–5.1)
Sodium: 131 mmol/L — ABNORMAL LOW (ref 135–145)
TCO2: 20 mmol/L — ABNORMAL LOW (ref 22–32)

## 2024-02-13 LAB — URINALYSIS, ROUTINE W REFLEX MICROSCOPIC
Bacteria, UA: NONE SEEN
Bilirubin Urine: NEGATIVE
Glucose, UA: >=500 mg/dL — AB
Hgb urine dipstick: NEGATIVE
Ketones, ur: NEGATIVE mg/dL
Leukocytes,Ua: NEGATIVE
Nitrite: NEGATIVE
Protein, ur: NEGATIVE mg/dL
Specific Gravity, Urine: 1.025 (ref 1.005–1.030)
pH: 5 (ref 5.0–8.0)

## 2024-02-13 LAB — CBC
HCT: 33.5 % — ABNORMAL LOW (ref 36.0–46.0)
Hemoglobin: 11.3 g/dL — ABNORMAL LOW (ref 12.0–15.0)
MCH: 30 pg (ref 26.0–34.0)
MCHC: 33.7 g/dL (ref 30.0–36.0)
MCV: 88.9 fL (ref 80.0–100.0)
Platelets: 508 10*3/uL — ABNORMAL HIGH (ref 150–400)
RBC: 3.77 MIL/uL — ABNORMAL LOW (ref 3.87–5.11)
RDW: 12 % (ref 11.5–15.5)
WBC: 10.2 10*3/uL (ref 4.0–10.5)
nRBC: 0 % (ref 0.0–0.2)

## 2024-02-13 LAB — I-STAT VENOUS BLOOD GAS, ED
Acid-base deficit: 3 mmol/L — ABNORMAL HIGH (ref 0.0–2.0)
Bicarbonate: 20.8 mmol/L (ref 20.0–28.0)
Calcium, Ion: 1.12 mmol/L — ABNORMAL LOW (ref 1.15–1.40)
HCT: 33 % — ABNORMAL LOW (ref 36.0–46.0)
Hemoglobin: 11.2 g/dL — ABNORMAL LOW (ref 12.0–15.0)
O2 Saturation: 77 %
Potassium: 4.8 mmol/L (ref 3.5–5.1)
Sodium: 133 mmol/L — ABNORMAL LOW (ref 135–145)
TCO2: 22 mmol/L (ref 22–32)
pCO2, Ven: 30.7 mmHg — ABNORMAL LOW (ref 44–60)
pH, Ven: 7.439 — ABNORMAL HIGH (ref 7.25–7.43)
pO2, Ven: 40 mmHg (ref 32–45)

## 2024-02-13 MED ORDER — INSULIN ASPART 100 UNIT/ML IJ SOLN
8.0000 [IU] | Freq: Once | INTRAMUSCULAR | Status: AC
  Administered 2024-02-13: 8 [IU] via SUBCUTANEOUS

## 2024-02-13 MED ORDER — SODIUM CHLORIDE 0.9 % IV BOLUS
1000.0000 mL | Freq: Once | INTRAVENOUS | Status: AC
  Administered 2024-02-13: 1000 mL via INTRAVENOUS

## 2024-02-13 NOTE — ED Provider Notes (Signed)
 [place] EMERGENCY DEPARTMENT AT [facility] Provider Note   CSN: [id number] Arrival date & time: 02/13/24  1022     Patient presents with: No chief complaint on file.   Carla Brooks is a 53 y.o. female.   HPI   53 year old type II diabetic presents emergency department concern for elevated blood sugars.  Patient states that she has been taking her oral medication without difficulty.  No missed doses or recent dosage changes.  She admits to increased thirst, increased urination, mild nausea.  She denies any fever, vomiting, diarrhea or other recent illness.  She feels generally fatigued.  Prior to Admission medications   Medication Sig Start Date End Date Taking? Authorizing Provider  albuterol  (VENTOLIN  HFA) 108 (90 Base) MCG/ACT inhaler Inhale 2 puffs into the lungs every 6 (six) hours as needed for shortness of breath. 12/04/20   [provider]  amLODipine  (NORVASC ) 5 MG tablet Take 5 mg by mouth daily.    [provider]  aspirin  EC 81 MG tablet Take 1 tablet (81 mg total) by mouth daily. Swallow whole. 05/08/23   Tolia, Sunit, DO  busPIRone  (BUSPAR ) 15 MG tablet Take 1.5 tablets (22.5 mg total) by mouth 2 (two) times daily. 02/03/24   Nwoko, Uchenna E, PA  clonazePAM  (KLONOPIN ) 0.5 MG tablet Take 2 tablets (1 mg total) by mouth 2 (two) times daily as needed for anxiety. 07/10/23   Nwoko, Uchenna E, PA  EPINEPHrine  (EPIPEN ) 0.3 mg/0.3 mL DEVI Inject 0.3 mLs (0.3 mg total) into the muscle as needed. Use as directed in the event of a severe allergic reaction 03/02/12   Steinl, Kevin, MD  gabapentin  (NEURONTIN ) 300 MG capsule Take 300 mg by mouth 3 (three) times daily.    [provider]  hydrOXYzine  (VISTARIL ) 25 MG capsule Take 2-4 capsules (50-100 mg total) by mouth 2 (two) times daily as needed for anxiety. 02/03/24   Nwoko, Uchenna E, PA  JARDIANCE 10 MG TABS tablet Take 10 mg by mouth daily.    [provider]  labetalol  (NORMODYNE )  300 MG tablet Take 300 mg by mouth 2 (two) times daily.    [provider]  lamoTRIgine  (LAMICTAL ) 100 MG tablet Take 1 tablet (100 mg total) by mouth 2 (two) times daily. 02/03/24   Nwoko, Uchenna E, PA  losartan (COZAAR) 25 MG tablet Take 12.5 mg by mouth 2 (two) times daily.    [provider]  lurasidone  (LATUDA ) 20 MG TABS tablet Patient to take Lurasidone  20 mg for 6 days, then continue taking 40 mg daily. 02/04/24   Nwoko, Uchenna E, PA  lurasidone  (LATUDA ) 40 MG TABS tablet Take 1 tablet (40 mg total) by mouth daily with breakfast. 02/10/24   Nwoko, Uchenna E, PA  meclizine (ANTIVERT) 25 MG tablet Take 1 tablet by mouth as needed. 07/03/22   [provider]  metFORMIN  (GLUCOPHAGE ) 500 MG tablet Take 1 tablet (500 mg total) by mouth 2 (two) times daily with a meal. Patient taking differently: Take 0.5 tablets by mouth 2 (two) times daily with a meal. 03/18/23 05/08/23  Clem Currier, DO  omeprazole  (PRILOSEC) 20 MG capsule Take 20 mg by mouth 2 (two) times daily.    [provider]  prochlorperazine  (COMPAZINE ) 5 MG tablet Take 1 tablet (5 mg total) by mouth every 6 (six) hours as needed for nausea or vomiting. 03/18/23   Clem Currier, DO  rosuvastatin  (CRESTOR ) 5 MG tablet Take 1 tablet (5 mg total) by mouth  daily. 12/09/23   Tolia, Sunit, DO  sertraline  (ZOLOFT ) 50 MG tablet Take 1 tablet (50 mg total) by mouth daily. 02/03/24   Nwoko, Uchenna E, PA  tiZANidine (ZANAFLEX) 4 MG tablet Take 4 mg by mouth every 6 (six) hours as needed for muscle spasms.    [provider]  valbenazine  (INGREZZA ) 40 MG capsule Take 1 capsule (40 mg total) by mouth daily. 02/03/24   Nwoko, Uchenna E, PA    Allergies: Shrimp [shellfish allergy], Hydrocodone , Penicillins, and Pork-derived products    Review of Systems  Constitutional:  Positive for appetite change and fatigue. Negative for fever.  Respiratory:  Negative for shortness of breath.   Cardiovascular:  Negative for  chest pain.  Gastrointestinal:  Positive for nausea. Negative for abdominal pain, diarrhea and vomiting.  Endocrine: Positive for polydipsia and polyuria.  Skin:  Negative for rash.  Neurological:  Negative for headaches.    Updated Vital Signs BP 134/84   Pulse (!) 103   Temp 99.2 F (37.3 C) (Oral)   Resp (!) 21   Ht 5' 6 (1.676 m)   Wt 99.5 kg   LMP 04/02/2017 (Approximate)   SpO2 100%   BMI 35.41 kg/m   Physical Exam Vitals and nursing note reviewed.  Constitutional:      General: She is not in acute distress.    Appearance: Normal appearance.  HENT:     Head: Normocephalic.     Mouth/Throat:     Mouth: Mucous membranes are moist.   Cardiovascular:     Rate and Rhythm: Normal rate.  Pulmonary:     Effort: Pulmonary effort is normal. No respiratory distress.  Abdominal:     Palpations: Abdomen is soft.     Tenderness: There is no abdominal tenderness.   Skin:    General: Skin is warm.   Neurological:     Mental Status: She is alert and oriented to person, place, and time. Mental status is at baseline.   Psychiatric:        Mood and Affect: Mood normal.     (all labs ordered are listed, but only abnormal results are displayed) Labs Reviewed  CBC - Abnormal; Notable for the following components:      Result Value   RBC 3.77 (*)    Hemoglobin 11.3 (*)    HCT 33.5 (*)    Platelets 508 (*)    All other components within normal limits  CBG MONITORING, ED - Abnormal; Notable for the following components:   Glucose-Capillary >600 (*)    All other components within normal limits  I-STAT CHEM 8, ED - Abnormal; Notable for the following components:   Sodium 131 (*)    Chloride 97 (*)    Creatinine, Ser 1.30 (*)    Glucose, Bld 674 (*)    TCO2 20 (*)    All other components within normal limits  URINALYSIS, ROUTINE W REFLEX MICROSCOPIC  COMPREHENSIVE METABOLIC PANEL WITH GFR  CBG MONITORING, ED  I-STAT VENOUS BLOOD GAS, ED     EKG: None  Radiology: No results found.   Procedures   Medications Ordered in the ED - No data to display                                  Medical Decision Making Amount and/or Complexity of Data Reviewed Labs: ordered.  Risk Prescription drug management.   53 year old female presents emergency room  with concern for elevated blood sugar, increased thirst and urination.  Tachycardic on arrival but stable blood pressure.  No fever or acute symptoms of infection.  Blood work shows hyperglycemia of 617 with a mild pseudohyponatremia, potassium is stable.  Bicarb slightly decreased at 21.  VBG shows pH of 7.4, urinalysis shows no infection.  No findings of DKA/HHS.  Plan for hydration and addressing glucose.  After treatment repeat fingerstick is in the low 300s.  Patient feels well and has tolerated p.o.  I encouraged oral rehydration and compliance with metformin  outpatient follow-up with her primary doctor for medication reevaluation.  As an aside the patient now endorses intermittent blurred vision, in both of her eyes.  She has lost follow-up with her optometrist.  Will give recommendations for diabetic retina specialist and plan for outpatient follow-up.  Patient at this time appears safe and stable for discharge and close outpatient follow up. Discharge plan and strict return to ED precautions discussed, patient verbalizes understanding and agreement.     Final diagnoses:  None    ED Discharge Orders     None          Flonnie Humphrey, DO 02/13/24 1523

## 2024-02-13 NOTE — Inpatient Diabetes Management (Signed)
 Inpatient Diabetes Program Recommendations  AACE/ADA: New Consensus Statement on Inpatient Glycemic Control (2015)  Target Ranges:  Prepandial:   less than 140 mg/dL      Peak postprandial:   less than 180 mg/dL (1-2 hours)      Critically ill patients:  140 - 180 mg/dL   Lab Results  Component Value Date   GLUCAP >600 (HH) 02/13/2024   HGBA1C 5.9 (H) 03/17/2023    Latest Reference Range & Units Most Recent  Sodium 135 - 145 mmol/L 131 (L) 02/13/24 10:41  Potassium 3.5 - 5.1 mmol/L 4.4 02/13/24 10:41  Chloride 98 - 111 mmol/L 97 (L) 02/13/24 10:41  CO2 22 - 32 mmol/L 20 (L) 03/18/23 02:11  Glucose 70 - 99 mg/dL 161 (HH) 0/96/04 54:09  Mean Plasma Glucose mg/dL 811.91 4/78/29 56:21  BUN 6 - 20 mg/dL 18 11/08/63 78:46  Creatinine 0.44 - 1.00 mg/dL 9.62 (H) 9/52/84 13:24  Calcium  8.9 - 10.3 mg/dL 8.7 (L) 12/01/00 72:53  Anion gap 5 - 15  10 03/18/23 02:11  eGFR  65.0 (E) 07/03/23 11:21  Calcium  Ionized 1.15 - 1.40 mmol/L 1.15 02/13/24 10:41  Magnesium  1.7 - 2.4 mg/dL 1.9 6/64/40 34:74  Alkaline Phosphatase 38 - 126 U/L 100 03/18/23 02:11  Albumin 3.5 - 5.0 g/dL 3.4 (L) 2/59/56 38:75  Lipase 11 - 51 U/L 44 03/17/23 08:35  AST 15 - 41 U/L 38 03/18/23 02:11  ALT 0 - 44 U/L 94 (H) 03/18/23 02:11  Total Protein 6.5 - 8.1 g/dL 6.1 (L) 6/43/32 95:18  Bilirubin, Direct 0.0 - 0.3 mg/dL <8.4 1/66/06 30:16  Indirect Bilirubin 0.3 - 0.9 mg/dL NOT CALCULATED 0/10/93 01:58  Total Bilirubin 0.3 - 1.2 mg/dL 0.8 2/35/57 32:20  GFR, Estimated >60 mL/min 40 (L) 03/18/23 02:11  GFR, Est Non African American >60 mL/min >60 12/10/16 23:38  (HH): Data is critically high (L): Data is abnormally low (H): Data is abnormally high (E): External lab result  Latest Reference Range & Units 02/13/24 10:41  Potassium 3.5 - 5.1 mmol/L 4.4   Diabetes history: DM2 Outpatient Diabetes medications: Jardiance 10 mg daily, Metformin  250 mg bid Current orders for Inpatient glycemic control: None yet    Inpatient Diabetes Program Recommendations:   Reviewed history of patient: hospitalized 03/17/23 with DKA while on Jardiance and Metformin . Agree with plan to hold Jardiance and may need to discontinue @ discharge as well.  Thank you, Carla Brooks E. Carla Corzine, RN, MSN, CDCES  Diabetes Coordinator Inpatient Glycemic Control Team Team Pager [phone] (8am-5pm) 02/13/2024 12:19 PM

## 2024-02-13 NOTE — Discharge Instructions (Signed)
 You have been seen and discharged from the emergency department.  You were found to have elevated blood sugar but no other complications.  You were treated with IV fluids and insulin .  Your blood sugar has downtrended.  Continue to stay hydrated with clear liquids and to be compliant with your metformin .  Follow-up with your primary provider for further evaluation and further care. Take home medications as prescribed. If you have any worsening symptoms or further concerns for your health please return to an emergency department for further evaluation.

## 2024-02-13 NOTE — ED Triage Notes (Signed)
 Arrive via EMS from, pt states her blood sugar is reading high x3, with increased thirst and urination. EMS CBG was also read high.

## 2024-02-14 ENCOUNTER — Other Ambulatory Visit: Payer: Self-pay

## 2024-02-14 ENCOUNTER — Encounter (HOSPITAL_COMMUNITY): Payer: Self-pay

## 2024-02-14 ENCOUNTER — Emergency Department (HOSPITAL_COMMUNITY)
Admission: EM | Admit: 2024-02-14 | Discharge: 2024-02-14 | Disposition: A | Discharge status: Home / Self Care | Payer: MEDICAID | Attending: Emergency Medicine | Admitting: Emergency Medicine

## 2024-02-14 DIAGNOSIS — R739 Hyperglycemia, unspecified: Secondary | ICD-10-CM

## 2024-02-14 DIAGNOSIS — Z7984 Long term (current) use of oral hypoglycemic drugs: Secondary | ICD-10-CM | POA: Insufficient documentation

## 2024-02-14 DIAGNOSIS — E1165 Type 2 diabetes mellitus with hyperglycemia: Secondary | ICD-10-CM | POA: Insufficient documentation

## 2024-02-14 DIAGNOSIS — I10 Essential (primary) hypertension: Secondary | ICD-10-CM | POA: Diagnosis not present

## 2024-02-14 LAB — COMPREHENSIVE METABOLIC PANEL WITH GFR
ALT: 16 U/L (ref 0–44)
AST: 14 U/L — ABNORMAL LOW (ref 15–41)
Albumin: 3.6 g/dL (ref 3.5–5.0)
Alkaline Phosphatase: 107 U/L (ref 38–126)
Anion gap: 9 (ref 5–15)
BUN: 14 mg/dL (ref 6–20)
CO2: 23 mmol/L (ref 22–32)
Calcium: 9 mg/dL (ref 8.9–10.3)
Chloride: 100 mmol/L (ref 98–111)
Creatinine, Ser: 1.51 mg/dL — ABNORMAL HIGH (ref 0.44–1.00)
GFR, Estimated: 42 mL/min — ABNORMAL LOW (ref >60)
Glucose, Bld: 559 mg/dL (ref 70–99)
Potassium: 4.4 mmol/L (ref 3.5–5.1)
Sodium: 132 mmol/L — ABNORMAL LOW (ref 135–145)
Total Bilirubin: 0.5 mg/dL (ref 0.0–1.2)
Total Protein: 6.7 g/dL (ref 6.5–8.1)

## 2024-02-14 LAB — CBC WITH DIFFERENTIAL/PLATELET
Abs Immature Granulocytes: 0.04 10*3/uL (ref 0.00–0.07)
Basophils Absolute: 0.1 10*3/uL (ref 0.0–0.1)
Basophils Relative: 1 %
Eosinophils Absolute: 0.1 10*3/uL (ref 0.0–0.5)
Eosinophils Relative: 1 %
HCT: 30.2 % — ABNORMAL LOW (ref 36.0–46.0)
Hemoglobin: 10.2 g/dL — ABNORMAL LOW (ref 12.0–15.0)
Immature Granulocytes: 0 %
Lymphocytes Relative: 18 %
Lymphs Abs: 1.9 10*3/uL (ref 0.7–4.0)
MCH: 29.7 pg (ref 26.0–34.0)
MCHC: 33.8 g/dL (ref 30.0–36.0)
MCV: 88 fL (ref 80.0–100.0)
Monocytes Absolute: 0.9 10*3/uL (ref 0.1–1.0)
Monocytes Relative: 8 %
Neutro Abs: 7.5 10*3/uL (ref 1.7–7.7)
Neutrophils Relative %: 72 %
Platelets: 430 10*3/uL — ABNORMAL HIGH (ref 150–400)
RBC: 3.43 MIL/uL — ABNORMAL LOW (ref 3.87–5.11)
RDW: 12.1 % (ref 11.5–15.5)
WBC: 10.5 10*3/uL (ref 4.0–10.5)
nRBC: 0 % (ref 0.0–0.2)

## 2024-02-14 LAB — I-STAT CHEM 8, ED
BUN: 14 mg/dL (ref 6–20)
Calcium, Ion: 1.19 mmol/L (ref 1.15–1.40)
Chloride: 99 mmol/L (ref 98–111)
Creatinine, Ser: 1.4 mg/dL — ABNORMAL HIGH (ref 0.44–1.00)
Glucose, Bld: 500 mg/dL — ABNORMAL HIGH (ref 70–99)
HCT: 29 % — ABNORMAL LOW (ref 36.0–46.0)
Hemoglobin: 9.9 g/dL — ABNORMAL LOW (ref 12.0–15.0)
Potassium: 4.9 mmol/L (ref 3.5–5.1)
Sodium: 135 mmol/L (ref 135–145)
TCO2: 20 mmol/L — ABNORMAL LOW (ref 22–32)

## 2024-02-14 LAB — I-STAT VENOUS BLOOD GAS, ED
Acid-base deficit: 4 mmol/L — ABNORMAL HIGH (ref 0.0–2.0)
Bicarbonate: 21.7 mmol/L (ref 20.0–28.0)
Calcium, Ion: 1.17 mmol/L (ref 1.15–1.40)
HCT: 29 % — ABNORMAL LOW (ref 36.0–46.0)
Hemoglobin: 9.9 g/dL — ABNORMAL LOW (ref 12.0–15.0)
O2 Saturation: 84 %
Potassium: 4.9 mmol/L (ref 3.5–5.1)
Sodium: 135 mmol/L (ref 135–145)
TCO2: 23 mmol/L (ref 22–32)
pCO2, Ven: 40 mmHg — ABNORMAL LOW (ref 44–60)
pH, Ven: 7.342 (ref 7.25–7.43)
pO2, Ven: 51 mmHg — ABNORMAL HIGH (ref 32–45)

## 2024-02-14 LAB — URINALYSIS, ROUTINE W REFLEX MICROSCOPIC
Bacteria, UA: NONE SEEN
Bilirubin Urine: NEGATIVE
Glucose, UA: >=500 mg/dL — AB
Ketones, ur: NEGATIVE mg/dL
Nitrite: NEGATIVE
Protein, ur: NEGATIVE mg/dL
Specific Gravity, Urine: 1.021 (ref 1.005–1.030)
pH: 6 (ref 5.0–8.0)

## 2024-02-14 LAB — CBG MONITORING, ED
Glucose-Capillary: 515 mg/dL (ref 70–99)
Glucose-Capillary: 387 mg/dL — ABNORMAL HIGH (ref 70–99)
Glucose-Capillary: 291 mg/dL — ABNORMAL HIGH (ref 70–99)

## 2024-02-14 MED ORDER — SODIUM CHLORIDE 0.9 % IV BOLUS: 1000.0000 mL | Freq: Once | INTRAVENOUS | Status: AC

## 2024-02-14 MED ORDER — EMPAGLIFLOZIN 10 MG PO TABS
10.0000 mg | ORAL_TABLET | Freq: Every day | ORAL | 0 refills | Status: AC
Start: 2024-02-14 — End: ?

## 2024-02-14 MED ORDER — INSULIN ASPART 100 UNIT/ML IJ SOLN: 6.0000 [IU] | Freq: Once | INTRAMUSCULAR | Status: AC

## 2024-02-14 MED ORDER — EMPAGLIFLOZIN 10 MG PO TABS
10.0000 mg | ORAL_TABLET | Freq: Every day | ORAL | Status: DC
  Administered 2024-02-14: 10 mg via ORAL
  Filled 2024-02-14: qty 1

## 2024-02-14 NOTE — ED Triage Notes (Signed)
 Pt BIB [facility] from home for hyperglycemia. Pt reading High on glucometer per EMS. Pt was discharged from ED yesterday for hyperglycemia but is high again today. Pt endorses polyuria, polydipsia, blurred vision. Denies recent illness, diet changes. VSS per EMS.

## 2024-02-14 NOTE — ED Provider Notes (Signed)
 Carla Brooks Provider Note   CSN: [id number] Arrival date & time: 02/14/24  1729     History Chief Complaint  Patient presents with   Hyperglycemia    Carla Brooks is a 53 y.o. female w/ PMHx DM II, GERD, hypertension, who presents to the ED for evaluation of hyperglycemia.  She reports she was seen yesterday for hyperglycemia and received insulin  and fluids and was discharged home.  Patient scheduled follow-up with her PCP but unable to see them until June 30.  She reports Foley to p.o. and polyuria.  She reports home glucometer read hide again today. Patient reports she is currently on metformin  for type 2 diabetes.  She reports that she was on Jardiance at 1 point but that was discontinued by her physician.  She denies any recent medication changes.  No recent steroid use.  No recent illnesses.       Physical Exam Updated Vital Signs BP (!) 163/95   Pulse 80   Temp 98.2 F (36.8 C) (Oral)   Resp 18   LMP 04/02/2017 (Approximate)   SpO2 98%  Physical Exam Vitals and nursing note reviewed.  Constitutional:      General: She is not in acute distress.    Appearance: Normal appearance. She is well-developed. She is not ill-appearing.  HENT:     Mouth/Throat:     Mouth: Mucous membranes are moist.   Cardiovascular:     Rate and Rhythm: Normal rate.     Pulses: Normal pulses.     Heart sounds: Normal heart sounds.  Pulmonary:     Effort: Pulmonary effort is normal.     Breath sounds: Normal breath sounds.   Skin:    General: Skin is warm and dry.     Capillary Refill: Capillary refill takes less than 2 seconds.   Neurological:     Mental Status: She is alert.   Psychiatric:        Mood and Affect: Mood normal.     ED Results / Procedures / Treatments   Labs (all labs ordered are listed, but only abnormal results are displayed) Labs Reviewed  COMPREHENSIVE METABOLIC PANEL WITH GFR - Abnormal; Notable for the  following components:      Result Value   Sodium 132 (*)    Glucose, Bld 559 (*)    Creatinine, Ser 1.51 (*)    AST 14 (*)    GFR, Estimated 42 (*)    All other components within normal limits  CBC WITH DIFFERENTIAL/PLATELET - Abnormal; Notable for the following components:   RBC 3.43 (*)    Hemoglobin 10.2 (*)    HCT 30.2 (*)    Platelets 430 (*)    All other components within normal limits  URINALYSIS, ROUTINE W REFLEX MICROSCOPIC - Abnormal; Notable for the following components:   Color, Urine STRAW (*)    Glucose, UA >=500 (*)    Hgb urine dipstick SMALL (*)    Leukocytes,Ua SMALL (*)    All other components within normal limits  I-STAT CHEM 8, ED - Abnormal; Notable for the following components:   Creatinine, Ser 1.40 (*)    Glucose, Bld 500 (*)    TCO2 20 (*)    Hemoglobin 9.9 (*)    HCT 29.0 (*)    All other components within normal limits  I-STAT VENOUS BLOOD GAS, ED - Abnormal; Notable for the following components:   pCO2, Ven 40.0 (*)  pO2, Ven 51 (*)    Acid-base deficit 4.0 (*)    HCT 29.0 (*)    Hemoglobin 9.9 (*)    All other components within normal limits  CBG MONITORING, ED - Abnormal; Notable for the following components:   Glucose-Capillary 515 (*)    All other components within normal limits  CBG MONITORING, ED - Abnormal; Notable for the following components:   Glucose-Capillary 387 (*)    All other components within normal limits  CBG MONITORING, ED - Abnormal; Notable for the following components:   Glucose-Capillary 291 (*)    All other components within normal limits  HEMOGLOBIN A1C    EKG None  Radiology No results found.  Medications Ordered in ED Medications  empagliflozin (JARDIANCE) tablet 10 mg (10 mg Oral Given 02/14/24 1938)  sodium chloride  0.9 % bolus 1,000 mL (0 mLs Intravenous Stopped 02/14/24 1938)  insulin  aspart (novoLOG ) injection 6 Units (6 Units Subcutaneous Given 02/14/24 1846)  sodium chloride  0.9 % bolus 1,000 mL (0  mLs Intravenous Stopped 02/14/24 2042)    ED Course/ Medical Decision Making/ A&P  Carla Brooks is a 53 y.o. female presents as detailed above  Differential ddx: Uncontrolled type 2 diabetes, DKA, HHS  On arrival, patient afebrile hemodynamically stable no hypoxia or respiratory distress.  ED Work-up: Please see details of labs and imaging listed above.  Point-of-care glucose 515 on arrival.   Patient received approximately 200 cc of fluid with EMS.   Will initiate additional normal saline fluid bolus and 6 units of insulin  NovoLog .    Patient with slightly elevated creatinine 1.51 increased from 1.39 yesterday.  Will administer additional liter of fluid.  No leukocytosis or significant change in hemoglobin from baseline.  No other significant electrolyte abnormalities.  Urinalysis without signs of infection.  Urinalysis without ketones or protein.  Patient is not acidotic on stat VBG. Hgb A1C in process  Repeat glucose after initial liter fluid and 1 hour after 6 units of NovoLog  387. Discussed restarting Jardiance at previous dose with patient at bedside who agrees with plan.  Initial dose of Jardiance administered in ED.  Repeat point-of-care glucose after second liter of fluid 291   Overall impression uncontrolled DM II   Patient stable for discharge and outpatient follow-up. Strict return precautions provided. Patient voices understanding and agrees with plan.   Patient seen with supervising physician who agrees with plan.  Final Clinical Impression(s) / ED Diagnoses Final diagnoses:  Hyperglycemia    Rx / DC Orders ED Discharge Orders          Ordered    empagliflozin (JARDIANCE) 10 MG TABS tablet  Daily before breakfast        02/14/24 1939            Angele Keller, DO PGY-3 Emergency Medicine    Angele Keller, DO 02/14/24 2305    Lowery Rue, DO 02/14/24 2306

## 2024-02-14 NOTE — ED Notes (Signed)
 Gave pt and visitor blanket

## 2024-02-14 NOTE — ED Provider Notes (Signed)
 Supervised resident visit.  Patient here with elevated blood sugar.  Seen here yesterday for the same.  Unremarkable vitals.  No fever.  History of diabetes.  Had been on metformin  and Jardiance but A1c had improved and now just on metformin .  But here recently blood sugars been elevated.  Today blood sugars 515.  Will give her IV fluids and check for DKA.  Per my review interpretation of labs patient is not in DKA however blood sugar is 500.  Will give her insulin  in addition IV fluids and try to get the blood sugar down as much as we can today.  Sounds like she was able to afford Jardiance and her insurance covered and she did well with it.  I talked with pharmacy and ultimately shared decision was made to restart her on Jardiance and have her follow-up with her primary care doctor.  Anticipate discharge close follow-up with PCP.  Told to return if blood sugar is not being controlled then we might need to talk about insulin .  This chart was dictated using voice recognition software.  Despite best efforts to proofread,  errors can occur which can change the documentation meaning.    Lowery Rue, DO 02/14/24 1950

## 2024-02-14 NOTE — Discharge Instructions (Signed)
 Please take metformin  1000mg  2 times a day.  Please take Jardiance 10 mg daily.  Please continue hydration with water.  Please follow-up with your primary care doctor soon as possible. Thank you for allowing us  to take care of you today.  We hope you begin feeling better soon.   To-Do:  Please follow-up with your primary doctor within the next 2-3 days. Please return to the Emergency Department or call 911 if you experience chest pain, shortness of breath, severe pain, severe fever, altered mental status, or have any reason to think that you need emergency medical care.  Thank you again.  Hope you feel better soon.  [facility] of Emergency Medicine

## 2024-03-23 ENCOUNTER — Ambulatory Visit (INDEPENDENT_AMBULATORY_CARE_PROVIDER_SITE_OTHER): Payer: MEDICAID | Admitting: Physician Assistant

## 2024-03-23 VITALS — BP 117/67 | HR 95 | Temp 98.0°F | Ht 66.0 in | Wt 212.0 lb

## 2024-03-23 DIAGNOSIS — F3132 Bipolar disorder, current episode depressed, moderate: Secondary | ICD-10-CM | POA: Diagnosis not present

## 2024-03-23 DIAGNOSIS — Z79899 Other long term (current) drug therapy: Secondary | ICD-10-CM

## 2024-03-23 DIAGNOSIS — F4310 Post-traumatic stress disorder, unspecified: Secondary | ICD-10-CM | POA: Diagnosis not present

## 2024-03-23 DIAGNOSIS — G2401 Drug induced subacute dyskinesia: Secondary | ICD-10-CM

## 2024-03-23 DIAGNOSIS — F411 Generalized anxiety disorder: Secondary | ICD-10-CM

## 2024-03-23 MED ORDER — LUMATEPERONE TOSYLATE 42 MG PO CAPS: 42.0000 mg | ORAL_CAPSULE | Freq: Every day | ORAL | 1 refills | Status: DC

## 2024-03-23 MED ORDER — HYDROXYZINE PAMOATE 25 MG PO CAPS: 50.0000 mg | ORAL_CAPSULE | Freq: Two times a day (BID) | ORAL | 1 refills | Status: DC | PRN

## 2024-03-23 MED ORDER — BUSPIRONE HCL 15 MG PO TABS: 22.5000 mg | ORAL_TABLET | Freq: Two times a day (BID) | ORAL | 1 refills | Status: DC

## 2024-03-23 MED ORDER — SERTRALINE HCL 50 MG PO TABS: 50.0000 mg | ORAL_TABLET | Freq: Every day | ORAL | 1 refills | Status: DC

## 2024-03-23 MED ORDER — LAMOTRIGINE 100 MG PO TABS: 100.0000 mg | ORAL_TABLET | Freq: Two times a day (BID) | ORAL | 1 refills | Status: DC

## 2024-03-24 DIAGNOSIS — Z79899 Other long term (current) drug therapy: Secondary | ICD-10-CM | POA: Insufficient documentation

## 2024-03-24 NOTE — Progress Notes (Signed)
 [facility] MD/PA/NP OP Progress Note  03/23/2024 11:00 AM Carla Brooks  MRN:  [id number]  Chief Complaint:  Chief Complaint  Patient presents with   Follow-up   Medication Management   HPI:   Carla Brooks is a 53 year old, African-American female with a past psychiatric history significant for generalized anxiety disorder, bipolar disorder (depressed, moderate), and PTSD who presents to [facility] for follow up and medication management.  Patient was last seen by Zane CHARLENA Bach, NP on 12/15/2023.  During her last encounter, patient was being managed on the following psychiatric medications:  Hydroxyzine  25 mg 2 to 4 capsules by mouth (50 to 100 mg total) 2 times daily Sertraline  50 mg daily Lamotrigine  100 mg 2 times daily Cariprazine  3 mg daily Buspirone  22.5 mg 2 times daily  Patient presents to the encounter stating that she had to discontinue her use of Latuda  due to the medication increasing her glucose levels.  Patient reports that she was recently sent to the hospital during the last week of June due to an elevated blood glucose of 589 to 600 g/dL.  Patient reports that she was taken off Latuda  and her metformin  medication dosage was increased.  Patient continues to endorse depression and rates her depression an 8 out of 10 with 10 being most severe.  Patient endorses depressive episodes 3 to 4 days out of the week.  Patient endorses the following depressive symptoms: feeling of sadness, lack of motivation, decreased concentration, irritability, feelings of guilt/worthlessness, and hopelessness.  In addition to her depression, patient endorses anxiety and rates her anxiety as 7 out of 10.  Patient endorses anxiety 2 to 3 days out of the week but denies any new stressors at this time.  A PHQ-9 screen was performed with the patient scoring a 17.  A GAD-7 screen was also performed with the patient scoring a 15.  Patient is alert and oriented  x 4, calm, cooperative, and fully engaged in conversation during the encounter.  Patient describes her mood as sad.  Patient exhibits depressed mood with congruent affect.  Patient denies suicidal or homicidal ideations.  She further denies auditory or visual hallucinations and does not appear to be responding to internal/external stimuli.  Patient endorses fair sleep and receives on average 7 hours of intermittent sleep per night.  Patient reports that she often wakes up through the night.  Patient endorses good appetite needs on average 3 meals per day along with a snack.  Patient denies alcohol consumption, tobacco use, or illicit drug use.  Visit Diagnosis:    ICD-10-CM   1. Long term current use of antipsychotic medication  Z79.899 Lipid panel    Lipid panel    Ambulatory referral to Cardiology    2. Bipolar 1 disorder, depressed, moderate (HCC)  F31.32 lumateperone  tosylate (CAPLYTA ) 42 MG capsule    lamoTRIgine  (LAMICTAL ) 100 MG tablet    sertraline  (ZOLOFT ) 50 MG tablet    3. GAD (generalized anxiety disorder)  F41.1 busPIRone  (BUSPAR ) 15 MG tablet    sertraline  (ZOLOFT ) 50 MG tablet    hydrOXYzine  (VISTARIL ) 25 MG capsule    4. PTSD (post-traumatic stress disorder)  F43.10 sertraline  (ZOLOFT ) 50 MG tablet    5. Tardive dyskinesia  G24.01      Past Psychiatric History:  Patient endorses a past psychiatric history significant for bipolar disorder (bipolar 1 versus bipolar 2), generalized anxiety disorder, depression, and PTSD   Patient denies a past history of hospitalization due to  mental health   Patient denies a past history of suicide attempt   Patient denies a past history of homicide attempt  Past Medical History:  Past Medical History:  Diagnosis Date   Abnormal Pap smear    Anemia 02/21/2012   Arthritis    Asthma    Bronchitis    Chronic hypertension with superimposed preeclampsia 06/01/2012   Diabetes mellitus (HCC) 02/21/2012   Fibroid    GBS (group B  streptococcus) UTI complicating pregnancy 06/04/2012   Needs treatment in labor-resistant to Clinda and Erythromycin!     GERD (gastroesophageal reflux disease)    HTN (hypertension) 02/21/2012   Migraines 02/21/2012   Uterine fibroids affecting pregnancy, antepartum 07/27/2012    Past Surgical History:  Procedure Laterality Date   CESAREAN SECTION N/A 10/29/2012   Procedure: CESAREAN SECTION;  Surgeon: Vonn VEAR Inch, MD;  Location: [facility];  Service: Obstetrics;  Laterality: N/A;   CHOLECYSTECTOMY  09/27/2001    Family Psychiatric History:  Uncle - schizophrenia, patient reports that he was taking lithium   Family history of suicide attempts: Patient denies Family history of homicide attempts: Patient denies Family history of substance abuse: Patient reports that her son uses marijuana  Family History:  Family History  Problem Relation Age of Onset   Heart disease Father    Coronary artery disease Father    Cancer Paternal Uncle        throat   Diabetes Maternal Grandmother    Cancer Maternal Grandfather        prostate   Cancer Paternal Grandfather        lung    Social History:  Social History   Socioeconomic History   Marital status: Single    Spouse name: Not on file   Number of children: 3   Years of education: Not on file   Highest education level: Not on file  Occupational History   Not on file  Tobacco Use   Smoking status: Never   Smokeless tobacco: Never  Vaping Use   Vaping status: Never Used  Substance and Sexual Activity   Alcohol use: Not Currently    Comment: occasional   Drug use: No   Sexual activity: Not Currently    Birth control/protection: None  Other Topics Concern   Not on file  Social History Narrative   Not on file   Social Drivers of Health   Financial Resource Strain: Medium Risk (02/16/2024)   Received from [facility]   Overall Financial Resource Strain (CARDIA)    Difficulty of Paying Living Expenses: Somewhat hard  Food  Insecurity: Food Insecurity Present (02/16/2024)   Received from [facility]   Hunger Vital Sign    Within the past 12 months, you worried that your food would run out before you got the money to buy more.: Often true    Within the past 12 months, the food you bought just didn't last and you didn't have money to get more.: Sometimes true  Transportation Needs: No Transportation Needs (02/16/2024)   Received from [facility] - Transportation    Lack of Transportation (Medical): No    Lack of Transportation (Non-Medical): No  Physical Activity: Inactive (02/16/2024)   Received from [facility]   Exercise Vital Sign    On average, how many days per week do you engage in moderate to strenuous exercise (like a brisk walk)?: 0 days    On average, how many minutes do you engage in exercise at  this level?: 0 min  Stress: Stress Concern Present (02/16/2024)   Received from [facility] - Occupational Stress Questionnaire    Feeling of Stress : Rather much  Social Connections: Socially Integrated (02/16/2024)   Received from [facility]   Social Network    How would you rate your social network (family, work, friends)?: Good participation with social networks    Allergies:  Allergies  Allergen Reactions   Shrimp [Shellfish Allergy] Anaphylaxis and Hives   Hydrocodone  Hives   Penicillins Hives and Other (See Comments)    Has patient had a PCN reaction causing immediate rash, facial/tongue/throat swelling, SOB or lightheadedness with hypotension: No Has patient had a PCN reaction causing severe rash involving mucus membranes or skin necrosis: No Has patient had a PCN reaction that required hospitalization No Has patient had a PCN reaction occurring within the last 10 years: No If all of the above answers are NO, then may proceed with Cephalosporin use.   Pork-Derived Products Other (See Comments)    Reaction:  Increases pts BP     Metabolic Disorder Labs: Lab Results  Component Value Date   HGBA1C 5.9 (H) 03/17/2023   MPG 122.63 03/17/2023   MPG 120 (H) 06/01/2012   No results found for: PROLACTIN No results found for: CHOL, TRIG, HDL, CHOLHDL, VLDL, LDLCALC Lab Results  Component Value Date   TSH 0.745 03/17/2023   TSH 0.667 06/01/2012    Therapeutic Level Labs: No results found for: LITHIUM No results found for: VALPROATE No results found for: CBMZ  Current Medications: Current Outpatient Medications  Medication Sig Dispense Refill   lumateperone  tosylate (CAPLYTA ) 42 MG capsule Take 1 capsule (42 mg total) by mouth daily. 30 capsule 1   albuterol  (VENTOLIN  HFA) 108 (90 Base) MCG/ACT inhaler Inhale 2 puffs into the lungs every 6 (six) hours as needed for shortness of breath.     amLODipine  (NORVASC ) 5 MG tablet Take 5 mg by mouth daily.     aspirin  EC 81 MG tablet Take 1 tablet (81 mg total) by mouth daily. Swallow whole. 30 tablet 12   busPIRone  (BUSPAR ) 15 MG tablet Take 1.5 tablets (22.5 mg total) by mouth 2 (two) times daily. 90 tablet 1   clonazePAM  (KLONOPIN ) 0.5 MG tablet Take 2 tablets (1 mg total) by mouth 2 (two) times daily as needed for anxiety. 60 tablet 0   empagliflozin  (JARDIANCE ) 10 MG TABS tablet Take 1 tablet (10 mg total) by mouth daily before breakfast. 30 tablet 0   EPINEPHrine  (EPIPEN ) 0.3 mg/0.3 mL DEVI Inject 0.3 mLs (0.3 mg total) into the muscle as needed. Use as directed in the event of a severe allergic reaction 1 Device 3   gabapentin  (NEURONTIN ) 300 MG capsule Take 300 mg by mouth 3 (three) times daily.     hydrOXYzine  (VISTARIL ) 25 MG capsule Take 2-4 capsules (50-100 mg total) by mouth 2 (two) times daily as needed for anxiety. 60 capsule 1   labetalol  (NORMODYNE ) 300 MG tablet Take 300 mg by mouth 2 (two) times daily.     lamoTRIgine  (LAMICTAL ) 100 MG tablet Take 1 tablet (100 mg total) by mouth 2 (two) times daily. 60 tablet 1   losartan (COZAAR)  25 MG tablet Take 12.5 mg by mouth 2 (two) times daily.     meclizine (ANTIVERT) 25 MG tablet Take 1 tablet by mouth as needed.     metFORMIN  (GLUCOPHAGE ) 500 MG tablet Take 1 tablet (500 mg  total) by mouth 2 (two) times daily with a meal. (Patient taking differently: Take 0.5 tablets by mouth 2 (two) times daily with a meal.) 60 tablet 0   omeprazole  (PRILOSEC) 20 MG capsule Take 20 mg by mouth 2 (two) times daily.     prochlorperazine  (COMPAZINE ) 5 MG tablet Take 1 tablet (5 mg total) by mouth every 6 (six) hours as needed for nausea or vomiting. 15 tablet 0   rosuvastatin  (CRESTOR ) 5 MG tablet Take 1 tablet (5 mg total) by mouth daily. 30 tablet 3   sertraline  (ZOLOFT ) 50 MG tablet Take 1 tablet (50 mg total) by mouth daily. 30 tablet 1   tiZANidine (ZANAFLEX) 4 MG tablet Take 4 mg by mouth every 6 (six) hours as needed for muscle spasms.     valbenazine  (INGREZZA ) 40 MG capsule Take 1 capsule (40 mg total) by mouth daily. 30 capsule 1   No current facility-administered medications for this visit.     Musculoskeletal: Strength & Muscle Tone: within normal limits Gait & Station: normal Patient leans: N/A  Psychiatric Specialty Exam: Review of Systems  Psychiatric/Behavioral:  Positive for dysphoric mood and sleep disturbance. Negative for decreased concentration, hallucinations, self-injury and suicidal ideas. The patient is nervous/anxious. The patient is not hyperactive.     Blood pressure 117/67, pulse 95, temperature 98 F (36.7 C), temperature source Oral, height 5' 6 (1.676 m), weight 212 lb (96.2 kg), last menstrual period 04/02/2017, SpO2 96%, unknown if currently breastfeeding.Body mass index is 34.22 kg/m.  General Appearance: Casual  Eye Contact:  Good  Speech:  Clear and Coherent and Normal Rate  Volume:  Normal  Mood:  Anxious and Depressed  Affect:  Congruent  Thought Process:  Coherent, Goal Directed, and Descriptions of Associations: Intact  Orientation:  Full  (Time, Place, and Person)  Thought Content: WDL   Suicidal Thoughts:  No  Homicidal Thoughts:  No  Memory:  Immediate;   Good Recent;   Good Remote;   Fair  Judgement:  Good  Insight:  Good  Psychomotor Activity:  Normal  Concentration:  Concentration: Good and Attention Span: Good  Recall:  Good  Fund of Knowledge: Good  Language: Good  Akathisia:  No  Handed:  Right  AIMS (if indicated): done; 6  Assets:  Communication Skills Desire for Improvement Social Support  ADL's:  Intact  Cognition: WNL  Sleep:  Fair   Screenings: AIMS    Flowsheet Row Clinical Support from 03/23/2024 in [facility] Clinical Support from 02/03/2024 in [facility]  AIMS Total Score 6 6   GAD-7    Flowsheet Row Clinical Support from 03/23/2024 in [facility] Clinical Support from 02/03/2024 in [facility] Office Visit from 12/15/2023 in [facility] Clinical Support from 07/10/2023 in [facility] Clinical Support from 02/06/2023 in [facility]  Total GAD-7 Score 15 20 19 16 19    PHQ2-9    Flowsheet Row Clinical Support from 03/23/2024 in [facility] Clinical Support from 02/03/2024 in [facility] Office Visit from 12/15/2023 in [facility] Clinical Support from 07/10/2023 in [facility] Clinical Support from 02/06/2023 in [facility]  PHQ-2 Total Score 6 6 6 6 4   PHQ-9 Total Score 17 23 21 15 17    Flowsheet Row Clinical Support from 03/23/2024 in [facility] ED from 02/14/2024 in [facility] Emergency Department at [facility] ED from 02/13/2024 in [facility] Emergency Department at [facility]  C-SSRS RISK CATEGORY No Risk No Risk No  Risk     Assessment and Plan:   Carla Brooks is a 53 year old, African-American female with a past psychiatric history significant for generalized anxiety disorder, bipolar disorder (depressed, moderate), and PTSD who presents to [facility] for follow up and medication management.  Patient presents to the encounter stating that she was recently taken off for Latuda  prescription due to the medication increasing her blood glucose levels.  She reports that she was recently hospitalized on the last week of June due to an elevated glucose of 589 to 600 g/dL.  Patient reports that she was taken off Latuda  at her metformin  prescription dosage was increased.  Patient is interested in being placed on a different medication for the management of her bipolar disorder (depressed, moderate).  An aims assessment was performed with the patient scoring a 6.  Patient informed provider that she was not approved for the use of Ingrezza  for the management of her involuntary movements caused by possible TD.  Provider to look into other options for patient for the management of her involuntary movements.  Patient verbalized understanding.  Patient continues to exhibit depressive symptoms but denies experiencing presence of mania.  In addition to her depression, patient endorses anxiety that she experiences 2 to 3 days out of the week.  A PHQ-9 screen was performed with the patient scoring a 17.  A GAD-7 screen was also performed with the patient scoring a 15.  For the management of her mood and current depressive symptoms, provider recommended patient be placed on Caplyta  42 mg daily.  Provider discussed side effects associated with the use of Caplyta .  Patient vocalized understanding.  Patient to continue taking all other medications as prescribed.  Patient's medications to be e-prescribed to pharmacy of choice.  The following lab is pending for the patient: Lipid panel.  Patient  to be referred to cardiology for EKG.  Patient vocalized understanding.  A [place] Suicide Severity Rating Scale was performed with the patient being considered no risk.  Patient denies suicidal ideations and is able to contract for safety following the conclusion of the encounter.  Collaboration of Care: Collaboration of Care: Medication Management AEB patient being followed by mental health provider at this facility, Psychiatrist AEB provider managing patient's psychiatric medications, and Referral or follow-up with counselor/therapist AEB patient being seen by a licensed clinical social worker at this facility  Patient/Guardian was advised Release of Information must be obtained prior to any record release in order to collaborate their care with an outside provider. Patient/Guardian was advised if they have not already done so to contact the registration department to sign all necessary forms in order for us  to release information regarding their care.   Consent: Patient/Guardian gives verbal consent for treatment and assignment of benefits for services provided during this visit. Patient/Guardian expressed understanding and agreed to proceed.   1. Bipolar 1 disorder, depressed, moderate (HCC)  - lumateperone  tosylate (CAPLYTA ) 42 MG capsule; Take 1 capsule (42 mg total) by mouth daily.  Dispense: 30 capsule; Refill: 1 - lamoTRIgine  (LAMICTAL ) 100 MG tablet; Take 1 tablet (100 mg total) by mouth 2 (two) times daily.  Dispense: 60 tablet; Refill: 1 - sertraline  (ZOLOFT ) 50 MG tablet; Take 1 tablet (50 mg total) by mouth daily.  Dispense:  30 tablet; Refill: 1  2. GAD (generalized anxiety disorder)  - busPIRone  (BUSPAR ) 15 MG tablet; Take 1.5 tablets (22.5 mg total) by mouth 2 (two) times daily.  Dispense: 90 tablet; Refill: 1 - sertraline  (ZOLOFT ) 50 MG tablet; Take 1 tablet (50 mg total) by mouth daily.  Dispense: 30 tablet; Refill: 1 - hydrOXYzine  (VISTARIL ) 25 MG capsule; Take 2-4 capsules  (50-100 mg total) by mouth 2 (two) times daily as needed for anxiety.  Dispense: 60 capsule; Refill: 1  3. PTSD (post-traumatic stress disorder)  - sertraline  (ZOLOFT ) 50 MG tablet; Take 1 tablet (50 mg total) by mouth daily.  Dispense: 30 tablet; Refill: 1  4. Long term current use of antipsychotic medication (Primary) Referral to Cardiology for EKG  - Lipid panel; Future - Ambulatory referral to Cardiology  5. Tardive dyskinesia Patient not approved for the use of Ingrezza   Patient to follow-up in 6 weeks Provider spent a total of 23 minutes with the patient/reviewing patient's chart  Reginia FORBES Bolster, PA 03/23/2024, 11:00 AM

## 2024-05-05 ENCOUNTER — Ambulatory Visit (HOSPITAL_COMMUNITY): Payer: MEDICAID | Admitting: Physician Assistant

## 2024-05-05 ENCOUNTER — Encounter (HOSPITAL_COMMUNITY): Payer: Self-pay | Admitting: Physician Assistant

## 2024-05-05 VITALS — BP 155/79 | HR 92 | Temp 98.7°F | Ht 66.0 in | Wt 214.0 lb

## 2024-05-05 DIAGNOSIS — F4310 Post-traumatic stress disorder, unspecified: Secondary | ICD-10-CM

## 2024-05-05 DIAGNOSIS — F3132 Bipolar disorder, current episode depressed, moderate: Secondary | ICD-10-CM

## 2024-05-05 DIAGNOSIS — G2401 Drug induced subacute dyskinesia: Secondary | ICD-10-CM | POA: Diagnosis not present

## 2024-05-05 DIAGNOSIS — F411 Generalized anxiety disorder: Secondary | ICD-10-CM

## 2024-05-05 DIAGNOSIS — Z79899 Other long term (current) drug therapy: Secondary | ICD-10-CM

## 2024-05-05 MED ORDER — BUSPIRONE HCL 15 MG PO TABS
22.5000 mg | ORAL_TABLET | Freq: Two times a day (BID) | ORAL | 1 refills | Status: DC
Start: 2024-05-05 — End: 2024-06-09

## 2024-05-05 MED ORDER — HYDROXYZINE PAMOATE 25 MG PO CAPS: 50.0000 mg | ORAL_CAPSULE | Freq: Two times a day (BID) | ORAL | 1 refills | Status: DC | PRN

## 2024-05-05 MED ORDER — LUMATEPERONE TOSYLATE 42 MG PO CAPS: 42.0000 mg | ORAL_CAPSULE | Freq: Every day | ORAL | 1 refills | Status: DC

## 2024-05-05 MED ORDER — LAMOTRIGINE 100 MG PO TABS: 100.0000 mg | ORAL_TABLET | Freq: Two times a day (BID) | ORAL | 1 refills | Status: DC

## 2024-05-05 MED ORDER — ESCITALOPRAM OXALATE 5 MG PO TABS: 5.0000 mg | ORAL_TABLET | Freq: Every day | ORAL | 1 refills | Status: DC

## 2024-05-05 NOTE — Progress Notes (Signed)
 [facility] MD/PA/NP OP Progress Note  05/05/2024 11:00 AM Carla Brooks  MRN:  [id number]  Chief Complaint:  Chief Complaint  Patient presents with   Follow-up   Medication Management   HPI:   [name] is a 53 year old, African-American female with a past psychiatric history significant for generalized anxiety disorder, bipolar disorder (depressed, moderate), and PTSD who presents to [facility] for follow up and medication management.  Patient is currently being managed on the following psychiatric medications  Hydroxyzine  25 mg 2 to 4 capsules by mouth (50 to 100 mg total) 2 times daily Sertraline  50 mg daily Lamotrigine  100 mg 2 times daily Caplyta  42 mg daily Buspirone  22.5 mg 2 times daily  Since the last encounter, patient reports that her depression has worsened.  She reports that she often does not want to do anything or go anywhere.  She reports that she is no longer taking Zoloft  due to experiencing side effects from the medication (vomiting and diarrhea).  Patient rates her depression an 8 out of 10 with 10 being most severe.  Patient endorses depressive episodes every other day with symptoms lasting every other day.  Patient endorses the following depressive symptoms: feelings of sadness, crying spells (however, she is unable to cry), lack of motivation, difficulty concentrating, irritability, feelings of guilt/worthlessness, and hopelessness.  Patient denies any contributing factors to her depression.  Patient reports that her depression is alleviated through sleeping.  In regards to her anxiety, patient reports that her anxiety has not been as bad.  Patient rates her anxiety a 4 or 5 out of 10.  Patient denies any new stressors at this time.  A PHQ-9 screening was performed with the patient scoring a 21.  A GAD-7 screen was also performed with the patient scoring an 18.  Patient is alert and oriented x 4, calm, cooperative, and fully  engaged in conversation during the encounter.  Patient endorses depression and sadness.  Patient exhibits depressed mood with congruent affect.  Patient denies suicidal or homicidal ideations.  She further denies auditory or visual hallucinations and does not appear to be responding to internal/external stimuli.  Patient endorses fair sleep and receives on average 6 to 7 hours of sleep per night.  Patient endorses good appetite and eats on average 3 meals per day.  Patient denies alcohol consumption, tobacco use, or illicit drug use.  Visit Diagnosis:    ICD-10-CM   1. Bipolar 1 disorder, depressed, moderate (HCC)  F31.32 lumateperone  tosylate (CAPLYTA ) 42 MG capsule    lamoTRIgine  (LAMICTAL ) 100 MG tablet    2. GAD (generalized anxiety disorder)  F41.1 escitalopram  (LEXAPRO ) 5 MG tablet    busPIRone  (BUSPAR ) 15 MG tablet    hydrOXYzine  (VISTARIL ) 25 MG capsule      Past Psychiatric History:  Patient endorses a past psychiatric history significant for bipolar disorder (bipolar 1 versus bipolar 2), generalized anxiety disorder, depression, and PTSD   Patient denies a past history of hospitalization due to mental health   Patient denies a past history of suicide attempt   Patient denies a past history of homicide attempt  Past Medical History:  Past Medical History:  Diagnosis Date   Abnormal Pap smear    Anemia 02/21/2012   Arthritis    Asthma    Bronchitis    Chronic hypertension with superimposed preeclampsia 06/01/2012   Diabetes mellitus (HCC) 02/21/2012   Fibroid    GBS (group B streptococcus) UTI complicating pregnancy 06/04/2012  Needs treatment in labor-resistant to Clinda and Erythromycin!     GERD (gastroesophageal reflux disease)    HTN (hypertension) 02/21/2012   Migraines 02/21/2012   Uterine fibroids affecting pregnancy, antepartum 07/27/2012    Past Surgical History:  Procedure Laterality Date   CESAREAN SECTION N/A 10/29/2012   Procedure: CESAREAN SECTION;   Surgeon: Vonn VEAR Inch, MD;  Location: [facility];  Service: Obstetrics;  Laterality: N/A;   CHOLECYSTECTOMY  09/27/2001    Family Psychiatric History:  Uncle - schizophrenia, patient reports that he was taking lithium   Family history of suicide attempts: Patient denies Family history of homicide attempts: Patient denies Family history of substance abuse: Patient reports that her son uses marijuana  Family History:  Family History  Problem Relation Age of Onset   Heart disease Father    Coronary artery disease Father    Cancer Paternal Uncle        throat   Diabetes Maternal Grandmother    Cancer Maternal Grandfather        prostate   Cancer Paternal Grandfather        lung    Social History:  Social History   Socioeconomic History   Marital status: Single    Spouse name: Not on file   Number of children: 3   Years of education: Not on file   Highest education level: Not on file  Occupational History   Not on file  Tobacco Use   Smoking status: Never   Smokeless tobacco: Never  Vaping Use   Vaping status: Never Used  Substance and Sexual Activity   Alcohol use: Not Currently    Comment: occasional   Drug use: No   Sexual activity: Not Currently    Birth control/protection: None  Other Topics Concern   Not on file  Social History Narrative   Not on file   Social Drivers of Health   Financial Resource Strain: Medium Risk (02/16/2024)   Received from [facility]   Overall Financial Resource Strain (CARDIA)    Difficulty of Paying Living Expenses: Somewhat hard  Food Insecurity: Food Insecurity Present (02/16/2024)   Received from [facility]   Hunger Vital Sign    Within the past 12 months, you worried that your food would run out before you got the money to buy more.: Often true    Within the past 12 months, the food you bought just didn't last and you didn't have money to get more.: Sometimes true  Transportation Needs: No Transportation Needs (02/16/2024)    Received from [facility] - Transportation    Lack of Transportation (Medical): No    Lack of Transportation (Non-Medical): No  Physical Activity: Inactive (02/16/2024)   Received from [facility]   Exercise Vital Sign    On average, how many days per week do you engage in moderate to strenuous exercise (like a brisk walk)?: 0 days    On average, how many minutes do you engage in exercise at this level?: 0 min  Stress: Stress Concern Present (02/16/2024)   Received from [facility] - Occupational Stress Questionnaire    Feeling of Stress : Rather much  Social Connections: Socially Integrated (02/16/2024)   Received from [facility]   Social Network    How would you rate your social network (family, work, friends)?: Good participation with social networks    Allergies:  Allergies  Allergen Reactions   Shrimp [Shellfish Allergy] Anaphylaxis  and Hives   Hydrocodone  Hives   Penicillins Hives and Other (See Comments)    Has patient had a PCN reaction causing immediate rash, facial/tongue/throat swelling, SOB or lightheadedness with hypotension: No Has patient had a PCN reaction causing severe rash involving mucus membranes or skin necrosis: No Has patient had a PCN reaction that required hospitalization No Has patient had a PCN reaction occurring within the last 10 years: No If all of the above answers are NO, then may proceed with Cephalosporin use.   Pork-Derived Products Other (See Comments)    Reaction:  Increases pts BP    Metabolic Disorder Labs: Lab Results  Component Value Date   HGBA1C 5.9 (H) 03/17/2023   MPG 122.63 03/17/2023   MPG 120 (H) 06/01/2012   No results found for: PROLACTIN No results found for: CHOL, TRIG, HDL, CHOLHDL, VLDL, LDLCALC Lab Results  Component Value Date   TSH 0.745 03/17/2023   TSH 0.667 06/01/2012    Therapeutic Level Labs: No results found for: LITHIUM No  results found for: VALPROATE No results found for: CBMZ  Current Medications: Current Outpatient Medications  Medication Sig Dispense Refill   escitalopram  (LEXAPRO ) 5 MG tablet Take 1 tablet (5 mg total) by mouth daily. 30 tablet 1   albuterol  (VENTOLIN  HFA) 108 (90 Base) MCG/ACT inhaler Inhale 2 puffs into the lungs every 6 (six) hours as needed for shortness of breath.     amLODipine  (NORVASC ) 5 MG tablet Take 5 mg by mouth daily.     aspirin  EC 81 MG tablet Take 1 tablet (81 mg total) by mouth daily. Swallow whole. 30 tablet 12   busPIRone  (BUSPAR ) 15 MG tablet Take 1.5 tablets (22.5 mg total) by mouth 2 (two) times daily. 90 tablet 1   clonazePAM  (KLONOPIN ) 0.5 MG tablet Take 2 tablets (1 mg total) by mouth 2 (two) times daily as needed for anxiety. 60 tablet 0   empagliflozin  (JARDIANCE ) 10 MG TABS tablet Take 1 tablet (10 mg total) by mouth daily before breakfast. 30 tablet 0   EPINEPHrine  (EPIPEN ) 0.3 mg/0.3 mL DEVI Inject 0.3 mLs (0.3 mg total) into the muscle as needed. Use as directed in the event of a severe allergic reaction 1 Device 3   gabapentin  (NEURONTIN ) 300 MG capsule Take 300 mg by mouth 3 (three) times daily.     hydrOXYzine  (VISTARIL ) 25 MG capsule Take 2-4 capsules (50-100 mg total) by mouth 2 (two) times daily as needed for anxiety. 60 capsule 1   labetalol  (NORMODYNE ) 300 MG tablet Take 300 mg by mouth 2 (two) times daily.     lamoTRIgine  (LAMICTAL ) 100 MG tablet Take 1 tablet (100 mg total) by mouth 2 (two) times daily. 60 tablet 1   losartan (COZAAR) 25 MG tablet Take 12.5 mg by mouth 2 (two) times daily.     lumateperone  tosylate (CAPLYTA ) 42 MG capsule Take 1 capsule (42 mg total) by mouth daily. 30 capsule 1   meclizine (ANTIVERT) 25 MG tablet Take 1 tablet by mouth as needed.     metFORMIN  (GLUCOPHAGE ) 500 MG tablet Take 1 tablet (500 mg total) by mouth 2 (two) times daily with a meal. (Patient taking differently: Take 0.5 tablets by mouth 2 (two) times daily  with a meal.) 60 tablet 0   omeprazole  (PRILOSEC) 20 MG capsule Take 20 mg by mouth 2 (two) times daily.     prochlorperazine  (COMPAZINE ) 5 MG tablet Take 1 tablet (5 mg total) by mouth every 6 (six) hours as  needed for nausea or vomiting. 15 tablet 0   rosuvastatin  (CRESTOR ) 5 MG tablet Take 1 tablet (5 mg total) by mouth daily. 30 tablet 3   tiZANidine (ZANAFLEX) 4 MG tablet Take 4 mg by mouth every 6 (six) hours as needed for muscle spasms.     valbenazine  (INGREZZA ) 40 MG capsule Take 1 capsule (40 mg total) by mouth daily. 30 capsule 1   No current facility-administered medications for this visit.     Musculoskeletal: Strength & Muscle Tone: within normal limits Gait & Station: normal Patient leans: N/A  Psychiatric Specialty Exam: Review of Systems  Psychiatric/Behavioral:  Positive for dysphoric mood and sleep disturbance. Negative for decreased concentration, hallucinations, self-injury and suicidal ideas. The patient is nervous/anxious. The patient is not hyperactive.     Blood pressure (!) 155/79, pulse 92, temperature 98.7 F (37.1 C), temperature source Oral, height 5' 6 (1.676 m), weight 214 lb (97.1 kg), last menstrual period 04/02/2017, SpO2 100%, unknown if currently breastfeeding.Body mass index is 34.54 kg/m.  General Appearance: Casual  Eye Contact:  Good  Speech:  Clear and Coherent and Normal Rate  Volume:  Normal  Mood:  Anxious and Depressed  Affect:  Congruent  Thought Process:  Coherent, Goal Directed, and Descriptions of Associations: Intact  Orientation:  Full (Time, Place, and Person)  Thought Content: WDL   Suicidal Thoughts:  No  Homicidal Thoughts:  No  Memory:  Immediate;   Good Recent;   Good Remote;   Fair  Judgement:  Good  Insight:  Good  Psychomotor Activity:  Normal  Concentration:  Concentration: Good and Attention Span: Good  Recall:  Good  Fund of Knowledge: Good  Language: Good  Akathisia:  No  Handed:  Right  AIMS (if  indicated): done; 6  Assets:  Communication Skills Desire for Improvement Social Support  ADL's:  Intact  Cognition: WNL  Sleep:  Fair   Screenings: AIMS    Flowsheet Row Clinical Support from 05/05/2024 in [facility] Clinical Support from 03/23/2024 in [facility] Clinical Support from 02/03/2024 in [place]  AIMS Total Score 4 6 6    GAD-7    Flowsheet Row Clinical Support from 05/05/2024 in [facility] Clinical Support from 03/23/2024 in [facility] Clinical Support from 02/03/2024 in [facility] Office Visit from 12/15/2023 in [facility] Clinical Support from 07/10/2023 in [facility]  Total GAD-7 Score 18 15 20 19 16    PHQ2-9    Flowsheet Row Clinical Support from 05/05/2024 in [facility] Clinical Support from 03/23/2024 in [facility] Clinical Support from 02/03/2024 in [facility] Office Visit from 12/15/2023 in [facility] Clinical Support from 07/10/2023 in [facility]  PHQ-2 Total Score 6 6 6 6 6   PHQ-9 Total Score 21 17 23 21 15    Flowsheet Row Clinical Support from 05/05/2024 in [facility] Clinical Support from 03/23/2024 in [facility] ED from 02/14/2024 in [facility] Emergency Department at [facility]  C-SSRS RISK CATEGORY No Risk No Risk No Risk     Assessment and Plan:   Carla Brooks is a 53 year old, African-American female with a past psychiatric history significant for generalized anxiety disorder, bipolar disorder (depressed, moderate), and PTSD who presents to [facility] for follow up and medication  management.  Patient presents to the encounter stating that she discontinued her use of her Zoloft  due to experiencing diarrhea and vomiting from use of the medication.  She reports that she continues taking her other medications as prescribed.  An aims assessment was performed with the patient scoring a 4.  Despite scoring a 4 on her aims assessment, patient denies the need for medication management for her involuntary movements at this time.  Patient reports that she has been experiencing worsening depression characterized by lack of motivation and not wanting to go anywhere.  Patient denies any major contributing factors to her depression.  She reports that her anxiety has not been as bad; however, this has been an issue as well.  A PHQ-9 screen was performed with the patient scoring a 21.  A GAD-7 screen was also performed with the patient scoring an 18.  Provider recommended placing patient on escitalopram  5 mg daily for the management of her depressive symptoms and anxiety.  Patient is currently also on Caplyta  42 mg daily for mood stability.  Provider discussed side effects associated with the use of escitalopram  prior to the conclusion of the encounter.  Patient vocalized understanding.  Patient to continue taking all of her other medications as prescribed.  Patient medications to be e-prescribed to pharmacy of choice.  A [place] Suicide Severity Rating Scale was performed with the patient being considered no risk.  Patient denies suicidal ideations and is able to contract for safety at this time.    Collaboration of Care: Collaboration of Care: Medication Management AEB patient being followed by mental health provider at this facility, Psychiatrist AEB provider managing patient's psychiatric medications, and Referral or follow-up with counselor/therapist AEB patient being seen by a licensed clinical social worker at this facility  Patient/Guardian was advised Release of Information must be  obtained prior to any record release in order to collaborate their care with an outside provider. Patient/Guardian was advised if they have not already done so to contact the registration department to sign all necessary forms in order for us  to release information regarding their care.   Consent: Patient/Guardian gives verbal consent for treatment and assignment of benefits for services provided during this visit. Patient/Guardian expressed understanding and agreed to proceed.   1. Bipolar 1 disorder, depressed, moderate (HCC)  - lumateperone  tosylate (CAPLYTA ) 42 MG capsule; Take 1 capsule (42 mg total) by mouth daily.  Dispense: 30 capsule; Refill: 1 - lamoTRIgine  (LAMICTAL ) 100 MG tablet; Take 1 tablet (100 mg total) by mouth 2 (two) times daily.  Dispense: 60 tablet; Refill: 1  2. GAD (generalized anxiety disorder)  - escitalopram  (LEXAPRO ) 5 MG tablet; Take 1 tablet (5 mg total) by mouth daily.  Dispense: 30 tablet; Refill: 1 - busPIRone  (BUSPAR ) 15 MG tablet; Take 1.5 tablets (22.5 mg total) by mouth 2 (two) times daily.  Dispense: 90 tablet; Refill: 1 - hydrOXYzine  (VISTARIL ) 25 MG capsule; Take 2-4 capsules (50-100 mg total) by mouth 2 (two) times daily as needed for anxiety.  Dispense: 60 capsule; Refill: 1  3. Long term current use of antipsychotic medication (Primary) Referral to Cardiology for EKG  Patient has pending labs  4. Tardive dyskinesia Patient not approved for the use of Ingrezza   5. PTSD (post-traumatic stress disorder)  - escitalopram  (LEXAPRO ) 5 MG tablet; Take 1 tablet (5 mg total) by mouth daily.  Dispense: 30 tablet; Refill: 1  Patient to follow-up in 5 weeks Provider spent  a total of 24 minutes with the patient/reviewing patient's chart  Reginia FORBES Bolster, PA 05/05/2024, 11:00 AM

## 2024-05-24 ENCOUNTER — Other Ambulatory Visit: Payer: Self-pay | Admitting: Cardiology

## 2024-05-24 DIAGNOSIS — R072 Precordial pain: Secondary | ICD-10-CM

## 2024-06-09 ENCOUNTER — Encounter (HOSPITAL_COMMUNITY): Payer: Self-pay | Admitting: Physician Assistant

## 2024-06-09 ENCOUNTER — Ambulatory Visit (INDEPENDENT_AMBULATORY_CARE_PROVIDER_SITE_OTHER): Payer: MEDICAID | Admitting: Physician Assistant

## 2024-06-09 VITALS — BP 125/80 | HR 89 | Temp 98.1°F | Ht 66.0 in | Wt 206.2 lb

## 2024-06-09 DIAGNOSIS — Z79899 Other long term (current) drug therapy: Secondary | ICD-10-CM

## 2024-06-09 DIAGNOSIS — F3132 Bipolar disorder, current episode depressed, moderate: Secondary | ICD-10-CM

## 2024-06-09 DIAGNOSIS — G2401 Drug induced subacute dyskinesia: Secondary | ICD-10-CM

## 2024-06-09 DIAGNOSIS — F4310 Post-traumatic stress disorder, unspecified: Secondary | ICD-10-CM | POA: Diagnosis not present

## 2024-06-09 DIAGNOSIS — F411 Generalized anxiety disorder: Secondary | ICD-10-CM | POA: Diagnosis not present

## 2024-06-09 MED ORDER — ESCITALOPRAM OXALATE 5 MG PO TABS: 5.0000 mg | ORAL_TABLET | Freq: Every day | ORAL | 1 refills | Status: DC

## 2024-06-09 MED ORDER — LUMATEPERONE TOSYLATE 42 MG PO CAPS: 42.0000 mg | ORAL_CAPSULE | Freq: Every day | ORAL | 1 refills | Status: DC

## 2024-06-09 MED ORDER — BUSPIRONE HCL 30 MG PO TABS: 30.0000 mg | ORAL_TABLET | Freq: Two times a day (BID) | ORAL | 1 refills | Status: DC

## 2024-06-09 MED ORDER — LAMOTRIGINE 100 MG PO TABS: 100.0000 mg | ORAL_TABLET | Freq: Two times a day (BID) | ORAL | 1 refills | Status: DC

## 2024-06-09 MED ORDER — HYDROXYZINE PAMOATE 25 MG PO CAPS: 50.0000 mg | ORAL_CAPSULE | Freq: Two times a day (BID) | ORAL | 1 refills | Status: DC | PRN

## 2024-06-09 NOTE — Progress Notes (Addendum)
 [facility] MD/PA/NP OP Progress Note  06/09/2024 11:00 AM Carla Brooks  MRN:  [id number]  Chief Complaint:  Chief Complaint  Patient presents with   Follow-up   Medication Management   HPI:   Carla Brooks is a 53 year old, African-American female with a past psychiatric history significant for generalized anxiety disorder, bipolar disorder (depressed, moderate), and PTSD who presents to [facility] for follow up and medication management.  Patient is currently being managed on the following psychiatric medications  Hydroxyzine  25 mg 2 to 4 capsules by mouth (50 to 100 mg total) 2 times daily Escitalopram  5 mg daily Lamotrigine  100 mg 2 times daily Caplyta  42 mg daily Buspirone  22.5 mg 2 times daily  Patient presents to the encounter stating that she continues to take her medications regularly.  She reports that she was not given a prescription for escitalopram  when she went to pick up her prescriptions.  She reports that her anxiety remains elevated and constant throughout the day.  She reports that her anxiety is accompanied by picking at her fingernails down to the base.  She reports that her anxiety is alleviated through cleaning.  She also continues to endorse depression but states that her symptoms have improved since last encounter.  Patient rates her depression a 5-6 out of 10 with 10 being most severe.  Patient endorses depressive episodes 4 days out of the week.  Patient endorses the following depressive symptoms: sadness, crying spells, lack of motivation (worsened), decreased concentration, decreased energy, irritability, feelings of guilt/worthlessness, and hopelessness.  A PHQ-9 screen was performed with the patient scoring a 16.  A GAD-7 screen was also performed with the patient scoring a 20.  Patient is alert and oriented x 4, calm, cooperative, and fully engaged in conversation during the encounter.  Patient endorses okay mood but  states that her anxiety is elevated.  Patient exhibits depressed and anxious mood with congruent affect.  Patient denies suicidal or homicidal ideations.  She further denies auditory or visual hallucinations and does not appear to be responding to internal/external stimuli.  Patient endorses fair sleep and receives on average 6 hours of sleep per night.  Patient endorses good appetite and eats on average 2 meals per day.  Patient denies alcohol, tobacco use, or illicit drug use.  Visit Diagnosis:    ICD-10-CM   1. Bipolar 1 disorder, depressed, moderate (HCC)  F31.32 lamoTRIgine  (LAMICTAL ) 100 MG tablet    lumateperone  tosylate (CAPLYTA ) 42 MG capsule    2. GAD (generalized anxiety disorder)  F41.1 hydrOXYzine  (VISTARIL ) 25 MG capsule    escitalopram  (LEXAPRO ) 5 MG tablet    busPIRone  (BUSPAR ) 30 MG tablet    3. PTSD (post-traumatic stress disorder)  F43.10 escitalopram  (LEXAPRO ) 5 MG tablet      Past Psychiatric History:  Patient endorses a past psychiatric history significant for bipolar disorder (bipolar 1 versus bipolar 2), generalized anxiety disorder, depression, and PTSD   Patient denies a past history of hospitalization due to mental health   Patient denies a past history of suicide attempt   Patient denies a past history of homicide attempt  Past Medical History:  Past Medical History:  Diagnosis Date   Abnormal Pap smear    Anemia 02/21/2012   Arthritis    Asthma    Bronchitis    Chronic hypertension with superimposed preeclampsia 06/01/2012   Diabetes mellitus (HCC) 02/21/2012   Fibroid    GBS (group B streptococcus) UTI complicating pregnancy 06/04/2012  Needs treatment in labor-resistant to Clinda and Erythromycin!     GERD (gastroesophageal reflux disease)    HTN (hypertension) 02/21/2012   Migraines 02/21/2012   Uterine fibroids affecting pregnancy, antepartum 07/27/2012    Past Surgical History:  Procedure Laterality Date   CESAREAN SECTION N/A 10/29/2012    Procedure: CESAREAN SECTION;  Surgeon: Vonn VEAR Inch, MD;  Location: [facility];  Service: Obstetrics;  Laterality: N/A;   CHOLECYSTECTOMY  09/27/2001    Family Psychiatric History:  Uncle - schizophrenia, patient reports that he was taking lithium   Family history of suicide attempts: Patient denies Family history of homicide attempts: Patient denies Family history of substance abuse: Patient reports that her son uses marijuana  Family History:  Family History  Problem Relation Age of Onset   Heart disease Father    Coronary artery disease Father    Cancer Paternal Uncle        throat   Diabetes Maternal Grandmother    Cancer Maternal Grandfather        prostate   Cancer Paternal Grandfather        lung    Social History:  Social History   Socioeconomic History   Marital status: Single    Spouse name: Not on file   Number of children: 3   Years of education: Not on file   Highest education level: Not on file  Occupational History   Not on file  Tobacco Use   Smoking status: Never   Smokeless tobacco: Never  Vaping Use   Vaping status: Never Used  Substance and Sexual Activity   Alcohol use: Not Currently    Comment: occasional   Drug use: No   Sexual activity: Not Currently    Birth control/protection: None  Other Topics Concern   Not on file  Social History Narrative   Not on file   Social Drivers of Health   Financial Resource Strain: Medium Risk (02/16/2024)   Received from [facility]   Overall Financial Resource Strain (CARDIA)    Difficulty of Paying Living Expenses: Somewhat hard  Food Insecurity: Food Insecurity Present (02/16/2024)   Received from [facility]   Hunger Vital Sign    Within the past 12 months, you worried that your food would run out before you got the money to buy more.: Often true    Within the past 12 months, the food you bought just didn't last and you didn't have money to get more.: Sometimes true  Transportation Needs: No  Transportation Needs (02/16/2024)   Received from [facility] - Transportation    Lack of Transportation (Medical): No    Lack of Transportation (Non-Medical): No  Physical Activity: Inactive (02/16/2024)   Received from [facility]   Exercise Vital Sign    On average, how many days per week do you engage in moderate to strenuous exercise (like a brisk walk)?: 0 days    On average, how many minutes do you engage in exercise at this level?: 0 min  Stress: Stress Concern Present (02/16/2024)   Received from [facility] - Occupational Stress Questionnaire    Feeling of Stress : Rather much  Social Connections: Socially Integrated (02/16/2024)   Received from [facility]   Social Network    How would you rate your social network (family, work, friends)?: Good participation with social networks    Allergies:  Allergies  Allergen Reactions   Shrimp [Shellfish Allergy] Anaphylaxis  and Hives   Hydrocodone  Hives   Penicillins Hives and Other (See Comments)    Has patient had a PCN reaction causing immediate rash, facial/tongue/throat swelling, SOB or lightheadedness with hypotension: No Has patient had a PCN reaction causing severe rash involving mucus membranes or skin necrosis: No Has patient had a PCN reaction that required hospitalization No Has patient had a PCN reaction occurring within the last 10 years: No If all of the above answers are NO, then may proceed with Cephalosporin use.   Pork-Derived Products Other (See Comments)    Reaction:  Increases pts BP    Metabolic Disorder Labs: Lab Results  Component Value Date   HGBA1C 5.9 (H) 03/17/2023   MPG 122.63 03/17/2023   MPG 120 (H) 06/01/2012   No results found for: PROLACTIN No results found for: CHOL, TRIG, HDL, CHOLHDL, VLDL, LDLCALC Lab Results  Component Value Date   TSH 0.745 03/17/2023   TSH 0.667 06/01/2012    Therapeutic Level Labs: No  results found for: LITHIUM No results found for: VALPROATE No results found for: CBMZ  Current Medications: Current Outpatient Medications  Medication Sig Dispense Refill   albuterol  (VENTOLIN  HFA) 108 (90 Base) MCG/ACT inhaler Inhale 2 puffs into the lungs every 6 (six) hours as needed for shortness of breath.     amLODipine  (NORVASC ) 5 MG tablet Take 5 mg by mouth daily.     aspirin  EC (ASPIRIN  LOW DOSE) 81 MG tablet TAKE 1 TABLET BY MOUTH ONCE DAILY SWALLOW  WHOLE 30 tablet 1   busPIRone  (BUSPAR ) 30 MG tablet Take 1 tablet (30 mg total) by mouth 2 (two) times daily. 60 tablet 1   clonazePAM  (KLONOPIN ) 0.5 MG tablet Take 2 tablets (1 mg total) by mouth 2 (two) times daily as needed for anxiety. 60 tablet 0   empagliflozin  (JARDIANCE ) 10 MG TABS tablet Take 1 tablet (10 mg total) by mouth daily before breakfast. 30 tablet 0   EPINEPHrine  (EPIPEN ) 0.3 mg/0.3 mL DEVI Inject 0.3 mLs (0.3 mg total) into the muscle as needed. Use as directed in the event of a severe allergic reaction 1 Device 3   escitalopram  (LEXAPRO ) 5 MG tablet Take 1 tablet (5 mg total) by mouth daily. 30 tablet 1   gabapentin  (NEURONTIN ) 300 MG capsule Take 300 mg by mouth 3 (three) times daily.     hydrOXYzine  (VISTARIL ) 25 MG capsule Take 2-4 capsules (50-100 mg total) by mouth 2 (two) times daily as needed for anxiety. 60 capsule 1   labetalol  (NORMODYNE ) 300 MG tablet Take 300 mg by mouth 2 (two) times daily.     lamoTRIgine  (LAMICTAL ) 100 MG tablet Take 1 tablet (100 mg total) by mouth 2 (two) times daily. 60 tablet 1   losartan (COZAAR) 25 MG tablet Take 12.5 mg by mouth 2 (two) times daily.     lumateperone  tosylate (CAPLYTA ) 42 MG capsule Take 1 capsule (42 mg total) by mouth daily. 30 capsule 1   meclizine (ANTIVERT) 25 MG tablet Take 1 tablet by mouth as needed.     metFORMIN  (GLUCOPHAGE ) 500 MG tablet Take 1 tablet (500 mg total) by mouth 2 (two) times daily with a meal. (Patient taking differently: Take 0.5  tablets by mouth 2 (two) times daily with a meal.) 60 tablet 0   omeprazole  (PRILOSEC) 20 MG capsule Take 20 mg by mouth 2 (two) times daily.     prochlorperazine  (COMPAZINE ) 5 MG tablet Take 1 tablet (5 mg total) by mouth every 6 (six)  hours as needed for nausea or vomiting. 15 tablet 0   rosuvastatin  (CRESTOR ) 5 MG tablet Take 1 tablet (5 mg total) by mouth daily. 30 tablet 3   tiZANidine (ZANAFLEX) 4 MG tablet Take 4 mg by mouth every 6 (six) hours as needed for muscle spasms.     valbenazine  (INGREZZA ) 40 MG capsule Take 1 capsule (40 mg total) by mouth daily. 30 capsule 1   No current facility-administered medications for this visit.     Musculoskeletal: Strength & Muscle Tone: within normal limits Gait & Station: normal Patient leans: N/A  Psychiatric Specialty Exam: Review of Systems  Psychiatric/Behavioral:  Positive for dysphoric mood and sleep disturbance. Negative for decreased concentration, hallucinations, self-injury and suicidal ideas. The patient is nervous/anxious. The patient is not hyperactive.     Blood pressure 125/80, pulse 89, temperature 98.1 F (36.7 C), temperature source Oral, height 5' 6 (1.676 m), weight 206 lb 3.2 oz (93.5 kg), last menstrual period 04/02/2017, SpO2 98%, unknown if currently breastfeeding.Body mass index is 33.28 kg/m.  General Appearance: Casual  Eye Contact:  Good  Speech:  Clear and Coherent and Normal Rate  Volume:  Normal  Mood:  Anxious and Depressed  Affect:  Congruent  Thought Process:  Coherent, Goal Directed, and Descriptions of Associations: Intact  Orientation:  Full (Time, Place, and Person)  Thought Content: WDL   Suicidal Thoughts:  No  Homicidal Thoughts:  No  Memory:  Immediate;   Good Recent;   Good Remote;   Fair  Judgement:  Good  Insight:  Good  Psychomotor Activity:  Normal  Concentration:  Concentration: Good and Attention Span: Good  Recall:  Good  Fund of Knowledge: Good  Language: Good  Akathisia:   No  Handed:  Right  AIMS (if indicated): done; 6  Assets:  Communication Skills Desire for Improvement Social Support  ADL's:  Intact  Cognition: WNL  Sleep:  Fair   Screenings: AIMS    Flowsheet Row Clinical Support from 06/09/2024 in [facility] Clinical Support from 05/05/2024 in [facility] Clinical Support from 03/23/2024 in [facility] Clinical Support from 02/03/2024 in [facility]  AIMS Total Score 2 4 6 6    GAD-7    Flowsheet Row Clinical Support from 06/09/2024 in [facility] Clinical Support from 05/05/2024 in [facility] Clinical Support from 03/23/2024 in [facility] Clinical Support from 02/03/2024 in [facility] Office Visit from 12/15/2023 in [facility]  Total GAD-7 Score 20 18 15 20 19    PHQ2-9    Flowsheet Row Clinical Support from 06/09/2024 in [facility] Clinical Support from 05/05/2024 in [facility] Clinical Support from 03/23/2024 in [facility] Clinical Support from 02/03/2024 in [facility] Office Visit from 12/15/2023 in [facility]  PHQ-2 Total Score 5 6 6 6 6   PHQ-9 Total Score 16 21 17 23 21    Flowsheet Row Clinical Support from 06/09/2024 in [facility] Clinical Support from 05/05/2024 in [facility] Clinical Support from 03/23/2024 in [facility]  C-SSRS RISK CATEGORY No Risk No Risk No Risk     Assessment and Plan:   Carla Brooks is a 53 year old, African-American female with a past psychiatric history significant for generalized anxiety disorder,  bipolar disorder (depressed, moderate),  and PTSD who presents to [facility] for follow up and medication management.  Patient presents to the encounter stating that she has been taking her medications regularly and denies experiencing any adverse side effects.  An aims assessment was performed and the patient scoring a 2.  Patient denies any need for medication management for her involuntary movements.  During her last encounter, patient reports that she was never given a prescription for escitalopram .  She still continues to endorse elevated anxiety stating that it is constantly elevated throughout the day.  She also endorses depressive symptoms.  A PHQ-9 screen was performed with the patient scoring a 16.  A GAD-7 screen was also performed with the patient scoring a 20.  Provider encouraged patient to start escitalopram  5 mg daily for the management of her depressive symptoms and anxiety.  Provider also recommended increasing patient's buspirone  dosage from 22.5 mg to 30 mg 2 times daily for the management of her anxiety.  Patient was agreeable to recommendations.  Patient to continue taking all other medications as prescribed.  Patient's medications to be e-prescribed to pharmacy of choice.  Patient was reminded that she has pending labs due to her use of an antipsychotic (Caplyta ).  Patient was also informed that her EKG needs to be updated.  Patient vocalized understanding.  A [place] Suicide Severity Rating Scale was performed with the patient being considered no risk.  Patient denies suicidal ideations and is able to contract for safety at this time.  Collaboration of Care: Collaboration of Care: Medication Management AEB patient being followed by mental health provider at this facility, Psychiatrist AEB provider managing patient's psychiatric medications, and Referral or follow-up with counselor/therapist AEB patient being seen by a licensed clinical social worker at this  facility  Patient/Guardian was advised Release of Information must be obtained prior to any record release in order to collaborate their care with an outside provider. Patient/Guardian was advised if they have not already done so to contact the registration department to sign all necessary forms in order for us  to release information regarding their care.   Consent: Patient/Guardian gives verbal consent for treatment and assignment of benefits for services provided during this visit. Patient/Guardian expressed understanding and agreed to proceed.   1. Bipolar 1 disorder, depressed, moderate (HCC)  - lamoTRIgine  (LAMICTAL ) 100 MG tablet; Take 1 tablet (100 mg total) by mouth 2 (two) times daily.  Dispense: 60 tablet; Refill: 1 - lumateperone  tosylate (CAPLYTA ) 42 MG capsule; Take 1 capsule (42 mg total) by mouth daily.  Dispense: 30 capsule; Refill: 1  2. GAD (generalized anxiety disorder)  - hydrOXYzine  (VISTARIL ) 25 MG capsule; Take 2-4 capsules (50-100 mg total) by mouth 2 (two) times daily as needed for anxiety.  Dispense: 60 capsule; Refill: 1 - escitalopram  (LEXAPRO ) 5 MG tablet; Take 1 tablet (5 mg total) by mouth daily.  Dispense: 30 tablet; Refill: 1 - busPIRone  (BUSPAR ) 30 MG tablet; Take 1 tablet (30 mg total) by mouth 2 (two) times daily.  Dispense: 60 tablet; Refill: 1  3. PTSD (post-traumatic stress disorder)  - escitalopram  (LEXAPRO ) 5 MG tablet; Take 1 tablet (5 mg total) by mouth daily.  Dispense: 30 tablet; Refill: 1  4. Tardive dyskinesia (Primary) Patient not approved for the use of Ingrezza   5. Long term current use of antipsychotic medication Referral to Cardiology for EKG  Patient has pending labs  Patient to follow-up in 7 weeks Provider spent a total of  17 minutes with the patient/reviewing patient's chart  Reginia FORBES Bolster, PA 06/09/2024, 11:00 AM

## 2024-07-23 ENCOUNTER — Other Ambulatory Visit: Payer: Self-pay | Admitting: Cardiology

## 2024-07-23 DIAGNOSIS — R072 Precordial pain: Secondary | ICD-10-CM

## 2024-07-28 ENCOUNTER — Encounter (HOSPITAL_COMMUNITY): Payer: MEDICAID | Admitting: Physician Assistant

## 2024-08-08 ENCOUNTER — Other Ambulatory Visit: Payer: Self-pay | Admitting: Cardiology

## 2024-08-08 DIAGNOSIS — R072 Precordial pain: Secondary | ICD-10-CM

## 2024-08-12 ENCOUNTER — Other Ambulatory Visit (HOSPITAL_COMMUNITY): Payer: Self-pay | Admitting: Physician Assistant

## 2024-08-12 DIAGNOSIS — F411 Generalized anxiety disorder: Secondary | ICD-10-CM

## 2024-08-12 DIAGNOSIS — F4310 Post-traumatic stress disorder, unspecified: Secondary | ICD-10-CM

## 2024-08-12 DIAGNOSIS — F3132 Bipolar disorder, current episode depressed, moderate: Secondary | ICD-10-CM

## 2024-08-16 ENCOUNTER — Other Ambulatory Visit (HOSPITAL_COMMUNITY): Payer: Self-pay | Admitting: Physician Assistant

## 2024-08-16 DIAGNOSIS — F411 Generalized anxiety disorder: Secondary | ICD-10-CM

## 2024-08-16 DIAGNOSIS — F3132 Bipolar disorder, current episode depressed, moderate: Secondary | ICD-10-CM

## 2024-08-16 DIAGNOSIS — F4310 Post-traumatic stress disorder, unspecified: Secondary | ICD-10-CM

## 2024-08-19 ENCOUNTER — Other Ambulatory Visit (HOSPITAL_COMMUNITY): Payer: Self-pay | Admitting: Physician Assistant

## 2024-08-19 DIAGNOSIS — F4310 Post-traumatic stress disorder, unspecified: Secondary | ICD-10-CM

## 2024-08-19 DIAGNOSIS — F411 Generalized anxiety disorder: Secondary | ICD-10-CM

## 2024-09-03 ENCOUNTER — Encounter (HOSPITAL_COMMUNITY): Payer: Self-pay

## 2024-09-03 ENCOUNTER — Other Ambulatory Visit (HOSPITAL_COMMUNITY): Payer: Self-pay | Admitting: Physician Assistant

## 2024-09-03 ENCOUNTER — Encounter (HOSPITAL_COMMUNITY): Payer: MEDICAID | Admitting: Physician Assistant

## 2024-09-03 ENCOUNTER — Telehealth (HOSPITAL_COMMUNITY): Payer: Self-pay | Admitting: Physician Assistant

## 2024-09-03 DIAGNOSIS — F411 Generalized anxiety disorder: Secondary | ICD-10-CM

## 2024-09-03 DIAGNOSIS — F3132 Bipolar disorder, current episode depressed, moderate: Secondary | ICD-10-CM

## 2024-09-03 DIAGNOSIS — F4310 Post-traumatic stress disorder, unspecified: Secondary | ICD-10-CM

## 2024-09-03 NOTE — Telephone Encounter (Signed)
 Patient called to say she could not make her appointment because she was in [place] . She wanted to know if she would have to do a walk in appointment due to no shows. Patient has one no show within the year, so this would be the second.

## 2024-09-13 ENCOUNTER — Other Ambulatory Visit (HOSPITAL_COMMUNITY): Payer: Self-pay | Admitting: Physician Assistant

## 2024-09-13 DIAGNOSIS — F3132 Bipolar disorder, current episode depressed, moderate: Secondary | ICD-10-CM

## 2024-09-23 ENCOUNTER — Encounter (HOSPITAL_COMMUNITY): Payer: Self-pay | Admitting: Physician Assistant

## 2024-09-23 ENCOUNTER — Ambulatory Visit (INDEPENDENT_AMBULATORY_CARE_PROVIDER_SITE_OTHER): Payer: MEDICAID | Admitting: Physician Assistant

## 2024-09-23 VITALS — BP 153/81 | HR 97 | Ht 66.0 in | Wt 236.6 lb

## 2024-09-23 DIAGNOSIS — F3132 Bipolar disorder, current episode depressed, moderate: Secondary | ICD-10-CM | POA: Diagnosis not present

## 2024-09-23 DIAGNOSIS — F411 Generalized anxiety disorder: Secondary | ICD-10-CM | POA: Diagnosis not present

## 2024-09-23 DIAGNOSIS — F4310 Post-traumatic stress disorder, unspecified: Secondary | ICD-10-CM | POA: Diagnosis not present

## 2024-09-23 DIAGNOSIS — Z79899 Other long term (current) drug therapy: Secondary | ICD-10-CM

## 2024-09-23 MED ORDER — ESCITALOPRAM OXALATE 10 MG PO TABS: 10.0000 mg | ORAL_TABLET | Freq: Every day | ORAL | 1 refills | Status: AC

## 2024-09-23 MED ORDER — BUSPIRONE HCL 30 MG PO TABS: 30.0000 mg | ORAL_TABLET | Freq: Two times a day (BID) | ORAL | 1 refills | Status: AC

## 2024-09-23 MED ORDER — LUMATEPERONE TOSYLATE 42 MG PO CAPS: 42.0000 mg | ORAL_CAPSULE | Freq: Every day | ORAL | 0 refills | Status: AC

## 2024-09-23 MED ORDER — HYDROXYZINE PAMOATE 25 MG PO CAPS: 50.0000 mg | ORAL_CAPSULE | Freq: Two times a day (BID) | ORAL | 1 refills | Status: AC | PRN

## 2024-09-23 MED ORDER — LAMOTRIGINE 100 MG PO TABS: 100.0000 mg | ORAL_TABLET | Freq: Two times a day (BID) | ORAL | 1 refills | Status: AC

## 2024-09-23 NOTE — Progress Notes (Cosign Needed)
 [facility] MD/PA/NP OP Progress Note  09/23/2024 11:30 AM Carla Brooks  MRN:  [id number]  Chief Complaint:  Chief Complaint  Patient presents with   Follow-up   Medication Management   HPI:   Carla Brooks is a 54 year old, African-American female with a past psychiatric history significant for generalized anxiety disorder, bipolar disorder (depressed, moderate), and PTSD who presents to [facility] for follow up and medication management.  Patient is currently being managed on the following psychiatric medications  Hydroxyzine  25 mg 2 to 4 capsules by mouth (50 to 100 mg total) 2 times daily Escitalopram  5 mg daily Lamotrigine  100 mg 2 times daily Caplyta  42 mg daily Buspirone  30 mg 2 times daily  Patient presents to the encounter stating that her depression has been an issue lately.  She attributes her depression to ruminating thoughts over the loss of her father who passed away in 10/18/2015.  She reports that these ruminating thoughts have been ongoing for the past 2 months.  Patient rates her depression a 7 out of 10 with 10 being most severe.  Patient endorses depressive episodes 3 to 4 days/week.  Patient endorses the following depressive symptoms: feelings of sadness, lack of motivation, decreased concentration, decreased energy, irritability, feelings of guilt/worthlessness, and hopelessness.  Patient also endorses anxiety and rates her anxiety an 8 out of 10.  Patient denies any other stressors at this time.  A PHQ-9 screen was performed with the patient scoring an 18.  A GAD-7 screen was also performed with the patient scoring a 14.  Patient is alert and oriented x 4, calm, cooperative, and fully engaged in conversation during the encounter.  Patient endorses irritable mood.  Patient exhibits depressed mood with congruent affect.  Patient denies suicidal or homicidal ideations.  She further denies auditory or visual hallucinations and does not  appear to be responding to internal/external stimuli.  Patient endorses fair sleep and receives on average 7 to 8 hours of sleep per night.  Patient reports that she often wakes up 1-2 times per night.  Patient endorses good appetite and eats on average 2-3 meals as well as snacking in between.  Patient denies alcohol consumption, tobacco use, or illicit drug use.  Visit Diagnosis:    ICD-10-CM   1. GAD (generalized anxiety disorder)  F41.1 escitalopram  (LEXAPRO ) 10 MG tablet    hydrOXYzine  (VISTARIL ) 25 MG capsule    busPIRone  (BUSPAR ) 30 MG tablet    2. PTSD (post-traumatic stress disorder)  F43.10 escitalopram  (LEXAPRO ) 10 MG tablet    3. Bipolar 1 disorder, depressed, moderate (HCC)  F31.32 lumateperone  tosylate (CAPLYTA ) 42 MG capsule    lamoTRIgine  (LAMICTAL ) 100 MG tablet    4. Long term current use of antipsychotic medication  Z79.899       Past Psychiatric History:  Patient endorses a past psychiatric history significant for bipolar disorder (bipolar 1 versus bipolar 2), generalized anxiety disorder, depression, and PTSD   Patient denies a past history of hospitalization due to mental health   Patient denies a past history of suicide attempt   Patient denies a past history of homicide attempt  Past Medical History:  Past Medical History:  Diagnosis Date   Abnormal Pap smear    Anemia 02/21/2012   Arthritis    Asthma    Bronchitis    Chronic hypertension with superimposed preeclampsia 06/01/2012   Diabetes mellitus (HCC) 02/21/2012   Fibroid    GBS (group B streptococcus) UTI complicating pregnancy 06/04/2012  Needs treatment in labor-resistant to Clinda and Erythromycin!     GERD (gastroesophageal reflux disease)    HTN (hypertension) 02/21/2012   Migraines 02/21/2012   Uterine fibroids affecting pregnancy, antepartum 07/27/2012    Past Surgical History:  Procedure Laterality Date   CESAREAN SECTION N/A 10/29/2012   Procedure: CESAREAN SECTION;  Surgeon: Vonn VEAR Inch, MD;  Location: [facility];  Service: Obstetrics;  Laterality: N/A;   CHOLECYSTECTOMY  09/27/2001    Family Psychiatric History:  Uncle - schizophrenia, patient reports that he was taking lithium   Family history of suicide attempts: Patient denies Family history of homicide attempts: Patient denies Family history of substance abuse: Patient reports that her son uses marijuana  Family History:  Family History  Problem Relation Age of Onset   Heart disease Father    Coronary artery disease Father    Cancer Paternal Uncle        throat   Diabetes Maternal Grandmother    Cancer Maternal Grandfather        prostate   Cancer Paternal Grandfather        lung    Social History:  Social History   Socioeconomic History   Marital status: Single    Spouse name: Not on file   Number of children: 3   Years of education: Not on file   Highest education level: Not on file  Occupational History   Not on file  Tobacco Use   Smoking status: Never   Smokeless tobacco: Never  Vaping Use   Vaping status: Never Used  Substance and Sexual Activity   Alcohol use: Not Currently    Comment: occasional   Drug use: No   Sexual activity: Not Currently    Birth control/protection: None  Other Topics Concern   Not on file  Social History Narrative   Not on file   Social Drivers of Health   Tobacco Use: Low Risk (09/23/2024)   Patient History    Smoking Tobacco Use: Never    Smokeless Tobacco Use: Never    Passive Exposure: Not on file  Financial Resource Strain: Medium Risk (02/16/2024)   Received from [facility]   Overall Financial Resource Strain (CARDIA)    Difficulty of Paying Living Expenses: Somewhat hard  Food Insecurity: Food Insecurity Present (02/16/2024)   Received from [facility]   Epic    Within the past 12 months, you worried that your food would run out before you got the money to buy more.: Often true    Within the past 12 months, the food you bought just  didn't last and you didn't have money to get more.: Sometimes true  Transportation Needs: No Transportation Needs (02/16/2024)   Received from [facility] - [facility]    Lack of Transportation (Medical): No    Lack of Transportation (Non-Medical): No  Physical Activity: Inactive (02/16/2024)   Received from [facility]   Exercise Vital Sign    On average, how many days per week do you engage in moderate to strenuous exercise (like a brisk walk)?: 0 days    On average, how many minutes do you engage in exercise at this level?: 0 min  Stress: Stress Concern Present (02/16/2024)   Received from [facility] - Occupational Stress Questionnaire    Feeling of Stress : Rather much  Social Connections: Socially Integrated (02/16/2024)   Received from [facility]   Social Network  How would you rate your social network (family, work, friends)?: Good participation with social networks  Depression (PHQ2-9): High Risk (09/23/2024)   Depression (PHQ2-9)    PHQ-2 Score: 18  Alcohol Screen: Low Risk (12/11/2022)   Alcohol Screen    Last Alcohol Screening Score (AUDIT): 0  Housing: High Risk (02/16/2024)   Received from [facility]    In the last 12 months, was there a time when you were not able to pay the mortgage or rent on time?: No    In the past 12 months, how many times have you moved where you were living?: 3    At any time in the past 12 months, were you homeless or living in a shelter (including now)?: Yes  Utilities: Not At Risk (02/16/2024)   Received from [facility] Utilities    Threatened with loss of utilities: No  Health Literacy: Not on file    Allergies:  Allergies  Allergen Reactions   Shrimp [Shellfish Allergy] Anaphylaxis and Hives   Hydrocodone  Hives   Penicillins Hives and Other (See Comments)    Has patient had a PCN reaction causing immediate rash, facial/tongue/throat swelling, SOB or  lightheadedness with hypotension: No Has patient had a PCN reaction causing severe rash involving mucus membranes or skin necrosis: No Has patient had a PCN reaction that required hospitalization No Has patient had a PCN reaction occurring within the last 10 years: No If all of the above answers are NO, then may proceed with Cephalosporin use.   Porcine (Pork) Protein-Containing Drug Products Other (See Comments)    Reaction:  Increases pts BP    Metabolic Disorder Labs: Lab Results  Component Value Date   HGBA1C 5.9 (H) 03/17/2023   MPG 122.63 03/17/2023   MPG 120 (H) 06/01/2012   No results found for: PROLACTIN No results found for: CHOL, TRIG, HDL, CHOLHDL, VLDL, LDLCALC Lab Results  Component Value Date   TSH 0.745 03/17/2023   TSH 0.667 06/01/2012    Therapeutic Level Labs: No results found for: LITHIUM No results found for: VALPROATE No results found for: CBMZ  Current Medications: Current Outpatient Medications  Medication Sig Dispense Refill   albuterol  (VENTOLIN  HFA) 108 (90 Base) MCG/ACT inhaler Inhale 2 puffs into the lungs every 6 (six) hours as needed for shortness of breath.     amLODipine  (NORVASC ) 5 MG tablet Take 5 mg by mouth daily.     aspirin  EC (ASPIRIN  LOW DOSE) 81 MG tablet Take 1 tablet (81 mg total) by mouth daily. PT. MUST MAKE AN APPOINTMENT IN ORDER TO RECEIVE ADDITIONAL REFILLS, THIRD AND FINAL ATTEMPT. 7 tablet 0   busPIRone  (BUSPAR ) 30 MG tablet Take 1 tablet (30 mg total) by mouth 2 (two) times daily. 60 tablet 1   clonazePAM  (KLONOPIN ) 0.5 MG tablet Take 2 tablets (1 mg total) by mouth 2 (two) times daily as needed for anxiety. 60 tablet 0   empagliflozin  (JARDIANCE ) 10 MG TABS tablet Take 1 tablet (10 mg total) by mouth daily before breakfast. 30 tablet 0   EPINEPHrine  (EPIPEN ) 0.3 mg/0.3 mL DEVI Inject 0.3 mLs (0.3 mg total) into the muscle as needed. Use as directed in the event of a severe allergic reaction 1 Device 3    escitalopram  (LEXAPRO ) 10 MG tablet Take 1 tablet (10 mg total) by mouth daily. 30 tablet 1   gabapentin  (NEURONTIN ) 300 MG capsule Take 300 mg by mouth 3 (three) times daily.  hydrOXYzine  (VISTARIL ) 25 MG capsule Take 2-4 capsules (50-100 mg total) by mouth 2 (two) times daily as needed for anxiety. 60 capsule 1   labetalol  (NORMODYNE ) 300 MG tablet Take 300 mg by mouth 2 (two) times daily.     lamoTRIgine  (LAMICTAL ) 100 MG tablet Take 1 tablet (100 mg total) by mouth 2 (two) times daily. 60 tablet 1   losartan (COZAAR) 25 MG tablet Take 12.5 mg by mouth 2 (two) times daily.     lumateperone  tosylate (CAPLYTA ) 42 MG capsule Take 1 capsule (42 mg total) by mouth daily. 30 capsule 0   meclizine (ANTIVERT) 25 MG tablet Take 1 tablet by mouth as needed.     metFORMIN  (GLUCOPHAGE ) 500 MG tablet Take 1 tablet (500 mg total) by mouth 2 (two) times daily with a meal. (Patient taking differently: Take 0.5 tablets by mouth 2 (two) times daily with a meal.) 60 tablet 0   omeprazole  (PRILOSEC) 20 MG capsule Take 20 mg by mouth 2 (two) times daily.     prochlorperazine  (COMPAZINE ) 5 MG tablet Take 1 tablet (5 mg total) by mouth every 6 (six) hours as needed for nausea or vomiting. 15 tablet 0   rosuvastatin  (CRESTOR ) 5 MG tablet Take 1 tablet (5 mg total) by mouth daily. 30 tablet 3   tiZANidine (ZANAFLEX) 4 MG tablet Take 4 mg by mouth every 6 (six) hours as needed for muscle spasms.     valbenazine  (INGREZZA ) 40 MG capsule Take 1 capsule (40 mg total) by mouth daily. 30 capsule 1   No current facility-administered medications for this visit.     Musculoskeletal: Strength & Muscle Tone: within normal limits Gait & Station: normal Patient leans: N/A  Psychiatric Specialty Exam: Review of Systems  Psychiatric/Behavioral:  Positive for dysphoric mood and sleep disturbance. Negative for decreased concentration, hallucinations, self-injury and suicidal ideas. The patient is nervous/anxious. The patient  is not hyperactive.     Blood pressure (!) 153/81, pulse 97, height 5' 6 (1.676 m), weight 236 lb 9.6 oz (107.3 kg), last menstrual period 04/02/2017, SpO2 96%, unknown if currently breastfeeding.Body mass index is 38.19 kg/m.  General Appearance: Casual  Eye Contact:  Good  Speech:  Clear and Coherent and Normal Rate  Volume:  Normal  Mood:  Anxious and Depressed  Affect:  Congruent  Thought Process:  Coherent, Goal Directed, and Descriptions of Associations: Intact  Orientation:  Full (Time, Place, and Person)  Thought Content: WDL   Suicidal Thoughts:  No  Homicidal Thoughts:  No  Memory:  Immediate;   Good Recent;   Good Remote;   Fair  Judgement:  Good  Insight:  Good  Psychomotor Activity:  Normal  Concentration:  Concentration: Good and Attention Span: Good  Recall:  Good  Fund of Knowledge: Good  Language: Good  Akathisia:  No  Handed:  Right  AIMS (if indicated): done; 6  Assets:  Communication Skills Desire for Improvement Social Support  ADL's:  Intact  Cognition: WNL  Sleep:  Fair   Screenings: AIMS    Flowsheet Row Clinical Support from 09/23/2024 in [facility] Clinical Support from 06/09/2024 in [facility] Clinical Support from 05/05/2024 in [facility] Clinical Support from 03/23/2024 in [facility] Clinical Support from 02/03/2024 in [facility]  AIMS Total Score 0 2 4 6 6    GAD-7    Flowsheet Row Clinical Support from 09/23/2024 in [facility] Clinical Support from 06/09/2024 in [facility] Clinical Support from 05/05/2024 in [facility] Clinical Support from 03/23/2024 in [facility] Clinical Support from 02/03/2024 in [facility]  Total GAD-7 Score 14 20 18 15 20    PHQ2-9     Flowsheet Row Clinical Support from 09/23/2024 in [facility] Clinical Support from 06/09/2024 in [facility] Clinical Support from 05/05/2024 in [facility] Clinical Support from 03/23/2024 in [facility] Clinical Support from 02/03/2024 in [facility]  PHQ-2 Total Score 5 5 6 6 6   PHQ-9 Total Score 18 16 21 17 23    Flowsheet Row Clinical Support from 09/23/2024 in [facility] Clinical Support from 06/09/2024 in [facility] Clinical Support from 05/05/2024 in [facility]  C-SSRS RISK CATEGORY No Risk No Risk No Risk     Assessment and Plan:   Carla Brooks is a 54 year old, African-American female with a past psychiatric history significant for generalized anxiety disorder, bipolar disorder (depressed, moderate), and PTSD who presents to [facility] for follow up and medication management.  Patient presents to the encounter stating that she has been taking her medications regularly and denies experiencing any adverse side effects.  An aims assessment was performed with the patient scoring a 0.  Patient reports that she has been experiencing worsening depression attributed to ruminating over the passing of her father.  She also endorses elevated anxiety.  Patient denies any other stressors at this time.  A PHQ-9 screen was performed with the patient scoring an 18.  A GAD-7 screen was also performed with the patient scoring a 14.  Provider recommended adjusting patient's Lexapro  dosage from 5 mg to 10 mg daily for the management of her depressive symptoms and anxiety.  Patient was agreeable to recommendation.  Patient to continue taking all other medications as prescribed.  Patient's medications to be e-prescribed to pharmacy of  choice.  Patient's last EKG was performed on 05/08/2023.  Patient's QTc was 432 ms.  Provider to to schedule a future EKG appointment for the patient during her next assessment.  A [place] Suicide Severity Rating Scale was performed with the patient being considered no risk.  Patient denies suicidal ideations and is able to contract for safety at this time.  Collaboration of Care: Collaboration of Care: Medication Management AEB patient being followed by mental health provider at this facility, Psychiatrist AEB provider managing patient's psychiatric medications, and Referral or follow-up with counselor/therapist AEB patient being seen by a licensed clinical social worker at this facility  Patient/Guardian was advised Release of Information must be obtained prior to any record release in order to collaborate their care with an outside provider. Patient/Guardian was advised if they have not already done so to contact the registration department to sign all necessary forms in order for us  to release information regarding their care.   Consent: Patient/Guardian gives verbal consent for treatment and assignment of benefits for services provided during this visit. Patient/Guardian expressed understanding and agreed to proceed.   1. GAD (generalized anxiety disorder) (Primary)  - escitalopram  (LEXAPRO ) 10 MG tablet; Take 1 tablet (10 mg total) by mouth daily.  Dispense: 30 tablet; Refill: 1 - hydrOXYzine  (VISTARIL ) 25 MG capsule; Take 2-4 capsules (50-100 mg total) by mouth 2 (two) times daily as  needed for anxiety.  Dispense: 60 capsule; Refill: 1 - busPIRone  (BUSPAR ) 30 MG tablet; Take 1 tablet (30 mg total) by mouth 2 (two) times daily.  Dispense: 60 tablet; Refill: 1  2. PTSD (post-traumatic stress disorder)  - escitalopram  (LEXAPRO ) 10 MG tablet; Take 1 tablet (10 mg total) by mouth daily.  Dispense: 30 tablet; Refill: 1  3. Bipolar 1 disorder, depressed, moderate (HCC)  - lumateperone  tosylate  (CAPLYTA ) 42 MG capsule; Take 1 capsule (42 mg total) by mouth daily.  Dispense: 30 capsule; Refill: 0 - lamoTRIgine  (LAMICTAL ) 100 MG tablet; Take 1 tablet (100 mg total) by mouth 2 (two) times daily.  Dispense: 60 tablet; Refill: 1  4. Long term current use of antipsychotic medication   Patient to follow-up in 7 weeks Provider spent a total of 18 minutes with the patient/reviewing patient's chart  Reginia FORBES Bolster, PA 09/23/2024, 11:30 AM

## 2024-11-11 ENCOUNTER — Encounter (HOSPITAL_COMMUNITY): Payer: MEDICAID | Admitting: Physician Assistant
